# Patient Record
Sex: Female | Born: 1940 | Race: White | Hispanic: No | Marital: Married | Smoking: Never smoker
Health system: Southern US, Community
[De-identification: ages and names within clinical notes are randomized; demographics above are authoritative.]

## PROBLEM LIST (undated history)

## (undated) DIAGNOSIS — K219 Gastro-esophageal reflux disease without esophagitis: Secondary | ICD-10-CM

## (undated) DIAGNOSIS — M1990 Unspecified osteoarthritis, unspecified site: Secondary | ICD-10-CM

## (undated) DIAGNOSIS — E785 Hyperlipidemia, unspecified: Secondary | ICD-10-CM

## (undated) DIAGNOSIS — I10 Essential (primary) hypertension: Secondary | ICD-10-CM

## (undated) DIAGNOSIS — N6019 Diffuse cystic mastopathy of unspecified breast: Secondary | ICD-10-CM

## (undated) HISTORY — PX: BREAST EXCISIONAL BIOPSY: SUR124

## (undated) HISTORY — PX: BREAST BIOPSY: SHX20

## (undated) HISTORY — PX: EYE SURGERY: SHX253

## (undated) HISTORY — PX: APPENDECTOMY: SHX54

---

## 2004-10-16 ENCOUNTER — Ambulatory Visit: Payer: Self-pay | Admitting: Internal Medicine

## 2006-11-06 ENCOUNTER — Ambulatory Visit: Payer: Self-pay | Admitting: Internal Medicine

## 2006-11-28 ENCOUNTER — Ambulatory Visit: Payer: Self-pay | Admitting: Internal Medicine

## 2007-05-29 ENCOUNTER — Ambulatory Visit: Payer: Self-pay | Admitting: Internal Medicine

## 2008-05-30 ENCOUNTER — Ambulatory Visit: Payer: Self-pay | Admitting: Internal Medicine

## 2010-07-25 ENCOUNTER — Ambulatory Visit: Payer: Self-pay | Admitting: Internal Medicine

## 2011-08-16 ENCOUNTER — Ambulatory Visit: Payer: Self-pay | Admitting: Internal Medicine

## 2012-06-29 ENCOUNTER — Ambulatory Visit: Payer: Self-pay | Admitting: Internal Medicine

## 2012-07-09 ENCOUNTER — Ambulatory Visit: Payer: Self-pay | Admitting: Internal Medicine

## 2012-07-15 ENCOUNTER — Ambulatory Visit: Payer: Self-pay | Admitting: Internal Medicine

## 2012-08-20 ENCOUNTER — Ambulatory Visit: Payer: Self-pay | Admitting: Internal Medicine

## 2013-08-23 ENCOUNTER — Ambulatory Visit: Payer: Self-pay | Admitting: Family Medicine

## 2014-08-24 ENCOUNTER — Ambulatory Visit: Payer: Self-pay | Admitting: Family Medicine

## 2015-06-01 ENCOUNTER — Other Ambulatory Visit: Payer: Self-pay | Admitting: Unknown Physician Specialty

## 2015-06-01 DIAGNOSIS — M25362 Other instability, left knee: Secondary | ICD-10-CM

## 2015-06-01 DIAGNOSIS — M25361 Other instability, right knee: Secondary | ICD-10-CM

## 2015-06-01 DIAGNOSIS — M25561 Pain in right knee: Secondary | ICD-10-CM

## 2015-06-01 DIAGNOSIS — M25562 Pain in left knee: Principal | ICD-10-CM

## 2015-06-08 ENCOUNTER — Ambulatory Visit
Admission: RE | Admit: 2015-06-08 | Discharge: 2015-06-08 | Disposition: A | Discharge status: Home / Self Care | Payer: Medicare Other | Source: Ambulatory Visit | Attending: Unknown Physician Specialty | Admitting: Unknown Physician Specialty

## 2015-06-08 DIAGNOSIS — M1712 Unilateral primary osteoarthritis, left knee: Secondary | ICD-10-CM | POA: Diagnosis not present

## 2015-06-08 DIAGNOSIS — M23322 Other meniscus derangements, posterior horn of medial meniscus, left knee: Secondary | ICD-10-CM | POA: Insufficient documentation

## 2015-06-08 DIAGNOSIS — M1711 Unilateral primary osteoarthritis, right knee: Secondary | ICD-10-CM | POA: Diagnosis not present

## 2015-06-08 DIAGNOSIS — M7122 Synovial cyst of popliteal space [Baker], left knee: Secondary | ICD-10-CM | POA: Diagnosis not present

## 2015-06-08 DIAGNOSIS — M25562 Pain in left knee: Secondary | ICD-10-CM

## 2015-06-08 DIAGNOSIS — M25561 Pain in right knee: Secondary | ICD-10-CM | POA: Diagnosis present

## 2015-06-08 DIAGNOSIS — S83242A Other tear of medial meniscus, current injury, left knee, initial encounter: Secondary | ICD-10-CM | POA: Insufficient documentation

## 2015-06-08 DIAGNOSIS — M25361 Other instability, right knee: Secondary | ICD-10-CM

## 2015-06-08 DIAGNOSIS — M23300 Other meniscus derangements, unspecified lateral meniscus, right knee: Secondary | ICD-10-CM | POA: Diagnosis not present

## 2015-06-08 DIAGNOSIS — S83282A Other tear of lateral meniscus, current injury, left knee, initial encounter: Secondary | ICD-10-CM | POA: Diagnosis not present

## 2015-06-08 DIAGNOSIS — M25362 Other instability, left knee: Secondary | ICD-10-CM

## 2015-08-22 ENCOUNTER — Other Ambulatory Visit: Payer: Self-pay | Admitting: Family Medicine

## 2015-08-22 DIAGNOSIS — Z1231 Encounter for screening mammogram for malignant neoplasm of breast: Secondary | ICD-10-CM

## 2015-08-28 ENCOUNTER — Ambulatory Visit
Admission: RE | Admit: 2015-08-28 | Discharge: 2015-08-28 | Disposition: A | Discharge status: Home / Self Care | Payer: Medicare Other | Source: Ambulatory Visit | Attending: Family Medicine | Admitting: Family Medicine

## 2015-08-28 DIAGNOSIS — Z1231 Encounter for screening mammogram for malignant neoplasm of breast: Secondary | ICD-10-CM | POA: Diagnosis not present

## 2016-08-09 ENCOUNTER — Other Ambulatory Visit: Payer: Self-pay | Admitting: Family Medicine

## 2016-08-09 DIAGNOSIS — Z1231 Encounter for screening mammogram for malignant neoplasm of breast: Secondary | ICD-10-CM

## 2016-09-03 ENCOUNTER — Ambulatory Visit
Admission: RE | Admit: 2016-09-03 | Discharge: 2016-09-03 | Disposition: A | Discharge status: Home / Self Care | Payer: Medicare Other | Source: Ambulatory Visit | Attending: Family Medicine | Admitting: Family Medicine

## 2016-09-03 DIAGNOSIS — Z1231 Encounter for screening mammogram for malignant neoplasm of breast: Secondary | ICD-10-CM | POA: Diagnosis not present

## 2017-08-26 ENCOUNTER — Other Ambulatory Visit: Payer: Self-pay | Admitting: Family Medicine

## 2017-08-26 DIAGNOSIS — Z1231 Encounter for screening mammogram for malignant neoplasm of breast: Secondary | ICD-10-CM

## 2017-09-11 ENCOUNTER — Ambulatory Visit
Admission: RE | Admit: 2017-09-11 | Discharge: 2017-09-11 | Disposition: A | Discharge status: Home / Self Care | Payer: Medicare Other | Source: Ambulatory Visit | Attending: Family Medicine | Admitting: Family Medicine

## 2017-09-11 DIAGNOSIS — Z1231 Encounter for screening mammogram for malignant neoplasm of breast: Secondary | ICD-10-CM

## 2018-04-21 ENCOUNTER — Ambulatory Visit
Admission: RE | Admit: 2018-04-21 | Discharge: 2018-04-21 | Disposition: A | Discharge status: Home / Self Care | Payer: Medicare Other | Source: Ambulatory Visit | Attending: Internal Medicine | Admitting: Internal Medicine

## 2018-04-21 ENCOUNTER — Encounter
Admission: RE | Disposition: A | Discharge status: Home / Self Care | Payer: Self-pay | Source: Ambulatory Visit | Attending: Internal Medicine

## 2018-04-21 ENCOUNTER — Encounter: Payer: Self-pay | Admitting: *Deleted

## 2018-04-21 DIAGNOSIS — E785 Hyperlipidemia, unspecified: Secondary | ICD-10-CM | POA: Insufficient documentation

## 2018-04-21 DIAGNOSIS — Z882 Allergy status to sulfonamides status: Secondary | ICD-10-CM | POA: Diagnosis not present

## 2018-04-21 DIAGNOSIS — Z79899 Other long term (current) drug therapy: Secondary | ICD-10-CM | POA: Diagnosis not present

## 2018-04-21 DIAGNOSIS — I4891 Unspecified atrial fibrillation: Secondary | ICD-10-CM | POA: Diagnosis not present

## 2018-04-21 DIAGNOSIS — I10 Essential (primary) hypertension: Secondary | ICD-10-CM | POA: Diagnosis not present

## 2018-04-21 DIAGNOSIS — I639 Cerebral infarction, unspecified: Secondary | ICD-10-CM | POA: Insufficient documentation

## 2018-04-21 DIAGNOSIS — Z7982 Long term (current) use of aspirin: Secondary | ICD-10-CM | POA: Insufficient documentation

## 2018-04-21 DIAGNOSIS — K219 Gastro-esophageal reflux disease without esophagitis: Secondary | ICD-10-CM | POA: Diagnosis not present

## 2018-04-21 DIAGNOSIS — Z881 Allergy status to other antibiotic agents status: Secondary | ICD-10-CM | POA: Insufficient documentation

## 2018-04-21 HISTORY — DX: Essential (primary) hypertension: I10

## 2018-04-21 HISTORY — DX: Diffuse cystic mastopathy of unspecified breast: N60.19

## 2018-04-21 HISTORY — DX: Gastro-esophageal reflux disease without esophagitis: K21.9

## 2018-04-21 HISTORY — PX: TEE WITHOUT CARDIOVERSION: SHX5443

## 2018-04-21 HISTORY — DX: Hyperlipidemia, unspecified: E78.5

## 2018-04-21 HISTORY — DX: Unspecified osteoarthritis, unspecified site: M19.90

## 2018-04-21 SURGERY — ECHOCARDIOGRAM, TRANSESOPHAGEAL
Anesthesia: Moderate Sedation

## 2018-04-21 MED ORDER — LIDOCAINE VISCOUS HCL 2 % MT SOLN
OROMUCOSAL | Status: AC
  Filled 2018-04-21: qty 15

## 2018-04-21 MED ORDER — FENTANYL CITRATE (PF) 100 MCG/2ML IJ SOLN
INTRAMUSCULAR | Status: AC | PRN
  Administered 2018-04-21 (×2): 25 ug via INTRAVENOUS

## 2018-04-21 MED ORDER — MIDAZOLAM HCL 5 MG/5ML IJ SOLN
INTRAMUSCULAR | Status: AC | PRN
  Administered 2018-04-21: 2 mg via INTRAVENOUS

## 2018-04-21 MED ORDER — MIDAZOLAM HCL 5 MG/5ML IJ SOLN
INTRAMUSCULAR | Status: AC
  Filled 2018-04-21: qty 10

## 2018-04-21 MED ORDER — SODIUM CHLORIDE 0.9 % IV SOLN
INTRAVENOUS | Status: DC
  Administered 2018-04-21: 07:00:00 via INTRAVENOUS

## 2018-04-21 MED ORDER — FENTANYL CITRATE (PF) 100 MCG/2ML IJ SOLN
INTRAMUSCULAR | Status: AC
  Filled 2018-04-21: qty 4

## 2018-04-21 MED ORDER — SODIUM CHLORIDE FLUSH 0.9 % IV SOLN
INTRAVENOUS | Status: AC
  Filled 2018-04-21: qty 10

## 2018-04-21 MED ORDER — BUTAMBEN-TETRACAINE-BENZOCAINE 2-2-14 % EX AERO
INHALATION_SPRAY | CUTANEOUS | Status: AC
  Filled 2018-04-21: qty 5

## 2018-04-21 NOTE — Progress Notes (Signed)
*  PRELIMINARY RESULTS* Echocardiogram Echocardiogram Transesophageal has been performed.  Adriana Hughes, Adriana Hughes 04/21/2018, 8:18 AM

## 2018-04-21 NOTE — CV Procedure (Signed)
TEE Patient underwent transesophageal echocardiogram for reasons of cryptogenic stroke Patient had normal LV systolic function without evidence of significant valvular heart disease thrombus thickening calcification or other source of stroke.  The patient did not have any atrial septal defect or patent foramen ovale.  There was mild aortic atherosclerosis Patient tolerated medical treatment and procedure well without complication  Arnoldo HookerBruce Teddie Mehta

## 2018-06-11 ENCOUNTER — Ambulatory Visit: Payer: Medicare Other | Attending: Nurse Practitioner | Admitting: Speech Pathology

## 2018-06-11 ENCOUNTER — Encounter: Payer: Self-pay | Admitting: Speech Pathology

## 2018-06-11 ENCOUNTER — Encounter: Payer: Medicare Other | Admitting: Speech Pathology

## 2018-06-11 DIAGNOSIS — R490 Dysphonia: Secondary | ICD-10-CM

## 2018-06-11 DIAGNOSIS — R4701 Aphasia: Secondary | ICD-10-CM | POA: Diagnosis not present

## 2018-06-11 DIAGNOSIS — R262 Difficulty in walking, not elsewhere classified: Secondary | ICD-10-CM | POA: Insufficient documentation

## 2018-06-11 DIAGNOSIS — M6281 Muscle weakness (generalized): Secondary | ICD-10-CM | POA: Diagnosis present

## 2018-06-11 DIAGNOSIS — I63512 Cerebral infarction due to unspecified occlusion or stenosis of left middle cerebral artery: Secondary | ICD-10-CM | POA: Insufficient documentation

## 2018-06-13 NOTE — Therapy (Signed)
[facility] MAIN [facility] SERVICES [address] [place], [place], [address] Phone: [phone]   Fax:  [phone]  Speech Language Pathology Evaluation  Patient Details  Name: ALYZAE HAWKEY MRN: [id number] Date of Birth: 05/13/1941 Referring Provider: Clearence Cheek, NP   Encounter Date: 06/11/2018  End of Session - 06/13/18 0745    Visit Number  1    Number of Visits  17    Date for SLP Re-Evaluation  07/30/18    SLP Start Time  1600    SLP Stop Time   1655    SLP Time Calculation (min)  55 min    Activity Tolerance  Patient tolerated treatment well       Past Medical History:  Diagnosis Date  . Arthritis   . Fibrocystic breast disease   . GERD (gastroesophageal reflux disease)   . Hyperlipidemia   . Hypertension     Past Surgical History:  Procedure Laterality Date  . APPENDECTOMY    . BREAST BIOPSY    . BREAST EXCISIONAL BIOPSY Bilateral 1324,4010   benign  . EYE SURGERY    . TEE WITHOUT CARDIOVERSION N/A 04/21/2018   Procedure: TRANSESOPHAGEAL ECHOCARDIOGRAM (TEE);  Surgeon: Lamar Blinks, MD;  Location: [facility] ORS;  Service: Cardiovascular;  Laterality: N/A;    There were no vitals filed for this visit.  Subjective Assessment - 06/13/18 0725    Subjective  Patient reported she is doing well today, smiling often throughout evaluation    Patient is accompained by:  Family member    Currently in Pain?  No/denies         SLP Evaluation [facility] - 06/13/18 0725      SLP Visit Information   SLP Received On  06/13/18    Referring Provider  Clearence Cheek, NP    Onset Date  --   03/17/18   Medical Diagnosis  CVA      General Information   HPI  Per discharge summary from [facility]: Ischemic Stroke s/p alteplase and IA:  Presented initially with symptoms on arrival notable for left gaze deviation, right sided hemiparesis, hemianopia, and right facial droop. Primary stroke risk factors: hypertension. She was found to be a candidate  for tPa that she received on 5/15. She was also a candidate for IA and brought in for successful mechanical thrombectomy with good restoration of blood flow after one pass, TICI 3. Patient monitored in the ICU initially and required intubation due to frequent vomiting and concern for airway protection. She was successfully extubated on 5/16 and transferred to the floor without issues. Risk stratification obtained while inpatient as follows: LDL: 126, A1C: 5.8, TTE reveals EF >55% without evidence of intracardiac thrombus. 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 Post tPA [facility] 5/15 showed interval left basal ganglia hyperdensity, so repeat CTH ordered on 5/16 showing resolution of finding which likely represented contrast staining from IA procedure      Prior Functional Status   Cognitive/Linguistic Baseline  Within functional limits      Cognition   Overall Cognitive Status  Within Functional Limits for tasks assessed      Auditory Comprehension   Overall Auditory Comprehension  Appears within functional limits for tasks assessed    Yes/No Questions  Within Functional Limits    Commands  Within Functional Limits    Conversation  Moderately complex      Visual Recognition/Discrimination  Discrimination  Within Function Limits      Reading Comprehension   Reading Status  Within funtional limits   for tasks admininsterd     Expression   Primary Mode of Expression  Verbal      Verbal Expression   Overall Verbal Expression  Impaired    Initiation  No impairment    Automatic Speech  Name;Social Response;Counting    Level of Generative/Spontaneous Verbalization  Phrase;Sentence    Repetition  No impairment    Naming  Impairment    Responsive  76-100% accurate    Confrontation  75-100% accurate    Divergent  0-24% accurate    Other Naming Comments  Noted frequent semantic paraphasias in conversational speech and in  answers to wh questions    Verbal Errors  Semantic paraphasias;Inconsistent;Other (comment)   inconsistent self ID of errors   Pragmatics  No impairment    Effective Techniques  Semantic cues;Sentence completion      Written Expression   Written Expression  Exceptions to [employer]    Self Formulation Ability  Phrase    Interfering Components  Thought organization    Effective Techniques  Verbal/Language cues;Tactile/Visual cues    Overall Writen Expression  Moderate deficits with organization, missing articles misspelled words; word finding difficulties & semantic paraphasias evident in short phrases written to describe actions in "[employer]" picture      Oral Motor/Sensory Function   Overall Oral Motor/Sensory Function  Appears within functional limits for tasks assessed      Motor Speech   Overall Motor Speech  Impaired    Respiration  Impaired    Level of Impairment  Sentence    Phonation  Hoarse;Low vocal intensity    Resonance  Within functional limits    Articulation  Within functional limitis    Intelligibility  Intelligible    Motor Planning  Witnin functional limits      Standardized Assessments   Standardized Assessments   Western Aphasia Battery revised    Western Aphasia Battery revised   Aphasia Quotient 71.6       Western Aphasia Battery- Revised  Spontaneous Speech      Information content  8/10       Fluency   9/10     Comprehension     Yes/No questions  60/60        Auditory Word Recognition 60/60        Sequential Commands 80/80    Repetition   94/100     Naming    Object Naming  60/60        Word Fluency     4/20        Sentence Completion   10/10        Responsive Speech   10/10     Aphasia Quotient  71.6/100       SLP Education - 06/13/18 0743    Education Details  re: role of SLP in Aphasia treatment; word finding strategies, resonant voice exercises    Person(s) Educated  Patient;Child(ren)    Methods  Explanation;Verbal cues    Comprehension   Verbalized understanding;Need further instruction;Verbal cues required         SLP Long Term Goals - 06/13/18 0747      SLP LONG TERM GOAL #1   Title  Patient will describe pictured objects by naming 3 semantic features with 80% accuracy given min verbal cues.    Time  8    Period  Weeks  Status  New      SLP LONG TERM GOAL #2   Title  Patient will name 10 members in a given category with 80% accuracy given min verbal cues.    Time  8    Period  Weeks    Status  New      SLP LONG TERM GOAL #3   Title  Patient will compose a novel sentence when given 1-2 target words with 80% accuracy independently.    Time  8    Period  Weeks    Status  New      SLP LONG TERM GOAL #4   Title  Patient will give 3 semantic descriptors when having word finding difficulty in conversation with 100% accuracy given min verbal cues.    Time  8    Period  Weeks    Status  New      SLP LONG TERM GOAL #5   Title  Patient will be independent with resonant voice exercises.    Time  8    Period  Weeks    Status  New       Plan - 06/13/18 0746    Clinical Impression Statement  Patient presents with moderate expressive aphasia characterized by word finding and organization deficits at the phrase level. Spontaneous speech composed of largely phrases or short sentences with intermittent semantic paraphasias. Patient exhibits no difficulty with confrontation naming tasks presented on evaluation; however word finding deficits become evident in response to open ended questions, divergent naming tasks and in simple written expression tasks. Receptive language appears WFL. No evidence of dysarthria or verbal apraxia. 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 Patient and daughter report onset of dysphonia s/p CVA with no prior history of dysphonia.  Recommend ENT evaluation  to further investigate dysphonia if dysphonia does not resolve with treatment.   Speech Therapy Frequency  2x / week    Duration  Other (comment)   8 weeks   Treatment/Interventions  Compensatory strategies;SLP instruction and feedback;Patient/family education;Functional tasks    Potential to Achieve Goals  Good    Potential Considerations  Ability to learn/carryover information;Family/community support;Cooperation/participation level    SLP Home Exercise Plan  Word finding and resonant voice exercises    Consulted and Agree with Plan of Care  Patient;Family member/caregiver    Family Member Consulted  Daughter       Patient will benefit from skilled therapeutic intervention in order to improve the following deficits and impairments:   Expressive aphasia  Dysphonia    Problem List There are no active problems to display for this patient.   [place], MA, CCC-SLP 06/13/2018, 8:09 AM  [facility] MAIN [facility] SERVICES [address] [place], [place], [address] Phone: [phone]   Fax:  [phone]  Name: VERMELLE CAMMARATA MRN: [id number] Date of Birth: [birth year]

## 2018-06-15 ENCOUNTER — Ambulatory Visit: Payer: Medicare Other

## 2018-06-15 ENCOUNTER — Encounter: Payer: Self-pay | Admitting: Physical Therapy

## 2018-06-15 ENCOUNTER — Other Ambulatory Visit: Payer: Self-pay

## 2018-06-15 ENCOUNTER — Ambulatory Visit: Payer: Medicare Other | Admitting: Physical Therapy

## 2018-06-15 ENCOUNTER — Ambulatory Visit: Payer: Medicare Other | Admitting: Speech Pathology

## 2018-06-15 DIAGNOSIS — R4701 Aphasia: Secondary | ICD-10-CM

## 2018-06-15 DIAGNOSIS — M6281 Muscle weakness (generalized): Secondary | ICD-10-CM

## 2018-06-15 DIAGNOSIS — R262 Difficulty in walking, not elsewhere classified: Secondary | ICD-10-CM

## 2018-06-15 NOTE — Patient Instructions (Signed)
Chair Pose    Stand with legs hip-width apart. Inhaling, bend knees trying to keep knees over ankles, as if to sit on a chair and extend arms in front, wrists slightly lower than shoulders. Hold position for _3__ breaths. Exhaling, stand up. Repeat _15__ times. Do _2__ times per day.  Copyright  VHI. All rights reserved.  Heel Raises    Stand with support. Tighten pelvic floor and hold. With knees straight, raise heels off ground. Repeat _20__ times. Do _2__ times a day.  Copyright  VHI. All rights reserved.  Hip Extension: Standing (Single Leg)    In shoulder width stance, anchor tubing under one foot. Twist and put around other ankle. Pull same leg back, keeping knee nearly straight. Repeat 15__ times per set. Repeat with other leg. Do _2_ sets per session. Do 7__ sessions per week.  http://tub.exer.us/194   Copyright  VHI. All rights reserved.

## 2018-06-15 NOTE — Therapy (Signed)
[facility] [address] [place], [place], [address] Phone: [phone]   Fax:  [phone]  Physical Therapy Evaluation  Patient Details  Name: Adriana Hughes MRN: [id number] Date of Birth: 1941-02-08 Referring Provider: Clearence CheekWILSON, SUSAN    Encounter Date: 06/15/2018  PT End of Session - 06/15/18 1658    Visit Number  1    Number of Visits  25    Date for PT Re-Evaluation  09/07/18    PT Start Time  0400    PT Stop Time  0455    PT Time Calculation (min)  55 min    Equipment Utilized During Treatment  Gait belt    Activity Tolerance  Patient tolerated treatment well;Patient limited by fatigue    Behavior During Therapy  [facility]FL for tasks assessed/performed       Past Medical History:  Diagnosis Date  . Arthritis   . Fibrocystic breast disease   . GERD (gastroesophageal reflux disease)   . Hyperlipidemia   . Hypertension     Past Surgical History:  Procedure Laterality Date  . APPENDECTOMY    . BREAST BIOPSY    . BREAST EXCISIONAL BIOPSY Bilateral [id number]   benign  . EYE SURGERY    . TEE WITHOUT CARDIOVERSION N/A 04/21/2018   Procedure: TRANSESOPHAGEAL ECHOCARDIOGRAM (TEE);  Surgeon: Lamar BlinksKowalski, Bruce J, MD;  Location: [facility] ORS;  Service: Cardiovascular;  Laterality: N/A;    There were no vitals filed for this visit.   Subjective Assessment - 06/15/18 1605    Subjective  Patient reports that she has decreasaed endurance and walks towards the right side.     Patient is accompained by:  Family member    Pertinent History  [facility]schem[facility] Stroke s/p alteplase and IA:  Presented initially with symptoms on arrival notable for left gaze deviation, right sided hemiparesis, hemianopia, and right facial droop. Primary stroke risk factors: hypertension. She was found to be a candidate for tPa that she received on 5/15. She was also a candidate for IA and brought in for successful mechanical  thrombectomy with good restoration of blood flow after one pass, TICI 3. Patient monitored in the ICU initially and required intubation due to frequent vomiting and concern for airway protection. She was successfully extubated on 5/16 and transferred to the floor without issues. Risk stratification obtained while inpatient as follows: LDL: 126, A1C: 5.8, TTE reveals EF >55% without evidence of intracardiac thrombus. 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 [facility] showed interval left basal ganglia hyperdensity, so repeat CTH ordered on 5/16 showing resolution of finding which likely represented contrast staining from IA procedure     How long can you stand comfortably?  no limits    Patient Stated Goals  Patient wants to be able to walk longer distances and not walk towards the right side.     Currently in Pain?  No/denies    Multiple Pain Sites  No         [facility] PT Assessment - 06/15/18 1607      Assessment   Medical Diagnosis  cva    Referring Provider  Andrey CampanileWILSON, [place]USAN     Onset Date/Surgical Date  03/17/18    Hand Dominance  Right    Prior Therapy  HHPT      Precautions   Precautions  None      Restrictions  Weight Bearing Restrictions  No      Balance Screen   Has the patient fallen in the past 6 months  No    Has the patient had a decrease in activity level because of a fear of falling?   Yes    Is the patient reluctant to leave their home because of a fear of falling?   No      Home Environment   Living Environment  Private residence    Living Arrangements  Spouse/significant other    Type of Home  House    Home Access  Stairs to enter    Entrance Stairs-Number of Steps  3    Entrance Stairs-Rails  Left    Home Equipment  Hand held shower head      Prior Function   Level of Independence  Independent with basic ADLs    Vocation  Retired    Leisure  --   wat,ch grand children, movies         POSTURE: WNL   PROM/AROM: WNL  STRENGTH:  Graded on a 0-5 scale Muscle Group Left Right                          Hip Flex 4/5 3+/5  Hip Abd 4/5 3+/5  Hip Add 3/5 2+/5  Hip Ext -2/5 -2/5  Hip IR/ER 3/5 3/5  Knee Flex 4/5 4/5  Knee Ext 4/5 4/5  Ankle DF 5/5 4/5  Ankle PF 4/5 3/5   SENSATION: WNL   FUNCTIONAL MOBILITY:independent transfers sit to stand ; independent bed mobility    BALANCE: Static Standing Balance  Normal Able to maintain standing balance against maximal resistance   Good Able to maintain standing balance against moderate resistance   Good-/Fair+ Able to maintain standing balance against minimal resistance x  Fair Able to stand unsupported without UE support and without LOB for 1-2 min   Fair- Requires Min A and UE support to maintain standing without loss of balance   Poor+ Requires mod A and UE support to maintain standing without loss of balance   Poor Requires max A and UE support to maintain standing balance without loss    Standing Dynamic Balance  Normal Stand independently unsupported, able to weight shift and cross midline maximally   Good Stand independently unsupported, able to weight shift and cross midline moderately   Good-/Fair+ Stand independently unsupported, able to weight shift across midline minimally x  Fair Stand independently unsupported, weight shift, and reach ipsilaterally, loss of balance when crossing midline   Poor+ Able to stand with Min A and reach ipsilaterally, unable to weight shift   Poor Able to stand with Mod A and minimally reach ipsilaterally, unable to cross midline.       GAIT: Patient ambulates with right path deviation with slow gait speed and no assistive device with feet crossing x 2 in 6 minutes  OUTCOME MEASURES: TEST Outcome Interpretation  5 times sit<>stand 14.84sec >60 yo, >15 sec indicates increased risk for falls  10 meter walk test   .82              m/s <1.0 m/s indicates increased risk  for falls; limited community ambulator  Timed up and Go      9.88           sec <14 sec indicates increased risk for falls  6 minute walk test 885  Feet 1000 feet is community ambulator              Treatment: Heel raises bilaterally x 20 x 2 Single R heel raise x 10  Standing hip extension with OTB x 15 BLE Squats x 15 with 5 sec hold Patient given verbal instructions for HEP        Objective measurements completed on examination: See above findings.              PT Education - 06/15/18 1611    Education Details  plan of care    Person(s) Educated  Patient;Child(ren)    Methods  Explanation    Comprehension  Verbalized understanding       PT Short Term Goals - 06/16/18 1610      PT SHORT TERM GOAL #1   Title  Patient will be independent in home exercise program to improve strength/mobility for better functional independence with ADLs.    Time  6    Period  Weeks    Status  New    Target Date  07/28/18      PT SHORT TERM GOAL #2   Title  Patient will increase six minute walk test distance to >1000 for progression to community ambulator and improve gait ability    Baseline  585 ft    Time  6    Period  Weeks    Status  New    Target Date  07/28/18        PT Long Term Goals - 06/16/18 0810      PT LONG TERM GOAL #1   Title  Patient will increase 10 meter walk test to >1.5m/s as to improve gait speed for better community ambulation and to reduce fall risk.    Time  12    Period  Weeks    Status  New    Target Date  09/08/18      PT LONG TERM GOAL #2   Title  Patient will ascend/descend 4 stairs without rail assist independently without loss of balance to improve ability to get in/out of home.     Baseline  needs rail    Time  12    Period  Weeks    Status  New    Target Date  09/08/18      PT LONG TERM GOAL #3   Title   Patient will demonstrate an improved Berg Balance Score of > as to demonstrate improved balance with ADLs  such as sitting/standing and transfer balance and reduced fall risk    Time  12    Period  Weeks    Status  New    Target Date  09/08/18      PT LONG TERM GOAL #4   Title  Patient will increase BLE gross strength to 4+/5 as to improve functional strength for independent gait, increased standing tolerance and increased ADL ability.    Time  12    Period  Weeks    Status  New    Target Date  09/08/18             Plan - 06/15/18 1659    Clinical Impression Statement  Patient is 77 yr old female with recent CVA. She has weakness in R ankle DF, PF, right hip , decreased gait speed with deviation to right , decreased static and dynamic standing balnace , with outcome measures indicating a falls risk. She performs HEp with written instructions. She will benefit from  skilled PT to improve strength and balance and mobility.     Clinical Presentation  Stable    Clinical Decision Making  Low    Rehab Potential  Good    PT Frequency  2x / week    PT Duration  12 weeks    PT Treatment/Interventions  Manual techniques;Patient/family education;Balance training;Functional mobility training;Therapeutic activities;Therapeutic exercise;Gait training;Aquatic Therapy       Patient will benefit from skilled therapeutic intervention in order to improve the following deficits and impairments:  Abnormal gait, Decreased balance, Decreased endurance, Decreased mobility, Difficulty walking, Decreased activity tolerance, Decreased safety awareness, Decreased strength  Visit Diagnosis: Difficulty in walking, not elsewhere classified  Muscle weakness (generalized)     Problem List There are no active problems to display for this patient.   [address] , [place] DPT 06/16/2018, 8:20 AM  [place] [facility] MAIN [facility] SERVICES [address] [place], [place], [address] Phone: [phone]   Fax:  [phone]  Name: Adriana Hughes MRN: [id number] Date of Birth:  October 06, 1941

## 2018-06-16 ENCOUNTER — Encounter: Payer: Self-pay | Admitting: Speech Pathology

## 2018-06-16 NOTE — Therapy (Signed)
[place] [facility] [address] [place], [place], [address] Phone: [phone]   Fax:  [phone]  Speech Language Pathology Treatment  Patient Details  Name: Adriana GrapesSue R Hughes MRN: [id number] Date of Birth: 01-18-1941 Referring Provider: Clearence CheekSusan Wilson, NP   Encounter Date: 06/15/2018  End of Session - 06/16/18 1413    Visit Number  2    Number of Visits  17    Date for SLP Re-Evaluation  07/30/18    SLP Start Time  1515   Pt called to say she was running late   SLP Stop Time   1559    SLP Time Calculation (min)  44 min    Activity Tolerance  Patient tolerated treatment well;Patient limited by fatigue       Past Medical History:  Diagnosis Date  . Arthritis   . Fibrocystic breast disease   . GERD (gastroesophageal reflux disease)   . Hyperlipidemia   . Hypertension     Past Surgical History:  Procedure Laterality Date  . APPENDECTOMY    . BREAST BIOPSY    . BREAST EXCISIONAL BIOPSY Bilateral [id number]   benign  . EYE SURGERY    . TEE WITHOUT CARDIOVERSION N/A 04/21/2018   Procedure: TRANSESOPHAGEAL ECHOCARDIOGRAM (TEE);  Surgeon: Lamar BlinksKowalski, Bruce J, MD;  Location: [facility] ORS;  Service: Cardiovascular;  Laterality: N/A;    There were no vitals filed for this visit.  Subjective Assessment - 06/16/18 1410    Subjective  Patient reported she is doing well todayl            ADULT SLP TREATMENT - 06/16/18 0001      General Information   Behavior/Cognition  Alert;Cooperative;Pleasant mood    Patient Positioning  Upright in chair    HPI  [facility]schemic Stroke s/p alteplase and IA:  Presented initially with symptoms on arrival notable for left gaze deviation, right sided hemiparesis, hemianopia, and right facial droop. Primary stroke risk factors: hypertension. She was found to be a candidate for tPa that she received on 5/15. She was also a candidate for IA and brought in for successful  mechanical thrombectomy with good restoration of blood flow after one pass, TICI 3. Patient monitored in the ICU initially and required intubation due to frequent vomiting and concern for airway protection. She was successfully extubated on 5/16 and transferred to the floor without issues. Risk stratification obtained while inpatient as follows: LDL: 126, A1C: 5.8, TTE reveals EF >55% without evidence of intracardiac thrombus. 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 [facility] showed interval left basal ganglia hyperdensity, so repeat CTH ordered on 5/16 showing resolution of finding which likely represented contrast staining from IA procedure       Treatment Provided   Treatment provided  Cognitive-Linquistic      Pain Assessment   Pain Assessment  No/denies pain      Cognitive-Linquistic Treatment   Treatment focused on  Aphasia    Skilled Treatment Patient named 3 semantic features given pictured objects with 100% accuracy given moderate verbal cues.   Patient named 10 members in a given category with 100% accuracy given mod verbal cues.  Given a written description, patient named item described with 90% accuracy given min verbal cues. Noted written phonemic paraphasias when attempting to write names of items described. Patient required written cues to correct spelling.  Assessment / Recommendations / Plan   Plan  Continue with current plan of care      Progression Toward Goals   Progression toward goals  Progressing toward goals       SLP Education - 06/16/18 1412    Education Details  Word finding strategies    Person(s) Educated  Patient    Methods  Explanation;Demonstration;Verbal cues    Comprehension  Verbalized understanding;Returned demonstration;Verbal cues required;Need further instruction         SLP Long Term Goals - 06/13/18 0747      SLP LONG TERM GOAL #1   Title  Patient will  describe pictured objects by naming 3 semantic features with 80% accuracy given min verbal cues.    Time  8    Period  Weeks    Status  New      SLP LONG TERM GOAL #2   Title  Patient will name 10 members in a given category with 80% accuracy given min verbal cues.    Time  8    Period  Weeks    Status  New      SLP LONG TERM GOAL #3   Title  Patient will compose a novel sentence when given 1-2 target words with 80% accuracy independently.    Time  8    Period  Weeks    Status  New      SLP LONG TERM GOAL #4   Title  Patient will give 3 semantic descriptors when having word finding difficulty in conversation with 100% accuracy given min verbal cues.    Time  8    Period  Weeks    Status  New      SLP LONG TERM GOAL #5   Title  Patient will be independent with resonant voice exercises.    Time  8    Period  Weeks    Status  New       Plan - 06/16/18 1413    Clinical Impression Statement  Patient completed word finding tasks with accuracy given min to mod verbal cues. Noted written paraphasias that were not seen on evaluation. Patient corrected paraphasias accurately given written cues. Upon arriving to treatement today, patient presented with improved vocal intensity and decreased hoarseness.     Speech Therapy Frequency  2x / week    Duration  Other (comment)   8 weeks   Treatment/Interventions  Compensatory strategies;SLP instruction and feedback;Patient/family education;Functional tasks    Potential to Achieve Goals  Good    Potential Considerations  Ability to learn/carryover information;Family/community support;Cooperation/participation level    SLP Home Exercise Plan  Word finding and resonant voice exercises    Consulted and Agree with Plan of Care  Patient       Patient will benefit from skilled therapeutic intervention in order to improve the following deficits and impairments:   Aphasia    Problem List There are no active problems to display for this  patient.   [place]ak,Elizabth Palka, MA, CCC-SLP 06/16/2018, 2:24 PM  [facility] [address] [place], [place], [address] Phone: [phone]   Fax:  [phone]   Name: Adriana GrapesSue R Hughes MRN: [id number] Date of Birth: 25-Aug-1941

## 2018-06-16 NOTE — Addendum Note (Signed)
Addended by: Ezekiel InaMANSFIELD, KRISTINE S on: 06/16/2018 09:40 AM   Modules accepted: Orders

## 2018-06-18 ENCOUNTER — Ambulatory Visit: Payer: Medicare Other | Admitting: Physical Therapy

## 2018-06-18 ENCOUNTER — Encounter: Payer: Self-pay | Admitting: Physical Therapy

## 2018-06-18 DIAGNOSIS — R4701 Aphasia: Secondary | ICD-10-CM | POA: Diagnosis not present

## 2018-06-18 DIAGNOSIS — M6281 Muscle weakness (generalized): Secondary | ICD-10-CM

## 2018-06-18 DIAGNOSIS — R262 Difficulty in walking, not elsewhere classified: Secondary | ICD-10-CM

## 2018-06-18 NOTE — Therapy (Signed)
[place] [facility] [address] [place], [place], [address] Phone: [phone]   Fax:  [phone]  Physical Therapy Treatment  Patient Details  Name: Adriana GrapesSue R Hughes MRN: [id number] Date of Birth: [birth year] Referring Provider: Clearence CheekWILSON, SUSAN    Encounter Date: 06/18/2018  PT End of Session - 06/18/18 1444    Visit Number  2    Number of Visits  25    Date for PT Re-Evaluation  09/07/18    PT Start Time  0235    PT Stop Time  0315    PT Time Calculation (min)  40 min    Equipment Utilized During Treatment  Gait belt    Activity Tolerance  Patient tolerated treatment well;Patient limited by fatigue    Behavior During Therapy  [facility] for tasks assessed/performed       Past Medical History:  Diagnosis Date  . Arthritis   . Fibrocystic breast disease   . GERD (gastroesophageal reflux disease)   . Hyperlipidemia   . Hypertension     Past Surgical History:  Procedure Laterality Date  . APPENDECTOMY    . BREAST BIOPSY    . BREAST EXCISIONAL BIOPSY Bilateral [id number]   benign  . EYE SURGERY    . TEE WITHOUT CARDIOVERSION N/A 04/21/2018   Procedure: TRANSESOPHAGEAL ECHOCARDIOGRAM (TEE);  Surgeon: Lamar BlinksKowalski, Bruce J, MD;  Location: [facility] ORS;  Service: Cardiovascular;  Laterality: N/A;    There were no vitals filed for this visit.  Subjective Assessment - 06/18/18 1443    Subjective  Patient reports that she has decreasaed endurance and walks towards the right side. no pain reports.    Patient is accompained by:  Family member    Pertinent History  [facility] Ischemic Stroke s/p alteplase and IA:  Presented initially with symptoms on arrival notable for left gaze deviation, right sided hemiparesis, hemianopia, and right facial droop. Primary stroke risk factors: hypertension. She was found to be a candidate for tPa that she received on 5/15. She was also a candidate for IA and brought in for successful  mechanical thrombectomy with good restoration of blood flow after one pass, TICI 3. Patient monitored in the ICU initially and required intubation due to frequent vomiting and concern for airway protection. She was successfully extubated on 5/16 and transferred to the floor without issues. Risk stratification obtained while inpatient as follows: LDL: 126, A1C: 5.8, TTE reveals EF >55% without evidence of intracardiac thrombus. 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 [facility] showed interval left basal ganglia hyperdensity, so repeat CTH ordered on 5/16 showing resolution of finding which likely represented contrast staining from IA procedure     How long can you stand comfortably?  no limits    Patient Stated Goals  Patient wants to be able to walk longer distances and not walk towards the right side.     Currently in Pain?  No/denies    Multiple Pain Sites  No          Therapeutic exercise and neuromuscular training:  1/2 foam flat side up and balance with head turns left and right feet apart and feet together,; cues for keeping balance and using ankle and hips to maintain center of gravity   standing on 1/2 foam trunk rotation left and right   ; cues to maintain balance and posture correction  side stepping left and  right in parallel bars 10 feet x 3; rotation cues for upright posture and not rotating hips towards the direction of motion  step ups from floor to 6 inch stool x 20 bilateral; cues to try not to use UE and to keep correct head position  Standing on foam and tapping towards disc alternation LE's x 20   Berg balance test 49/56  Patient needs occasional verbal cueing to improve posture and cueing to correctly perform exercises slowly, holding at end of range to increase motor firing of desired muscle to encourage fatigue.                      PT Education - 06/18/18 1443     Education Details  HEP    Person(s) Educated  Patient    Methods  Explanation;Demonstration    Comprehension  Verbalized understanding;Returned demonstration;Verbal cues required       PT Short Term Goals - 06/16/18 0808      PT SHORT TERM GOAL #1   Title  Patient will be independent in home exercise program to improve strength/mobility for better functional independence with ADLs.    Time  6    Period  Weeks    Status  New    Target Date  07/28/18      PT SHORT TERM GOAL #2   Title  Patient will increase six minute walk test distance to >1000 for progression to community ambulator and improve gait ability    Baseline  585 ft    Time  6    Period  Weeks    Status  New    Target Date  07/28/18        PT Long Term Goals - 06/16/18 0810      PT LONG TERM GOAL #1   Title  Patient will increase 10 meter walk test to >1.35m/s as to improve gait speed for better community ambulation and to reduce fall risk.    Time  12    Period  Weeks    Status  New    Target Date  09/08/18      PT LONG TERM GOAL #2   Title  Patient will ascend/descend 4 stairs without rail assist independently without loss of balance to improve ability to get in/out of home.     Baseline  needs rail    Time  12    Period  Weeks    Status  New    Target Date  09/08/18      PT LONG TERM GOAL #3   Title   Patient will demonstrate an improved Berg Balance Score of > as to demonstrate improved balance with ADLs such as sitting/standing and transfer balance and reduced fall risk    Time  12    Period  Weeks    Status  New    Target Date  09/08/18      PT LONG TERM GOAL #4   Title  Patient will increase BLE gross strength to 4+/5 as to improve functional strength for independent gait, increased standing tolerance and increased ADL ability.    Time  12    Period  Weeks    Status  New    Target Date  09/08/18            Plan - 06/18/18 1444    Clinical Impression Statement  Patient instructed in  advanced dynamic and static balance exercise. Utilized stable and uneven surfaces to further challenge  dynamic balance. Patient required min Vcs for correct positioning and exercise technique! Patient had a difficult time during narrow base of support challenges.  He exhibits better SLS ability being able to progress to foam; Patient would benefit from additional skilled PT Intervention to improve strength, balance and gait safety    Rehab Potential  Good    PT Frequency  2x / week    PT Duration  12 weeks    PT Treatment/Interventions  Manual techniques;Patient/family education;Balance training;Functional mobility training;Therapeutic activities;Therapeutic exercise;Gait training;Aquatic Therapy       Patient will benefit from skilled therapeutic intervention in order to improve the following deficits and impairments:  Abnormal gait, Decreased balance, Decreased endurance, Decreased mobility, Difficulty walking, Decreased activity tolerance, Decreased safety awareness, Decreased strength  Visit Diagnosis: Difficulty in walking, not elsewhere classified  Muscle weakness (generalized)     Problem List There are no active problems to display for this patient.   [address], Haskell Rihn S, [place] DPT 06/18/2018, 2:46 PM  [place] [facility] [address] [place], [place], [address] Phone: [phone]   Fax:  [phone]  Name: Adriana GrapesSue R Calixte MRN: [id number] Date of Birth: [birth year]

## 2018-06-22 ENCOUNTER — Encounter: Payer: Self-pay | Admitting: Speech Pathology

## 2018-06-22 ENCOUNTER — Encounter: Payer: Self-pay | Admitting: Physical Therapy

## 2018-06-22 ENCOUNTER — Ambulatory Visit: Payer: Medicare Other | Admitting: Speech Pathology

## 2018-06-22 ENCOUNTER — Ambulatory Visit: Payer: Medicare Other | Admitting: Physical Therapy

## 2018-06-22 DIAGNOSIS — R4701 Aphasia: Secondary | ICD-10-CM | POA: Diagnosis not present

## 2018-06-22 DIAGNOSIS — R262 Difficulty in walking, not elsewhere classified: Secondary | ICD-10-CM

## 2018-06-22 DIAGNOSIS — M6281 Muscle weakness (generalized): Secondary | ICD-10-CM

## 2018-06-22 NOTE — Therapy (Signed)
[facility] [address] [place], [place], [address] Phone: [phone]   Fax:  [phone]  Physical Therapy Treatment  Patient Details  Name: Adriana Hughes MRN: [id number] Date of Birth: Aug 18, 1941 Referring Provider: Clearence CheekWILSON, SUSAN    Encounter Date: 06/22/2018  PT End of Session - 06/22/18 1536    Visit Number  3    Number of Visits  25    Date for PT Re-Evaluation  09/07/18    PT Start Time  0330    PT Stop Time  0400    PT Time Calculation (min)  30 min    Equipment Utilized During Treatment  Gait belt    Activity Tolerance  Patient tolerated treatment well;Patient limited by fatigue    Behavior During Therapy  [facility]WFL for tasks assessed/performed       Past Medical History:  Diagnosis Date  . Arthritis   . Fibrocystic breast disease   . GERD (gastroesophageal reflux disease)   . Hyperlipidemia   . Hypertension     Past Surgical History:  Procedure Laterality Date  . APPENDECTOMY    . BREAST BIOPSY    . BREAST EXCISIONAL BIOPSY Bilateral [id number]   benign  . EYE SURGERY    . TEE WITHOUT CARDIOVERSION N/A 04/21/2018   Procedure: TRANSESOPHAGEAL ECHOCARDIOGRAM (TEE);  Surgeon: Lamar BlinksKowalski, Bruce J, MD;  Location: [facility] ORS;  Service: Cardiovascular;  Laterality: N/A;    There were no vitals filed for this visit.  Subjective Assessment - 06/22/18 1536    Subjective  Patient reports that she has decreasaed endurance and walks towards the right side. no pain reports.    Patient is accompained by:  Family member    Pertinent History  [facility] Stroke s/p alteplase and IA:  Presented initially with symptoms on arrival notable for left gaze deviation, right sided hemiparesis, hemianopia, and right facial droop. Primary stroke risk factors: hypertension. She was found to be a candidate for tPa that she received on 5/15. She was also a candidate for IA and brought in for successful  mechanical thrombectomy with good restoration of blood flow after one pass, TICI 3. Patient monitored in the ICU initially and required intubation due to frequent vomiting and concern for airway protection. She was successfully extubated on 5/16 and transferred to the floor without issues. Risk stratification obtained while inpatient as follows: LDL: 126, A1C: 5.8, TTE reveals EF >55% without evidence of intracardiac thrombus. 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 [facility] showed interval left basal ganglia hyperdensity, so repeat CTH ordered on 5/16 showing resolution of finding which likely represented contrast staining from IA procedure     How long can you stand comfortably?  no limits    Patient Stated Goals  Patient wants to be able to walk longer distances and not walk towards the right side.     Currently in Pain?  No/denies    Multiple Pain Sites  No         NEUROMUSCULAR RE-EDUCATION 1/2 foam flat side up and balance with head turns left and right feet apart and feet together,   tandem standing on 1/2 foam  with 50% use of UE support, tandem standing on floor with 25% use of UE support  side stepping left and right in parallel bars 10 feet x 3  Stepping over hurdle fwd / bwd with  10 % UE support   Tilt board fwd/bwd, side to side left and right, 100 % UE support r Patient needs occasional verbal cueing to improve posture and cueing to correctly perform exercises slowly, holding at end of range to increase motor firing of desired muscle to encourage fatigue                    PT Education - 06/22/18 1536    Education Details  HEP    Person(s) Educated  Patient    Methods  Explanation;Demonstration    Comprehension  Verbalized understanding;Returned demonstration       PT Short Term Goals - 06/16/18 0808      PT SHORT TERM GOAL #1   Title  Patient will be independent in home  exercise program to improve strength/mobility for better functional independence with ADLs.    Time  6    Period  Weeks    Status  New    Target Date  07/28/18      PT SHORT TERM GOAL #2   Title  Patient will increase six minute walk test distance to >1000 for progression to community ambulator and improve gait ability    Baseline  585 ft    Time  6    Period  Weeks    Status  New    Target Date  07/28/18        PT Long Term Goals - 06/16/18 0810      PT LONG TERM GOAL #1   Title  Patient will increase 10 meter walk test to >1.4356m/s as to improve gait speed for better community ambulation and to reduce fall risk.    Time  12    Period  Weeks    Status  New    Target Date  09/08/18      PT LONG TERM GOAL #2   Title  Patient will ascend/descend 4 stairs without rail assist independently without loss of balance to improve ability to get in/out of home.     Baseline  needs rail    Time  12    Period  Weeks    Status  New    Target Date  09/08/18      PT LONG TERM GOAL #3   Title   Patient will demonstrate an improved Berg Balance Score of > as to demonstrate improved balance with ADLs such as sitting/standing and transfer balance and reduced fall risk    Time  12    Period  Weeks    Status  New    Target Date  09/08/18      PT LONG TERM GOAL #4   Title  Patient will increase BLE gross strength to 4+/5 as to improve functional strength for independent gait, increased standing tolerance and increased ADL ability.    Time  12    Period  Weeks    Status  New    Target Date  09/08/18            Plan - 06/22/18 1537    Clinical Impression Statement  Patient demonstrates deficits with postural control in tandem and narrow stance on purple foam. Patient demonstrated hesitation with full weight shifting and on uneven surfaces but minimal cueing resulted in good technique and no LOB.  Patient improved ability to challenge dynamic balance with supervision and with UE assist  today.   Patient will continue to benefit from skilled physical therapy to improve endurance and dynamic balance  to reduce fall risk    Rehab Potential  Good    PT Frequency  2x / week    PT Duration  12 weeks    PT Treatment/Interventions  Manual techniques;Patient/family education;Balance training;Functional mobility training;Therapeutic activities;Therapeutic exercise;Gait training;Aquatic Therapy       Patient will benefit from skilled therapeutic intervention in order to improve the following deficits and impairments:  Abnormal gait, Decreased balance, Decreased endurance, Decreased mobility, Difficulty walking, Decreased activity tolerance, Decreased safety awareness, Decreased strength  Visit Diagnosis: Difficulty in walking, not elsewhere classified  Muscle weakness (generalized)     Problem List There are no active problems to display for this patient.   [address], [place] DPT 06/22/2018, 4:01 PM  [place] [facility] MAIN [facility] SERVICES [address] [place], [place], [address] Phone: [phone]   Fax:  [phone]  Name: Adriana Hughes MRN: [id number] Date of Birth: March 01, 1941

## 2018-06-22 NOTE — Therapy (Signed)
[facility] MAIN [facility] [address] [place], [place], [address] Phone: [phone]   Fax:  [phone]  Speech Language Pathology Treatment  Patient Details  Name: Adriana Hughes MRN: [id number] Date of Birth: 28-Feb-1941 Referring Provider: Clearence CheekSusan Wilson, NP   Encounter Date: 06/22/2018  End of Session - 06/22/18 1658    Visit Number  3    Number of Visits  17    Date for SLP Re-Evaluation  07/30/18    SLP Start Time  1600    SLP Stop Time   1648    SLP Time Calculation (min)  48 min    Activity Tolerance  Patient tolerated treatment well       Past Medical History:  Diagnosis Date  . Arthritis   . Fibrocystic breast disease   . GERD (gastroesophageal reflux disease)   . Hyperlipidemia   . Hypertension     Past Surgical History:  Procedure Laterality Date  . APPENDECTOMY    . BREAST BIOPSY    . BREAST EXCISIONAL BIOPSY Bilateral [id number]   benign  . EYE SURGERY    . TEE WITHOUT CARDIOVERSION N/A 04/21/2018   Procedure: TRANSESOPHAGEAL ECHOCARDIOGRAM (TEE);  Surgeon: Lamar BlinksKowalski, Bruce J, MD;  Location: [facility] ORS;  Service: Cardiovascular;  Laterality: N/A;    There were no vitals filed for this visit.  Subjective Assessment - 06/22/18 1657    Subjective  Patient reported she is doing well today and smiled frequently throughout treatment.    Currently in Pain?  No/denies            ADULT SLP TREATMENT - 06/22/18 0001      General Information   Behavior/Cognition  Alert;Cooperative;Pleasant mood    Patient Positioning  Upright in chair    HPI  [facility] Stroke s/p alteplase and IA:  Presented initially with symptoms on arrival notable for left gaze deviation, right sided hemiparesis, hemianopia, and right facial droop. Primary stroke risk factors: hypertension. She was found to be a candidate for tPa that she received on 5/15. She was also a candidate for IA and brought in for  successful mechanical thrombectomy with good restoration of blood flow after one pass, TICI 3. Patient monitored in the ICU initially and required intubation due to frequent vomiting and concern for airway protection. She was successfully extubated on 5/16 and transferred to the floor without issues. Risk stratification obtained while inpatient as follows: LDL: 126, A1C: 5.8, TTE reveals EF >55% without evidence of intracardiac thrombus. 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 [facility] showed interval left basal ganglia hyperdensity, so repeat CTH ordered on 5/16 showing resolution of finding which likely represented contrast staining from IA procedure       Treatment Provided   Treatment provided  Cognitive-Linquistic      Pain Assessment   Pain Assessment  No/denies pain      Cognitive-Linquistic Treatment   Treatment focused on  Aphasia    Skilled Treatment  Given written descriptors, patient named target objects with 90% accuracy given min verbal cues.  Patient named synonyms with 100% accuracy given min verbal cues and 55% accuracy independently.  Patient wrote an appropriate grammatically correct sentence given a target word with 95% accuracy given min verbal cues.  Patient identified category given a list of 3 items and named 3 additional members with 100% accuracy given mod verbal  cues and with 50% accuracy independently.     Assessment / Recommendations / Plan   Plan  Continue with current plan of care      Progression Toward Goals   Progression toward goals  Progressing toward goals       SLP Education - 06/22/18 1657    Education Details  compensatory word finding strategies    Person(s) Educated  Patient    Methods  Explanation;Demonstration;Verbal cues    Comprehension  Verbalized understanding;Need further instruction;Returned demonstration;Verbal cues required         SLP Long Term  Goals - 06/13/18 0747      SLP LONG TERM GOAL #1   Title  Patient will describe pictured objects by naming 3 semantic features with 80% accuracy given min verbal cues.    Time  8    Period  Weeks    Status  New      SLP LONG TERM GOAL #2   Title  Patient will name 10 members in a given category with 80% accuracy given min verbal cues.    Time  8    Period  Weeks    Status  New      SLP LONG TERM GOAL #3   Title  Patient will compose a novel sentence when given 1-2 target words with 80% accuracy independently.    Time  8    Period  Weeks    Status  New      SLP LONG TERM GOAL #4   Title  Patient will give 3 semantic descriptors when having word finding difficulty in conversation with 100% accuracy given min verbal cues.    Time  8    Period  Weeks    Status  New      SLP LONG TERM GOAL #5   Title  Patient will be independent with resonant voice exercises.    Time  8    Period  Weeks    Status  New       Plan - 06/22/18 1658    Clinical Impression Statement  Patient demonstrated improved accuracy with category naming given mod verbal cues and naming given written description given min verbal cues. Noted mild improvements with independent accuracy and decreased written paraphasisas writing words.   Speech Therapy Frequency  2x / week    Duration  Other (comment)   8 weeks   Treatment/Interventions  Compensatory strategies;SLP instruction and feedback;Patient/family education;Functional tasks    Potential to Achieve Goals  Good    Potential Considerations  Ability to learn/carryover information;Family/community support;Cooperation/participation level    SLP Home Exercise Plan  Word finding and resonant voice exercises    Consulted and Agree with Plan of Care  Patient    Family Member Consulted  Daughter       Patient will benefit from skilled therapeutic intervention in order to improve the following deficits and impairments:   Aphasia    Problem List There are no  active problems to display for this patient.   HatfieldNovak,Meagen Limones, MA, CCC-SLP 06/22/2018, 4:59 PM  [place] [facility] [address] [place], [place], [address] Phone: [phone]   Fax:  [phone]   Name: Adriana Hughes MRN: [id number] Date of Birth: 1941-04-17

## 2018-06-24 ENCOUNTER — Ambulatory Visit: Payer: Medicare Other | Admitting: Physical Therapy

## 2018-06-25 ENCOUNTER — Ambulatory Visit: Payer: Medicare Other | Admitting: Speech Pathology

## 2018-06-25 ENCOUNTER — Ambulatory Visit: Payer: Medicare Other

## 2018-06-25 VITALS — BP 152/76 | HR 77

## 2018-06-25 DIAGNOSIS — R4701 Aphasia: Secondary | ICD-10-CM | POA: Diagnosis not present

## 2018-06-25 DIAGNOSIS — R262 Difficulty in walking, not elsewhere classified: Secondary | ICD-10-CM

## 2018-06-25 DIAGNOSIS — M6281 Muscle weakness (generalized): Secondary | ICD-10-CM

## 2018-06-25 NOTE — Therapy (Signed)
[facility] MAIN [facility] SERVICES [address] [place], [place], [address] Phone: [phone]   Fax:  [phone]  Physical Therapy Treatment  Patient Details  Name: Adriana Hughes MRN: [id number] Date of Birth: [birth year] Referring Provider: Clearence Cheek    Encounter Date: 06/25/2018  PT End of Session - 06/25/18 1657    Visit Number  4    Number of Visits  25    Date for PT Re-Evaluation  09/07/18    PT Start Time  1550    PT Stop Time  1632    PT Time Calculation (min)  42 min    Equipment Utilized During Treatment  Gait belt    Activity Tolerance  Patient tolerated treatment well;Patient limited by fatigue    Behavior During Therapy  [facility] for tasks assessed/performed       Past Medical History:  Diagnosis Date  . Arthritis   . Fibrocystic breast disease   . GERD (gastroesophageal reflux disease)   . Hyperlipidemia   . Hypertension     Past Surgical History:  Procedure Laterality Date  . APPENDECTOMY    . BREAST BIOPSY    . BREAST EXCISIONAL BIOPSY Bilateral [id number]   benign  . EYE SURGERY    . TEE WITHOUT CARDIOVERSION N/A 04/21/2018   Procedure: TRANSESOPHAGEAL ECHOCARDIOGRAM (TEE);  Surgeon: Lamar Blinks, MD;  Location: [facility] ORS;  Service: Cardiovascular;  Laterality: N/A;    Vitals:   06/25/18 1652  BP: (!) 152/76  Pulse: 77  SpO2: 99%    Subjective Assessment - 06/25/18 1652    Subjective  Pt reports that she is doing well today. She denies pain currently and has no specific questions or concerns. She denies recent falls or recent changes in her health.     Patient is accompained by:  Family member    Pertinent History  Per discharge summary from [facility]: Ischemic Stroke s/p alteplase and IA:  Presented initially with symptoms on arrival notable for left gaze deviation, right sided hemiparesis, hemianopia, and right facial droop. Primary stroke risk factors: hypertension. She was found to be a candidate for tPa  that she received on 5/15. She was also a candidate for IA and brought in for successful mechanical thrombectomy with good restoration of blood flow after one pass, TICI 3. Patient monitored in the ICU initially and required intubation due to frequent vomiting and concern for airway protection. She was successfully extubated on 5/16 and transferred to the floor without issues. Risk stratification obtained while inpatient as follows: LDL: 126, A1C: 5.8, TTE reveals EF >55% without evidence of intracardiac thrombus. 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 Post tPA [facility] 5/15 showed interval left basal ganglia hyperdensity, so repeat CTH ordered on 5/16 showing resolution of finding which likely represented contrast staining from IA procedure     How long can you stand comfortably?  no limits    Patient Stated Goals  Patient wants to be able to walk longer distances and not walk towards the right side.     Currently in Pain?  No/denies           TREATMENT   Neuromuscular Re-education 1/2 foam flat side up, A/P orientation, static balance 30s x 2; Tandem standing on 1/2 foam, flat side up, alternating forward LE 30s x 2 on each leg; 1/2 foam flat side up, heel/toe rocking 30s x 2; Stepping over hurdle fwd /  bwd with no UE support Airex balance beam tandem gait 6 lengths; Airex balance beam side stepping 6 lengths;  Ther-ex  NuStep L2 x 4 minutes for warm-up during history (2 minutes unbilled); Quantum leg press 90# x 20, 105# x 20, 110# x 12, attempted 120# but pt unable to perform, appropriate fatigue  Lunges to BOSU, round side up, no UE support alternating leading LE x 10 each; Step-ups to BOSU, round side up, no UE support alternating leading LE x 10 each; Sit to stand without UE support x 10;  Patient needs occasional verbal cueing to improve posture and cueing to correctly perform exercises slowly, holding at end of  range to increase motor firing of desired muscle to encourage fatigue                      PT Education - 06/25/18 1657    Education Details  exercise form/technique    Person(s) Educated  Patient    Methods  Explanation    Comprehension  Verbalized understanding       PT Short Term Goals - 06/16/18 0808      PT SHORT TERM GOAL #1   Title  Patient will be independent in home exercise program to improve strength/mobility for better functional independence with ADLs.    Time  6    Period  Weeks    Status  New    Target Date  07/28/18      PT SHORT TERM GOAL #2   Title  Patient will increase six minute walk test distance to >1000 for progression to community ambulator and improve gait ability    Baseline  585 ft    Time  6    Period  Weeks    Status  New    Target Date  07/28/18        PT Long Term Goals - 06/16/18 0810      PT LONG TERM GOAL #1   Title  Patient will increase 10 meter walk test to >1.12m/s as to improve gait speed for better community ambulation and to reduce fall risk.    Time  12    Period  Weeks    Status  New    Target Date  09/08/18      PT LONG TERM GOAL #2   Title  Patient will ascend/descend 4 stairs without rail assist independently without loss of balance to improve ability to get in/out of home.     Baseline  needs rail    Time  12    Period  Weeks    Status  New    Target Date  09/08/18      PT LONG TERM GOAL #3   Title   Patient will demonstrate an improved Berg Balance Score of > as to demonstrate improved balance with ADLs such as sitting/standing and transfer balance and reduced fall risk    Time  12    Period  Weeks    Status  New    Target Date  09/08/18      PT LONG TERM GOAL #4   Title  Patient will increase BLE gross strength to 4+/5 as to improve functional strength for independent gait, increased standing tolerance and increased ADL ability.    Time  12    Period  Weeks    Status  New    Target Date   09/08/18            Plan -  06/25/18 1657    Clinical Impression Statement  Patient demonstrates deficits in balance especially on uneven surfaces. LLE is more unsteady than RLE when stepping up to BOSU ball. She is able to demonstrate good technique with leg press and gradually progresses resistance with each set. Appropriate fatigue demonstrated. Excellent motivation throughout session. Patient will continue to benefit from skilled physical therapy to improve endurance and dynamic balance to reduce fall risk    Rehab Potential  Good    PT Frequency  2x / week    PT Duration  12 weeks    PT Treatment/Interventions  Manual techniques;Patient/family education;Balance training;Functional mobility training;Therapeutic activities;Therapeutic exercise;Gait training;Aquatic Therapy       Patient will benefit from skilled therapeutic intervention in order to improve the following deficits and impairments:  Abnormal gait, Decreased balance, Decreased endurance, Decreased mobility, Difficulty walking, Decreased activity tolerance, Decreased safety awareness, Decreased strength  Visit Diagnosis: Difficulty in walking, not elsewhere classified  Muscle weakness (generalized)     Problem List There are no active problems to display for this patient.  Lynnea MaizesJason D Makale Pindell PT, DPT, GCS  Weston Kallman 06/26/2018, 12:02 PM  [facility] [address] [place], [place], [address] Phone: [phone]   Fax:  [phone]  Name: Adriana Hughes MRN: [id number] Date of Birth: 28-Nov-1940

## 2018-06-26 ENCOUNTER — Encounter: Payer: Self-pay | Admitting: Speech Pathology

## 2018-06-26 NOTE — Therapy (Signed)
[place] [facility] [address] [place], [place], [address] Phone: [phone]   Fax:  [phone]  Speech Language Pathology Treatment  Patient Details  Name: Adriana GrapesSue R Mccole MRN: [id number] Date of Birth: 1941/07/14 Referring Provider: Clearence CheekSusan Wilson, NP   Encounter Date: 06/25/2018  End of Session - 06/26/18 1409    Visit Number  4    Number of Visits  17    Date for SLP Re-Evaluation  07/30/18    SLP Start Time  1400    SLP Stop Time   1445    SLP Time Calculation (min)  45 min    Activity Tolerance  Patient tolerated treatment well       Past Medical History:  Diagnosis Date  . Arthritis   . Fibrocystic breast disease   . GERD (gastroesophageal reflux disease)   . Hyperlipidemia   . Hypertension     Past Surgical History:  Procedure Laterality Date  . APPENDECTOMY    . BREAST BIOPSY    . BREAST EXCISIONAL BIOPSY Bilateral [id number]   benign  . EYE SURGERY    . TEE WITHOUT CARDIOVERSION N/A 04/21/2018   Procedure: TRANSESOPHAGEAL ECHOCARDIOGRAM (TEE);  Surgeon: Lamar BlinksKowalski, Bruce J, MD;  Location: [facility] ORS;  Service: Cardiovascular;  Laterality: N/A;    There were no vitals filed for this visit.  Subjective Assessment - 06/26/18 1403    Subjective  Patient reported she is doing well today.    Currently in Pain?  No/denies            ADULT SLP TREATMENT - 06/26/18 0001      General Information   Behavior/Cognition  Alert;Cooperative;Pleasant mood    Patient Positioning  Upright in chair    HPI  [facility] Stroke s/p alteplase and IA:  Presented initially with symptoms on arrival notable for left gaze deviation, right sided hemiparesis, hemianopia, and right facial droop. Primary stroke risk factors: hypertension. She was found to be a candidate for tPa that she received on 5/15. She was also a candidate for IA and brought in for successful mechanical thrombectomy with good  restoration of blood flow after one pass, TICI 3. Patient monitored in the ICU initially and required intubation due to frequent vomiting and concern for airway protection. She was successfully extubated on 5/16 and transferred to the floor without issues. Risk stratification obtained while inpatient as follows: LDL: 126, A1C: 5.8, TTE reveals EF >55% without evidence of intracardiac thrombus. 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 [facility] showed interval left basal ganglia hyperdensity, so repeat CTH ordered on 5/16 showing resolution of finding which likely represented contrast staining from IA procedure       Treatment Provided   Treatment provided  Cognitive-Linquistic      Pain Assessment   Pain Assessment  No/denies pain      Cognitive-Linquistic Treatment   Treatment focused on  Aphasia    Skilled Treatment  Patient named 5 semantic features given pictured objects with 100% accuracy given min to moderate verbal cues.  Given written descriptors, patient named target objects and actions with 100% accuracy independently.  Patient identified category given a target word and named 5 additional members with 100% accuracy given mod verbal cues.     Assessment / Recommendations / Plan   Plan  Continue with current plan of care  Progression Toward Goals   Progression toward goals  Progressing toward goals       SLP Education - 06/26/18 1409    Education Details  Compensatory strategies for word finding to aid expressive communication    Person(s) Educated  Patient    Methods  Explanation;Demonstration    Comprehension  Verbalized understanding;Need further instruction;Returned demonstration;Verbal cues required         SLP Long Term Goals - 06/13/18 0747      SLP LONG TERM GOAL #1   Title  Patient will describe pictured objects by naming 3 semantic features with 80% accuracy given min verbal  cues.    Time  8    Period  Weeks    Status  New      SLP LONG TERM GOAL #2   Title  Patient will name 10 members in a given category with 80% accuracy given min verbal cues.    Time  8    Period  Weeks    Status  New      SLP LONG TERM GOAL #3   Title  Patient will compose a novel sentence when given 1-2 target words with 80% accuracy independently.    Time  8    Period  Weeks    Status  New      SLP LONG TERM GOAL #4   Title  Patient will give 3 semantic descriptors when having word finding difficulty in conversation with 100% accuracy given min verbal cues.    Time  8    Period  Weeks    Status  New      SLP LONG TERM GOAL #5   Title  Patient will be independent with resonant voice exercises.    Time  8    Period  Weeks    Status  New       Plan - 06/26/18 1410    Clinical Impression Statement  Patient demonstrated improved independence with naming objects and actions given verbal descriptions and improved word retrieval providing an increased number of semantic features to describe pictured objects.   Speech Therapy Frequency  2x / week    Duration  Other (comment)   8 weeks   Treatment/Interventions  Compensatory strategies;SLP instruction and feedback;Patient/family education;Functional tasks    Potential to Achieve Goals  Good    Potential Considerations  Ability to learn/carryover information;Family/community support;Cooperation/participation level    SLP Home Exercise Plan  Word finding and resonant voice exercises    Consulted and Agree with Plan of Care  Patient       Patient will benefit from skilled therapeutic intervention in order to improve the following deficits and impairments:   Aphasia    Problem List There are no active problems to display for this patient.   [place], MA, CCC-SLP 06/26/2018, 2:14 PM  [place] [facility] MAIN [facility] SERVICES [address] [place], [place], [address] Phone: [phone]    Fax:  [phone]   Name: ZULEMA PULASKI MRN: [id number] Date of Birth: 1940-12-27

## 2018-06-29 ENCOUNTER — Encounter: Payer: Self-pay | Admitting: Speech Pathology

## 2018-06-29 ENCOUNTER — Ambulatory Visit: Payer: Medicare Other | Admitting: Speech Pathology

## 2018-06-29 ENCOUNTER — Ambulatory Visit: Payer: Medicare Other

## 2018-06-29 VITALS — BP 142/84 | HR 72

## 2018-06-29 DIAGNOSIS — R262 Difficulty in walking, not elsewhere classified: Secondary | ICD-10-CM

## 2018-06-29 DIAGNOSIS — M6281 Muscle weakness (generalized): Secondary | ICD-10-CM

## 2018-06-29 DIAGNOSIS — R4701 Aphasia: Secondary | ICD-10-CM

## 2018-06-29 NOTE — Therapy (Signed)
[facility] [address] [place], [place], [address] Phone: [phone]   Fax:  [phone]  Speech Language Pathology Treatment  Patient Details  Name: Adriana Hughes MRN: [id number] Date of Birth: [birth year] Referring Provider: Clearence CheekSusan Wilson, NP   Encounter Date: 06/29/2018  End of Session - 06/29/18 1702    Visit Number  5    Number of Visits  17    Date for SLP Re-Evaluation  07/30/18    SLP Start Time  1402    SLP Stop Time   1450    SLP Time Calculation (min)  48 min    Activity Tolerance  Patient tolerated treatment well       Past Medical History:  Diagnosis Date  . Arthritis   . Fibrocystic breast disease   . GERD (gastroesophageal reflux disease)   . Hyperlipidemia   . Hypertension     Past Surgical History:  Procedure Laterality Date  . APPENDECTOMY    . BREAST BIOPSY    . BREAST EXCISIONAL BIOPSY Bilateral [id number]   benign  . EYE SURGERY    . TEE WITHOUT CARDIOVERSION N/A 04/21/2018   Procedure: TRANSESOPHAGEAL ECHOCARDIOGRAM (TEE);  Surgeon: Lamar BlinksKowalski, Bruce J, MD;  Location: [facility] ORS;  Service: Cardiovascular;  Laterality: N/A;    There were no vitals filed for this visit.  Subjective Assessment - 06/29/18 1700    Subjective  Patient reported she did all of her homework.    Currently in Pain?  No/denies            ADULT SLP TREATMENT - 06/29/18 0001      General Information   Behavior/Cognition  Alert;Cooperative;Pleasant mood    Patient Positioning  Upright in chair    HPI  [facility]schemic Stroke s/p alteplase and IA:  Presented initially with symptoms on arrival notable for left gaze deviation, right sided hemiparesis, hemianopia, and right facial droop. Primary stroke risk factors: hypertension. She was found to be a candidate for tPa that she received on 5/15. She was also a candidate for IA and brought in for successful mechanical thrombectomy with good  restoration of blood flow after one pass, TICI 3. Patient monitored in the ICU initially and required intubation due to frequent vomiting and concern for airway protection. She was successfully extubated on 5/16 and transferred to the floor without issues.. 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  Given written descriptors, patient named target objects and actions with 100% accuracy given min to moderate verbal cues.  Patient wrote target words given partial spelling and description of target with 90% accuracy given min verbal cues for correction of written phonemic paraphasias.     Assessment / Recommendations / Plan   Plan  Continue with current plan of care      Progression Toward Goals   Progression toward goals  Progressing toward goals       SLP Education - 06/29/18 1701    Education Details  Compensatory strategies for word finding to aid verbal and written communication    Person(s) Educated  Patient  Methods  Explanation;Demonstration;Handout    Comprehension  Verbalized understanding;Verbal cues required;Returned demonstration;Need further instruction         SLP Long Term Goals - 06/13/18 0747      SLP LONG TERM GOAL #1   Title  Patient will describe pictured objects by naming 3 semantic features with 80% accuracy given min verbal cues.    Time  8    Period  Weeks    Status  New      SLP LONG TERM GOAL #2   Title  Patient will name 10 members in a given category with 80% accuracy given min verbal cues.    Time  8    Period  Weeks    Status  New      SLP LONG TERM GOAL #3   Title  Patient will compose a novel sentence when given  1-2 target words with 80% accuracy independently.    Time  8    Period  Weeks    Status  New      SLP LONG TERM GOAL #4   Title  Patient will give 3 semantic descriptors when having word finding difficulty in conversation with 100% accuracy given min verbal cues.    Time  8    Period  Weeks    Status  New      SLP LONG TERM GOAL #5   Title  Patient will be independent with resonant voice exercises.    Time  8    Period  Weeks    Status  New       Plan - 06/29/18 1702    Clinical Impression Statement  Patient requires min to moderate verbal cues for word finding to complete treatment tasks with accuracy. Noted emerging independence using semantic feature analysis to aid word finding throughout treatment.   Speech Therapy Frequency  2x / week    Duration  Other (comment)   8 weeks   Treatment/Interventions  Compensatory strategies;SLP instruction and feedback;Patient/family education;Functional tasks    Potential to Achieve Goals  Good    Potential Considerations  Ability to learn/carryover information;Family/community support;Cooperation/participation level    SLP Home Exercise Plan  Word finding exercises    Consulted and Agree with Plan of Care  Patient       Patient will benefit from skilled therapeutic intervention in order to improve the following deficits and impairments:   Aphasia    Problem List There are no active problems to display for this patient.   [place], MA, CCC-SLP 06/29/2018, 5:03 PM  [place] [facility] MAIN [facility] SERVICES [address] [place], [place], [address] Phone: [phone]   Fax:  [phone]   Name: Adriana Hughes MRN: [id number] Date of Birth: [birth year]

## 2018-06-29 NOTE — Therapy (Signed)
[place] [facility] MAIN [facility] SERVICES [address] [place], [place], [address] Phone: [phone]   Fax:  [phone]  Physical Therapy Treatment  Patient Details  Name: Adriana Hughes MRN: [id number] Date of Birth: 05-30-1941 Referring Provider: Clearence Cheek    Encounter Date: 06/29/2018  PT End of Session - 06/29/18 1516    Visit Number  5    Number of Visits  25    Date for PT Re-Evaluation  09/07/18    PT Start Time  1515    PT Stop Time  1600    PT Time Calculation (min)  45 min    Equipment Utilized During Treatment  Gait belt    Activity Tolerance  Patient tolerated treatment well;Patient limited by fatigue    Behavior During Therapy  [facility] for tasks assessed/performed       Past Medical History:  Diagnosis Date  . Arthritis   . Fibrocystic breast disease   . GERD (gastroesophageal reflux disease)   . Hyperlipidemia   . Hypertension     Past Surgical History:  Procedure Laterality Date  . APPENDECTOMY    . BREAST BIOPSY    . BREAST EXCISIONAL BIOPSY Bilateral 1324,4010   benign  . EYE SURGERY    . TEE WITHOUT CARDIOVERSION N/A 04/21/2018   Procedure: TRANSESOPHAGEAL ECHOCARDIOGRAM (TEE);  Surgeon: Lamar Blinks, MD;  Location: [facility] ORS;  Service: Cardiovascular;  Laterality: N/A;    Vitals:   06/29/18 1518  BP: (!) 142/84  Pulse: 72  SpO2: 99%    Subjective Assessment - 06/29/18 1520    Subjective  Pt reports that she is doing well today. She denies pain currently and has no specific questions or concerns. She denies recent falls or recent changes in her health. HEP is going well.     Patient is accompained by:  Family member    Pertinent History  Per discharge summary from [facility]: Ischemic Stroke s/p alteplase and IA:  Presented initially with symptoms on arrival notable for left gaze deviation, right sided hemiparesis, hemianopia, and right facial droop. Primary stroke risk factors: hypertension. She was found to be a  candidate for tPa that she received on 5/15. She was also a candidate for IA and brought in for successful mechanical thrombectomy with good restoration of blood flow after one pass, TICI 3. Patient monitored in the ICU initially and required intubation due to frequent vomiting and concern for airway protection. She was successfully extubated on 5/16 and transferred to the floor without issues. Risk stratification obtained while inpatient as follows: LDL: 126, A1C: 5.8, TTE reveals EF >55% without evidence of intracardiac thrombus. 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 Post tPA [facility] 5/15 showed interval left basal ganglia hyperdensity, so repeat CTH ordered on 5/16 showing resolution of finding which likely represented contrast staining from IA procedure     How long can you stand comfortably?  no limits    Patient Stated Goals  Patient wants to be able to walk longer distances and not walk towards the right side.     Currently in Pain?  No/denies          TREATMENT   Neuromuscular Re-education Airex balance with stepping stone taps in 1, 2, and 3 stone sequencing as called out by therapist, alternating LE; Airex balance with feet together with SAEBO dynamic reaching alternating UE and varying forward reach distance; 1/2 foam flat side  up, A/P orientation, static balance 30s x 2; Tandem standing on 1/2 foam, flat side up, alternating forward LE 30s x 2 on each leg; 1/2 foam flat side up, heel/toe rocking 30s x 2;  Ther-ex  NuStep L2 increasing gradually to level 4 x 4 minutes for warm-up during history (2 minutes unbilled); Quantum leg press 120# x 20, 125# x 10, pt taken to failure at 10 repetitions during second set;  Stair ascend/descend with step-over-step pattern and no UE support x 4 each direction, intermittent minA+1 from therapist to stabilize; Lateral step-ups to 6" step x 10 each direction; Sit to stand  without UE support with Airex under feet x 10;  Patient needs occasional verbal cueing to improve posture and cueing to correctly perform exercises slowly, holding at end of range to increase motor firing of desired muscle to encourage fatigue. Intermittent seated rest breaks required secondary to fatigue between exercises;                 PT Education - 06/29/18 1516    Education Details  exercise form/technique    Person(s) Educated  Patient    Methods  Explanation    Comprehension  Verbalized understanding       PT Short Term Goals - 06/16/18 0808      PT SHORT TERM GOAL #1   Title  Patient will be independent in home exercise program to improve strength/mobility for better functional independence with ADLs.    Time  6    Period  Weeks    Status  New    Target Date  07/28/18      PT SHORT TERM GOAL #2   Title  Patient will increase six minute walk test distance to >1000 for progression to community ambulator and improve gait ability    Baseline  585 ft    Time  6    Period  Weeks    Status  New    Target Date  07/28/18        PT Long Term Goals - 06/16/18 0810      PT LONG TERM GOAL #1   Title  Patient will increase 10 meter walk test to >1.6980m/s as to improve gait speed for better community ambulation and to reduce fall risk.    Time  12    Period  Weeks    Status  New    Target Date  09/08/18      PT LONG TERM GOAL #2   Title  Patient will ascend/descend 4 stairs without rail assist independently without loss of balance to improve ability to get in/out of home.     Baseline  needs rail    Time  12    Period  Weeks    Status  New    Target Date  09/08/18      PT LONG TERM GOAL #3   Title   Patient will demonstrate an improved Berg Balance Score of > as to demonstrate improved balance with ADLs such as sitting/standing and transfer balance and reduced fall risk    Time  12    Period  Weeks    Status  New    Target Date  09/08/18      PT LONG  TERM GOAL #4   Title  Patient will increase BLE gross strength to 4+/5 as to improve functional strength for independent gait, increased standing tolerance and increased ADL ability.    Time  12    Period  Weeks    Status  New    Target Date  09/08/18            Plan - 06/29/18 1516    Clinical Impression Statement  Patient demonstrates improving balance on Airex and half foam roller. She does notably worse with L foot in the rear when standing in tandem. She is able to progress her leg press resistance today. In the future consider single leg press to isolate each LE for strengthening. Appropriate fatigue demonstrated today during exercise. Excellent motivation throughout session but rest breaks required due to fatigue. Patient will continue to benefit from skilled physical therapy to improve endurance and dynamic balance to reduce fall risk.    Rehab Potential  Good    PT Frequency  2x / week    PT Duration  12 weeks    PT Treatment/Interventions  Manual techniques;Patient/family education;Balance training;Functional mobility training;Therapeutic activities;Therapeutic exercise;Gait training;Aquatic Therapy       Patient will benefit from skilled therapeutic intervention in order to improve the following deficits and impairments:  Abnormal gait, Decreased balance, Decreased endurance, Decreased mobility, Difficulty walking, Decreased activity tolerance, Decreased safety awareness, Decreased strength  Visit Diagnosis: Muscle weakness (generalized)  Difficulty in walking, not elsewhere classified     Problem List There are no active problems to display for this patient.  Lynnea Maizes PT, DPT, GCS  [place],Jason 06/29/2018, 4:41 PM  [place] [facility] MAIN [facility] SERVICES [address] [place], [place], [address] Phone: [phone]   Fax:  [phone]  Name: Adriana Hughes MRN: [id number] Date of Birth: 1941/03/23

## 2018-07-02 ENCOUNTER — Ambulatory Visit: Payer: Medicare Other | Admitting: Speech Pathology

## 2018-07-02 ENCOUNTER — Encounter: Payer: Self-pay | Admitting: Physical Therapy

## 2018-07-02 ENCOUNTER — Encounter: Payer: Self-pay | Admitting: Speech Pathology

## 2018-07-02 ENCOUNTER — Ambulatory Visit: Payer: Medicare Other | Admitting: Physical Therapy

## 2018-07-02 DIAGNOSIS — R4701 Aphasia: Secondary | ICD-10-CM

## 2018-07-02 DIAGNOSIS — M6281 Muscle weakness (generalized): Secondary | ICD-10-CM

## 2018-07-02 DIAGNOSIS — R262 Difficulty in walking, not elsewhere classified: Secondary | ICD-10-CM

## 2018-07-02 NOTE — Therapy (Addendum)
[facility] [address] [place], [place], [address] Phone: [phone]   Fax:  [phone]  Speech Language Pathology Treatment  Patient Details  Name: Adriana GrapesSue R Soh MRN: [id number] Date of Birth: 07-12-1941 Referring Provider: Clearence CheekSusan Wilson, NP   Encounter Date: 07/02/2018  End of Session - 07/02/18 1749    Visit Number  6    Number of Visits  17    Date for SLP Re-Evaluation  07/30/18    SLP Start Time  1500    SLP Stop Time   1546    SLP Time Calculation (min)  46 min    Activity Tolerance  Patient tolerated treatment well       Past Medical History:  Diagnosis Date  . Arthritis   . Fibrocystic breast disease   . GERD (gastroesophageal reflux disease)   . Hyperlipidemia   . Hypertension     Past Surgical History:  Procedure Laterality Date  . APPENDECTOMY    . BREAST BIOPSY    . BREAST EXCISIONAL BIOPSY Bilateral [id number]   benign  . EYE SURGERY    . TEE WITHOUT CARDIOVERSION N/A 04/21/2018   Procedure: TRANSESOPHAGEAL ECHOCARDIOGRAM (TEE);  Surgeon: Lamar BlinksKowalski, Bruce J, MD;  Location: [facility] ORS;  Service: Cardiovascular;  Laterality: N/A;    There were no vitals filed for this visit.  Subjective Assessment - 07/02/18 1748    Subjective  Patient reported she is doing well today, smiling often.    Currently in Pain?  No/denies            ADULT SLP TREATMENT - 07/02/18 0001      General Information   Behavior/Cognition  Alert;Cooperative;Pleasant mood    Patient Positioning  Upright in chair    HPI  [facility]schemic Stroke s/p alteplase and IA:  Presented initially with symptoms on arrival notable for left gaze deviation, right sided hemiparesis, hemianopia, and right facial droop. Primary stroke risk factors: hypertension. She was found to be a candidate for tPa that she received on 5/15. She was also a candidate for IA and brought in for successful mechanical thrombectomy  with good restoration of blood flow after one pass, TICI 3. Patient monitored in the ICU initially and required intubation due to frequent vomiting and concern for airway protection. She was successfully extubated on 5/16 and transferred to the floor without issues. Risk stratification obtained while inpatient as follows: LDL: 126, A1C: 5.8, TTE reveals EF >55% without evidence of intracardiac thrombus. 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       Treatment Provided   Treatment provided  Cognitive-Linquistic      Pain Assessment   Pain Assessment  No/denies pain      Cognitive-Linquistic Treatment   Treatment focused on  Aphasia    Skilled Treatment Patient named 4-5 semantic features given pictured objects with 100% accuracy given intermittent min verbal cues.  Patient named 12 members in a given category with 100% accuracy given min verbal cues.  Given written descriptors, patient wrote names of target objects and actions with 85% accuracy given min to moderate verbal cues (self correct noted x3).  Patient wrote answers to complete analogies with an appropriate word given min verbal cues with 100% accuracy and with 78% accuracy independently.     Assessment / Recommendations / Plan   Plan  Continue with current plan of care  Progression Toward Goals   Progression toward goals  Progressing toward goals       SLP Education - 07/02/18 1748    Education Details  Compensatory strategies for word finding to aid verbal and written communication    Person(s) Educated  Patient    Methods  Explanation;Demonstration;Handout    Comprehension  Verbalized understanding;Need further instruction;Returned demonstration;Verbal cues required         SLP Long Term Goals - 06/13/18 0747      SLP LONG TERM GOAL #1   Title  Patient will describe pictured objects by naming 3 semantic features with 80% accuracy given min  verbal cues.    Time  8    Period  Weeks    Status  New      SLP LONG TERM GOAL #2   Title  Patient will name 10 members in a given category with 80% accuracy given min verbal cues.    Time  8    Period  Weeks    Status  New      SLP LONG TERM GOAL #3   Title  Patient will compose a novel sentence when given 1-2 target words with 80% accuracy independently.    Time  8    Period  Weeks    Status  New      SLP LONG TERM GOAL #4   Title  Patient will give 3 semantic descriptors when having word finding difficulty in conversation with 100% accuracy given min verbal cues.    Time  8    Period  Weeks    Status  New      SLP LONG TERM GOAL #5   Title  Patient will be independent with resonant voice exercises.    Time  8    Period  Weeks    Status  New       Plan - 07/02/18 1749    Clinical Impression Statement  Patient demonstrated improved independence generating semantic features to describe pictures to aid word finding strategies as well as improved category naming given min verbal cues, naming 12 items in a given category. Deferred LTG for dysphonia. Vocal quality markedly improved from initial evaluation. Mild gravely vocal quality remains. Patient reports her voice is near baseline and does not voice any concerns. Treatment to focus on improvement of verbal and written expressive communication.    Speech Therapy Frequency  2x / week    Duration  Other (comment)   8 weeks   Treatment/Interventions  Compensatory strategies;SLP instruction and feedback;Patient/family education;Functional tasks    Potential to Achieve Goals  Good    Potential Considerations  Ability to learn/carryover information;Family/community support;Cooperation/participation level    SLP Home Exercise Plan  Word finding exercises    Consulted and Agree with Plan of Care  Patient       Patient will benefit from skilled therapeutic intervention in order to improve the following deficits and impairments:    Aphasia    Problem List There are no active problems to display for this patient.   [place], MA, CCC-SLP 07/02/2018, 5:58 PM  [facility] MAIN [facility] SERVICES [address] [place], [place], [address] Phone: [phone]   Fax:  [phone]   Name: MORIYA MITCHELL MRN: [id number] Date of Birth: 06-05-1941

## 2018-07-02 NOTE — Therapy (Signed)
[facility] MAIN [facility] SERVICES [address] [place], [place], [address] Phone: [phone]   Fax:  [phone]  Physical Therapy Treatment  Patient Details  Name: Adriana Hughes MRN: [id number] Date of Birth: February 03, 1941 Referring Provider: Clearence Cheek    Encounter Date: 07/02/2018  PT End of Session - 07/02/18 1147    Visit Number  6    Number of Visits  25    Date for PT Re-Evaluation  09/07/18    PT Start Time  1145    PT Stop Time  1230    PT Time Calculation (min)  45 min    Equipment Utilized During Treatment  Gait belt    Activity Tolerance  Patient tolerated treatment well;Patient limited by fatigue    Behavior During Therapy  [facility] for tasks assessed/performed       Past Medical History:  Diagnosis Date  . Arthritis   . Fibrocystic breast disease   . GERD (gastroesophageal reflux disease)   . Hyperlipidemia   . Hypertension     Past Surgical History:  Procedure Laterality Date  . APPENDECTOMY    . BREAST BIOPSY    . BREAST EXCISIONAL BIOPSY Bilateral [id number]   benign  . EYE SURGERY    . TEE WITHOUT CARDIOVERSION N/A 04/21/2018   Procedure: TRANSESOPHAGEAL ECHOCARDIOGRAM (TEE);  Surgeon: Lamar Blinks, MD;  Location: [facility] ORS;  Service: Cardiovascular;  Laterality: N/A;    There were no vitals filed for this visit.  Subjective Assessment - 07/02/18 1146    Subjective  Pt reports that she is doing well today. She denies pain currently and has no specific questions or concerns. She denies recent falls or recent changes in her health. HEP is going well.     Patient is accompained by:  Family member    Pertinent History  Per discharge summary from [facility]: Ischemic Stroke s/p alteplase and IA:  Presented initially with symptoms on arrival notable for left gaze deviation, right sided hemiparesis, hemianopia, and right facial droop. Primary stroke risk factors: hypertension. She was found to be a candidate for tPa that she  received on 5/15. She was also a candidate for IA and brought in for successful mechanical thrombectomy with good restoration of blood flow after one pass, TICI 3. Patient monitored in the ICU initially and required intubation due to frequent vomiting and concern for airway protection. She was successfully extubated on 5/16 and transferred to the floor without issues. Risk stratification obtained while inpatient as follows: LDL: 126, A1C: 5.8, TTE reveals EF >55% without evidence of intracardiac thrombus. 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 Post tPA [facility] 5/15 showed interval left basal ganglia hyperdensity, so repeat CTH ordered on 5/16 showing resolution of finding which likely represented contrast staining from IA procedure     How long can you stand comfortably?  no limits    Patient Stated Goals  Patient wants to be able to walk longer distances and not walk towards the right side.     Currently in Pain?  No/denies    Multiple Pain Sites  No        TREATMENT: Warm up on octane fitness level 4 x5  min (Unbilled):  Instructed patient in advanced balance exercise: Standing modified tandem on airex:Trunk rotation side to side with ball BUE hold and head turning with trunk x 10  1/2 foam flat side down, Tapping balloon to  mirror  x 2 mins  feet apart  Alternate toe taps on 4 inch step without rail assist x15 with cues to step backwards for better foot placement and avoid toe catching; Modified tandem stance with BUE ball up/down x10 each foot in front with CGA for safety Standing on 1/2 bolster (flat side up):Feet flat, apart, BUE wand flexion x10 reps with min A For safety; Patient able to keep balance well with minimal posterior loss of balance; Agility ladder fwd stepping, side stepping x 15 feet  Star stepping single leg with single leg stand x 5 reps  stepping over hurdle x 10 fwd / bwd, 10 side to side with UE  support Leg press 100 lbs x 20 x 2    Standing on airex beam:  Tandem stance on airex beam without rail assist 10 sec hold x4 each foot in front with CGA for safety and cues to improve upper trunk control for better balance control; Side stepping down airex beam without rail assist x3 laps each direction with cues to keep feet on beam and avoid stepping off; Standing with feet apart, BUE ball toss x10 unsupported with close supervision; Patient exhibits posterior loss of balance requiring cues for forward weight shift;    CGA and Min to mod verbal cues used throughout with increased in postural sway and LOB most seen with narrow base of support and while on uneven surfaces. Continues to have balance deficits typical with diagnosis. Patient performs intermediate level exercises without pain behaviors and needs verbal cuing for postural alignment and head positioning                       PT Education - 07/02/18 1146    Education Details  saftey, HEP    Person(s) Educated  Patient    Methods  Explanation;Demonstration;Tactile cues    Comprehension  Verbalized understanding;Returned demonstration;Verbal cues required       PT Short Term Goals - 06/16/18 0808      PT SHORT TERM GOAL #1   Title  Patient will be independent in home exercise program to improve strength/mobility for better functional independence with ADLs.    Time  6    Period  Weeks    Status  New    Target Date  07/28/18      PT SHORT TERM GOAL #2   Title  Patient will increase six minute walk test distance to >1000 for progression to community ambulator and improve gait ability    Baseline  585 ft    Time  6    Period  Weeks    Status  New    Target Date  07/28/18        PT Long Term Goals - 06/16/18 0810      PT LONG TERM GOAL #1   Title  Patient will increase 10 meter walk test to >1.23m/s as to improve gait speed for better community ambulation and to reduce fall risk.    Time  12     Period  Weeks    Status  New    Target Date  09/08/18      PT LONG TERM GOAL #2   Title  Patient will ascend/descend 4 stairs without rail assist independently without loss of balance to improve ability to get in/out of home.     Baseline  needs rail    Time  12    Period  Weeks    Status  New  Target Date  09/08/18      PT LONG TERM GOAL #3   Title   Patient will demonstrate an improved Berg Balance Score of > as to demonstrate improved balance with ADLs such as sitting/standing and transfer balance and reduced fall risk    Time  12    Period  Weeks    Status  New    Target Date  09/08/18      PT LONG TERM GOAL #4   Title  Patient will increase BLE gross strength to 4+/5 as to improve functional strength for independent gait, increased standing tolerance and increased ADL ability.    Time  12    Period  Weeks    Status  New    Target Date  09/08/18            Plan - 07/02/18 1147    Clinical Impression Statement  Patient instructed in advanced dynamic and static balance exercise. Utilized stable and uneven surfaces to further challenge dynamic balance. Patient required min Vcs for correct positioning and exercise technique! Patient had a difficult time during narrow base of support challenges. He exhibits better SLS ability being able to progress to foam; Patient would benefit from additional skilled PT Intervention to improve strength, balance and gait safety    Rehab Potential  Good    PT Frequency  2x / week    PT Duration  12 weeks    PT Treatment/Interventions  Manual techniques;Patient/family education;Balance training;Functional mobility training;Therapeutic activities;Therapeutic exercise;Gait training;Aquatic Therapy       Patient will benefit from skilled therapeutic intervention in order to improve the following deficits and impairments:  Abnormal gait, Decreased balance, Decreased endurance, Decreased mobility, Difficulty walking, Decreased activity tolerance,  Decreased safety awareness, Decreased strength  Visit Diagnosis: Muscle weakness (generalized)  Difficulty in walking, not elsewhere classified  Aphasia  Expressive aphasia     Problem List There are no active problems to display for this patient.   [address], Kristine S, [place] DPT 07/02/2018, 11:49 AM  [facility] [address] [place], [place], [address] Phone: [phone]   Fax:  [phone]  Name: Joylene GrapesSue R Crable MRN: [id number] Date of Birth: Mar 18, 1941

## 2018-07-09 ENCOUNTER — Ambulatory Visit: Payer: Medicare Other | Attending: Nurse Practitioner | Admitting: Speech Pathology

## 2018-07-09 ENCOUNTER — Ambulatory Visit: Payer: Medicare Other | Admitting: Physical Therapy

## 2018-07-09 ENCOUNTER — Encounter: Payer: Self-pay | Admitting: Speech Pathology

## 2018-07-09 ENCOUNTER — Encounter: Payer: Self-pay | Admitting: Physical Therapy

## 2018-07-09 DIAGNOSIS — R262 Difficulty in walking, not elsewhere classified: Secondary | ICD-10-CM | POA: Diagnosis present

## 2018-07-09 DIAGNOSIS — R4701 Aphasia: Secondary | ICD-10-CM | POA: Diagnosis present

## 2018-07-09 DIAGNOSIS — M6281 Muscle weakness (generalized): Secondary | ICD-10-CM | POA: Diagnosis present

## 2018-07-09 NOTE — Therapy (Signed)
[place] MAIN [facility] SERVICES [address] [place], [place], [address] Phone: [phone]   Fax:  [phone]  Speech Language Pathology Treatment  Patient Details  Name: Adriana Hughes MRN: [id number] Date of Birth: [birth year] Referring Provider: [name], NP   Encounter Date: 07/09/2018  End of Session - 07/09/18 1459    Visit Number  7    Number of Visits  17    Date for SLP Re-Evaluation  07/30/18    SLP Start Time  1400    SLP Stop Time   1458    SLP Time Calculation (min)  58 min    Activity Tolerance  Patient tolerated treatment well       Past Medical History:  Diagnosis Date  . Arthritis   . Fibrocystic breast disease   . GERD (gastroesophageal reflux disease)   . Hyperlipidemia   . Hypertension     Past Surgical History:  Procedure Laterality Date  . APPENDECTOMY    . BREAST BIOPSY    . BREAST EXCISIONAL BIOPSY Bilateral [id number]   benign  . EYE SURGERY    . TEE WITHOUT CARDIOVERSION N/A 04/21/2018   Procedure: TRANSESOPHAGEAL ECHOCARDIOGRAM (TEE);  Surgeon: Corey Skains, MD;  Location: [facility] ORS;  Service: Cardiovascular;  Laterality: N/A;    There were no vitals filed for this visit.  Subjective Assessment - 07/09/18 1457    Subjective  Patient reported she is doing well and reported she did all of her homework.    Currently in Pain?  No/denies            ADULT SLP TREATMENT - 07/09/18 0001      General Information   Behavior/Cognition  Alert;Cooperative;Pleasant mood    Patient Positioning  Upright in chair    HPI  Per discharge summary from [facility]: Ischemic Stroke s/p alteplase and IA:  Presented initially with symptoms on arrival notable for left gaze deviation, right sided hemiparesis, hemianopia, and right facial droop. Primary stroke risk factors: hypertension. She was found to be a candidate for tPa that she received on 5/15. She was also a candidate for IA and brought in for successful  mechanical thrombectomy with good restoration of blood flow after one pass, TICI 3. Patient monitored in the ICU initially and required intubation due to frequent vomiting and concern for airway protection. She was successfully extubated on 5/16 and transferred to the floor without issues.CT Head revealed left MCA infarct without hemorrhage. 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       Treatment Provided   Treatment provided  Cognitive-Linquistic      Pain Assessment   Pain Assessment  No/denies pain      Cognitive-Linquistic Treatment   Treatment focused on  Aphasia    Skilled Treatment Patient named 4-5 semantic features given pictured objects with 100% accuracy given min to mod verbal cues.  Given written descriptors, patient wrote names of target objects and actions with 95% accuracy given min verbal cues (self correct noted x4).  Patient wrote answers to complete analogies with an appropriate word given min verbal cues with 100% accuracy and with 83% accuracy independently.  Patient wrote target words given description of target with 90% accuracy given min verbal cues for assistance with correction of written phonemic paraphasias when unable to self correct.  Patient manipulated single letter tiles to construct 3-5 letter words given min to mod verbal cues with 90% accuracy and with 80% accuracy independently.  Greatest independent accuracy creating 3 letter words.     Assessment / Recommendations / Plan   Plan  Continue with current plan of care      Progression Toward Goals   Progression toward goals  Progressing toward goals       SLP Education - 07/09/18 1458    Education Details  Compensatory strategies for word finding to aid verbal and written communication    Person(s) Educated  Patient    Methods  Explanation;Demonstration;Handout    Comprehension  Verbalized understanding;Returned demonstration;Verbal cues required         SLP Long Term Goals -  07/02/18 1802      SLP LONG TERM GOAL #1   Title  Patient will describe pictured objects by naming 3 semantic features with 80% accuracy given min verbal cues.    Time  8    Period  Weeks    Status  Partially Met      SLP LONG TERM GOAL #2   Title  Patient will name 10 members in a given category with 80% accuracy given min verbal cues.    Time  8    Period  Weeks    Status  On-going      SLP LONG TERM GOAL #3   Title  Patient will compose a novel sentence when given 1-2 target words with 80% accuracy independently.    Time  8    Period  Weeks    Status  On-going      SLP LONG TERM GOAL #4   Title  Patient will give 3 semantic descriptors when having word finding difficulty in conversation with 100% accuracy given min verbal cues.    Time  8    Period  Weeks    Status  On-going      SLP LONG TERM GOAL #5   Title  Patient will be independent with resonant voice exercises.    Time  8    Period  Weeks    Status  Deferred       Plan - 07/09/18 1459    Clinical Impression Statement  Patient demonstrated decreased number of written phonemic paraphasias throughout treatment, however noted increased number of semantic paraphasias in speech when responding to expressive language tasks. Patient responded well with increased accuracy when SLP identified paraphasias and provided min verbal cues to facilitate self correction. Patient largely able to correct written paraphasias given min verbal cues.   Speech Therapy Frequency  2x / week    Duration  Other (comment)   8 weeks   Treatment/Interventions  Compensatory strategies;SLP instruction and feedback;Patient/family education;Functional tasks    Potential to Achieve Goals  Good    Potential Considerations  Ability to learn/carryover information;Family/community support;Cooperation/participation level    SLP Home Exercise Plan  Word finding exercises, home practice of compensatory strategies for word finding    Consulted and Agree with  Plan of Care  Patient       Patient will benefit from skilled therapeutic intervention in order to improve the following deficits and impairments:   Aphasia    Problem List There are no active problems to display for this patient.   [place], MA, CCC-SLP 07/09/2018, 3:13 PM  [place] MAIN [facility] SERVICES [address] [place], [place], [address] Phone: [phone]   Fax:  [phone]   Name: NORENE OLIVERI MRN: [id number] Date of Birth: [birth year]

## 2018-07-09 NOTE — Therapy (Signed)
[place] [facility] MAIN [facility] SERVICES [address] [place], [place], [address] Phone: [phone]   Fax:  [phone]  Physical Therapy Treatment  Patient Details  Name: Adriana Hughes MRN: [id number] Date of Birth: [birth year] Referring Provider: Clearence Cheek    Encounter Date: 07/09/2018  PT End of Session - 07/09/18 1629    Visit Number  7    Number of Visits  25    Date for PT Re-Evaluation  09/07/18    PT Start Time  0315    PT Stop Time  0400    PT Time Calculation (min)  45 min    Equipment Utilized During Treatment  Gait belt    Activity Tolerance  Patient tolerated treatment well;Patient limited by fatigue    Behavior During Therapy  [facility] for tasks assessed/performed       Past Medical History:  Diagnosis Date  . Arthritis   . Fibrocystic breast disease   . GERD (gastroesophageal reflux disease)   . Hyperlipidemia   . Hypertension     Past Surgical History:  Procedure Laterality Date  . APPENDECTOMY    . BREAST BIOPSY    . BREAST EXCISIONAL BIOPSY Bilateral [id number]   benign  . EYE SURGERY    . TEE WITHOUT CARDIOVERSION N/A 04/21/2018   Procedure: TRANSESOPHAGEAL ECHOCARDIOGRAM (TEE);  Surgeon: Lamar Blinks, MD;  Location: [facility] ORS;  Service: Cardiovascular;  Laterality: N/A;    There were no vitals filed for this visit.  Subjective Assessment - 07/09/18 1502    Subjective  Pt reports that she is doing well today. She denies pain currently and has no specific questions or concerns. She denies recent falls or recent changes in her health. HEP is going well.     Patient is accompained by:  Family member    Pertinent History  Per discharge summary from [facility]: Ischemic Stroke s/p alteplase and IA:  Presented initially with symptoms on arrival notable for left gaze deviation, right sided hemiparesis, hemianopia, and right facial droop. Primary stroke risk factors: hypertension. She was found to be a candidate for tPa that she  received on 5/15. She was also a candidate for IA and brought in for successful mechanical thrombectomy with good restoration of blood flow after one pass, TICI 3. Patient monitored in the ICU initially and required intubation due to frequent vomiting and concern for airway protection. She was successfully extubated on 5/16 and transferred to the floor without issues. Risk stratification obtained while inpatient as follows: LDL: 126, A1C: 5.8, TTE reveals EF >55% without evidence of intracardiac thrombus. 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 Post tPA [facility] 5/15 showed interval left basal ganglia hyperdensity, so repeat CTH ordered on 5/16 showing resolution of finding which likely represented contrast staining from IA procedure     How long can you stand comfortably?  no limits    Patient Stated Goals  Patient wants to be able to walk longer distances and not walk towards the right side.     Currently in Pain?  No/denies       TREATMENT  Neuromuscular Re-education 1/2 foam balance with balloon tapping on mirror alternating LE; Airex balance with feet together with  dynamic reaching alternating UE with 3 lb rod 1/2 foam flat side up static balance  x 2; Tandem standing on floor, and reaching down x 2 on each leg; Matrix with 22.5 lbs x 5  reps  Ther-ex NuStep L4 x 5  minutes Quantum leg press  75 # x 20,   Agility Ladder  UE support x 4 each direction, intermittent minA+1 from therapist to stabilize; Lateral step-ups to 6" step x 10 each direction; Sit to stand without UE support with Airex under feet x 10;  Patient needs occasional verbal cueing to improve posture and cueing to correctly perform exercises slowly, holding at end of range to increase motor firing of desired muscle to encourage fatigue. Intermittent seated rest breaks required secondary to fatigue between  exercises;                          PT Short Term Goals - 06/16/18 1324      PT SHORT TERM GOAL #1   Title  Patient will be independent in home exercise program to improve strength/mobility for better functional independence with ADLs.    Time  6    Period  Weeks    Status  New    Target Date  07/28/18      PT SHORT TERM GOAL #2   Title  Patient will increase six minute walk test distance to >1000 for progression to community ambulator and improve gait ability    Baseline  585 ft    Time  6    Period  Weeks    Status  New    Target Date  07/28/18        PT Long Term Goals - 06/16/18 0810      PT LONG TERM GOAL #1   Title  Patient will increase 10 meter walk test to >1.87m/s as to improve gait speed for better community ambulation and to reduce fall risk.    Time  12    Period  Weeks    Status  New    Target Date  09/08/18      PT LONG TERM GOAL #2   Title  Patient will ascend/descend 4 stairs without rail assist independently without loss of balance to improve ability to get in/out of home.     Baseline  needs rail    Time  12    Period  Weeks    Status  New    Target Date  09/08/18      PT LONG TERM GOAL #3   Title   Patient will demonstrate an improved Berg Balance Score of > as to demonstrate improved balance with ADLs such as sitting/standing and transfer balance and reduced fall risk    Time  12    Period  Weeks    Status  New    Target Date  09/08/18      PT LONG TERM GOAL #4   Title  Patient will increase BLE gross strength to 4+/5 as to improve functional strength for independent gait, increased standing tolerance and increased ADL ability.    Time  12    Period  Weeks    Status  New    Target Date  09/08/18            Plan - 07/09/18 1632    Clinical Impression Statement  Dynamic and static balance interventions continued today, with a focus on unilateral LE stability.  Pt tolerated all exercises well.  Intermediate dynamic  standing balance tasks were progressed with cga needed for reaching outside of her base of support.  Pt would continue to benefit from skilled therapy services in order to address balance  deficits in order to decrease fall risk and improve mobility.    Rehab Potential  Good    PT Frequency  2x / week    PT Duration  12 weeks    PT Treatment/Interventions  Manual techniques;Patient/family education;Balance training;Functional mobility training;Therapeutic activities;Therapeutic exercise;Gait training;Aquatic Therapy       Patient will benefit from skilled therapeutic intervention in order to improve the following deficits and impairments:  Abnormal gait, Decreased balance, Decreased endurance, Decreased mobility, Difficulty walking, Decreased activity tolerance, Decreased safety awareness, Decreased strength  Visit Diagnosis: Muscle weakness (generalized)  Difficulty in walking, not elsewhere classified     Problem List There are no active problems to display for this patient.   [address] , [place] DPT 07/09/2018, 4:34 PM  [place] [facility] MAIN [facility] SERVICES [address] [place], [place], [address] Phone: [phone]   Fax:  [phone]  Name: BETSI CRESPI MRN: [id number] Date of Birth: 1941-01-24

## 2018-07-13 ENCOUNTER — Encounter: Payer: Self-pay | Admitting: Physical Therapy

## 2018-07-13 ENCOUNTER — Ambulatory Visit: Payer: Medicare Other | Admitting: Speech Pathology

## 2018-07-13 ENCOUNTER — Ambulatory Visit: Payer: Medicare Other | Admitting: Physical Therapy

## 2018-07-13 ENCOUNTER — Encounter: Payer: Self-pay | Admitting: Speech Pathology

## 2018-07-13 DIAGNOSIS — R262 Difficulty in walking, not elsewhere classified: Secondary | ICD-10-CM

## 2018-07-13 DIAGNOSIS — R4701 Aphasia: Secondary | ICD-10-CM

## 2018-07-13 DIAGNOSIS — M6281 Muscle weakness (generalized): Secondary | ICD-10-CM

## 2018-07-13 NOTE — Therapy (Signed)
[place] MAIN [facility] SERVICES [address] [place], [place], [address] Phone: [phone]   Fax:  [phone]  Speech Language Pathology Treatment  Patient Details  Name: Adriana Hughes MRN: [id number] Date of Birth: 1941/10/15 Referring Provider: [name], NP   Encounter Date: 07/13/2018  End of Session - 07/13/18 1510    Visit Number  8    Number of Visits  17    Date for SLP Re-Evaluation  07/30/18    SLP Start Time  1400    SLP Stop Time   1455    SLP Time Calculation (min)  55 min    Activity Tolerance  Patient tolerated treatment well       Past Medical History:  Diagnosis Date  . Arthritis   . Fibrocystic breast disease   . GERD (gastroesophageal reflux disease)   . Hyperlipidemia   . Hypertension     Past Surgical History:  Procedure Laterality Date  . APPENDECTOMY    . BREAST BIOPSY    . BREAST EXCISIONAL BIOPSY Bilateral 0175,1025   benign  . EYE SURGERY    . TEE WITHOUT CARDIOVERSION N/A 04/21/2018   Procedure: TRANSESOPHAGEAL ECHOCARDIOGRAM (TEE);  Surgeon: Corey Skains, MD;  Location: [facility] ORS;  Service: Cardiovascular;  Laterality: N/A;    There were no vitals filed for this visit.  Subjective Assessment - 07/13/18 1508    Subjective  Patient reported she had a good weekend and is doing well.  She reported she is a bit tired from just finishing her physical therapy session.    Currently in Pain?  No/denies            ADULT SLP TREATMENT - 07/13/18 0001      General Information   Behavior/Cognition  Alert;Cooperative;Pleasant mood    Patient Positioning  Upright in chair    HPI  Per discharge summary from [facility]: Ischemic Stroke s/p alteplase and IA:  Presented initially with symptoms on arrival notable for left gaze deviation, right sided hemiparesis, hemianopia, and right facial droop. Primary stroke risk factors: hypertension. She was found to be a candidate for tPa that she received on 5/15.  She was also a candidate for IA and brought in for successful mechanical thrombectomy with good restoration of blood flow after one pass, TICI 3. Patient monitored in the ICU initially and required intubation due to frequent vomiting and concern for airway protection. She was successfully extubated on 5/16 and transferred to the floor without issues. . 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       Treatment Provided   Treatment provided  Cognitive-Linquistic      Pain Assessment   Pain Assessment  No/denies pain      Cognitive-Linquistic Treatment   Treatment focused on  Aphasia    Skilled Treatment  Patient named 4-5 semantic features given pictured objects with 100% accuracy given min verbal cues.  Patient named 3-4 semantic features to describe a target word to enable SLP to name target word given semantic features alone with 100% accuracy given min to mod verbal cues. Patient named 1-2 semantic features to describe a target word to SLP with 100% accuracy given visual cues from SFA (semantic feature analysis) chart.     Assessment / Recommendations / Plan   Plan  Continue with current plan of care      Progression Toward Goals   Progression toward goals  Progressing toward goals       SLP Education - 07/13/18 1510    Education Details  Compensatory strategeis for word finding to aid verbal and written communication    Person(s) Educated  Patient    Methods  Explanation;Demonstration;Handout;Verbal cues    Comprehension  Verbalized understanding;Returned demonstration;Need further instruction;Verbal cues required         SLP Long Term Goals - 07/02/18 1802      SLP LONG TERM GOAL #1   Title  Patient will describe pictured objects by naming 3 semantic features with 80% accuracy given min verbal cues.    Time  8    Period  Weeks    Status  Partially Met      SLP LONG TERM GOAL #2   Title  Patient will  name 10 members in a given category with 80% accuracy given min verbal cues.    Time  8    Period  Weeks    Status  On-going      SLP LONG TERM GOAL #3   Title  Patient will compose a novel sentence when given 1-2 target words with 80% accuracy independently.    Time  8    Period  Weeks    Status  On-going      SLP LONG TERM GOAL #4   Title  Patient will give 3 semantic descriptors when having word finding difficulty in conversation with 100% accuracy given min verbal cues.    Time  8    Period  Weeks    Status  On-going      SLP LONG TERM GOAL #5   Title  Patient will be independent with resonant voice exercises.    Time  8    Period  Weeks    Status  Deferred       Plan - 07/13/18 1511    Clinical Impression Statement  Noted emerging independence using semantic feature analysis to aid word finding throughout treatment today.     Speech Therapy Frequency  2x / week    Duration  Other (comment)   8 weeks   Treatment/Interventions  Compensatory strategies;SLP instruction and feedback;Patient/family education;Functional tasks    Potential to Achieve Goals  Good    Potential Considerations  Ability to learn/carryover information;Family/community support;Cooperation/participation level    SLP Home Exercise Plan  Word finding exercises, home practice of compensatory strategies for word finding    Consulted and Agree with Plan of Care  Patient       Patient will benefit from skilled therapeutic intervention in order to improve the following deficits and impairments:   Aphasia    Problem List There are no active problems to display for this patient.   [place], MA, CCC-SLP 07/13/2018, 3:13 PM  [place] MAIN [facility] SERVICES [address] [place], [place], [address] Phone: [phone]   Fax:  [phone]   Name: CHANCE KARAM MRN: [id number] Date of Birth: [birth year]

## 2018-07-13 NOTE — Therapy (Signed)
[place] [facility] MAIN [facility] SERVICES [address] [place], [place], [address] Phone: [phone]   Fax:  [phone]  Physical Therapy Treatment  Patient Details  Name: Adriana Hughes MRN: [id number] Date of Birth: Apr 04, 1941 Referring Provider: Clearence Cheek    Encounter Date: 07/13/2018  PT End of Session - 07/13/18 1316    Visit Number  8    Number of Visits  25    Date for PT Re-Evaluation  09/07/18    PT Start Time  0105    PT Stop Time  0145    PT Time Calculation (min)  40 min    Equipment Utilized During Treatment  Gait belt    Activity Tolerance  Patient tolerated treatment well;Patient limited by fatigue    Behavior During Therapy  [facility] for tasks assessed/performed       Past Medical History:  Diagnosis Date  . Arthritis   . Fibrocystic breast disease   . GERD (gastroesophageal reflux disease)   . Hyperlipidemia   . Hypertension     Past Surgical History:  Procedure Laterality Date  . APPENDECTOMY    . BREAST BIOPSY    . BREAST EXCISIONAL BIOPSY Bilateral 8916,9450   benign  . EYE SURGERY    . TEE WITHOUT CARDIOVERSION N/A 04/21/2018   Procedure: TRANSESOPHAGEAL ECHOCARDIOGRAM (TEE);  Surgeon: Lamar Blinks, MD;  Location: [facility] ORS;  Service: Cardiovascular;  Laterality: N/A;    There were no vitals filed for this visit.  Subjective Assessment - 07/13/18 1315    Subjective  Pt reports that she is doing well today. She denies pain currently and has no specific questions or concerns. She denies recent falls or recent changes in her health. HEP is going well.     Patient is accompained by:  Family member    Pertinent History  Per discharge summary from [facility]: Ischemic Stroke s/p alteplase and IA:  Presented initially with symptoms on arrival notable for left gaze deviation, right sided hemiparesis, hemianopia, and right facial droop. Primary stroke risk factors: hypertension. She was found to be a candidate for tPa that she  received on 5/15. She was also a candidate for IA and brought in for successful mechanical thrombectomy with good restoration of blood flow after one pass, TICI 3. Patient monitored in the ICU initially and required intubation due to frequent vomiting and concern for airway protection. She was successfully extubated on 5/16 and transferred to the floor without issues. Risk stratification obtained while inpatient as follows: LDL: 126, A1C: 5.8, TTE reveals EF >55% without evidence of intracardiac thrombus. 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 Post tPA [facility] 5/15 showed interval left basal ganglia hyperdensity, so repeat CTH ordered on 5/16 showing resolution of finding which likely represented contrast staining from IA procedure     How long can you stand comfortably?  no limits    Patient Stated Goals  Patient wants to be able to walk longer distances and not walk towards the right side.     Currently in Pain?  No/denies    Multiple Pain Sites  No       Rockerboard Lateral weight shift x10 reps each direction, no UE support, CGA for safety with VCs to control the board tap in each direction and not letting it hit too hard  AP weight shift x10 reps, no UE support, CGA for safety with VCs to utilize ankles to  transfer weight over toes and back over heels without just leaning the trunk Airex pad ball tosses to self x2 min, CGA for safety, demonstrated difficulty with catching the ball on its return and required VCs to control the speed of toss to control the return speed Airex pad, balloon tapping  x2 min, supervision for safety with varying directions and speed of balloon, VCs for utilizing both hands and minimizing UE support Agility ladder Step out, out, in, in forwards x2 laps, backwards x2 laps, CGA for safety, VCs to take big enough steps and to try to increase speed to work on coordination  Side stepping x1 lap each  direction, CGA for safety, VCs for taking a big enough step to get both feet into the square  Gait out in hallway, CGA for all activities with VCs for maintaining gait speed and step length with activities: -Horizontal head turns x160 ft, calling out cards as walking to give point of focus -Vertical head turns x160 ft -Direction changes x80 ft, some difficulty with changing directions quickly and beginning to walk backwards -Speed changes x160 ft, some difficulty with slower gait speed and maintaining balance but good increase in gait speed   TM walking elevation 1 at . 3 m/sec x 2 mins left and 2 mins right , cues for correct foot position                       PT Education - 07/13/18 1316    Education Details  saftey, balance, HEP    Person(s) Educated  Patient    Methods  Explanation;Tactile cues;Verbal cues    Comprehension  Verbalized understanding;Need further instruction       PT Short Term Goals - 06/16/18 1610      PT SHORT TERM GOAL #1   Title  Patient will be independent in home exercise program to improve strength/mobility for better functional independence with ADLs.    Time  6    Period  Weeks    Status  New    Target Date  07/28/18      PT SHORT TERM GOAL #2   Title  Patient will increase six minute walk test distance to >1000 for progression to community ambulator and improve gait ability    Baseline  585 ft    Time  6    Period  Weeks    Status  New    Target Date  07/28/18        PT Long Term Goals - 06/16/18 0810      PT LONG TERM GOAL #1   Title  Patient will increase 10 meter walk test to >1.68m/s as to improve gait speed for better community ambulation and to reduce fall risk.    Time  12    Period  Weeks    Status  New    Target Date  09/08/18      PT LONG TERM GOAL #2   Title  Patient will ascend/descend 4 stairs without rail assist independently without loss of balance to improve ability to get in/out of home.     Baseline   needs rail    Time  12    Period  Weeks    Status  New    Target Date  09/08/18      PT LONG TERM GOAL #3   Title   Patient will demonstrate an improved Berg Balance Score of > as to demonstrate improved balance with ADLs such  as sitting/standing and transfer balance and reduced fall risk    Time  12    Period  Weeks    Status  New    Target Date  09/08/18      PT LONG TERM GOAL #4   Title  Patient will increase BLE gross strength to 4+/5 as to improve functional strength for independent gait, increased standing tolerance and increased ADL ability.    Time  12    Period  Weeks    Status  New    Target Date  09/08/18            Plan - 07/13/18 1316    Clinical Impression Statement  Dynamic and static balance interventions continued today, with a focus on unilateral LE stability.  Pt tolerated all exercises well.  Intermediate dynamic standing balance tasks were progressed with cga needed for reaching outside of her base of support.  Pt would continue to benefit from skilled therapy services in order to address balance deficits in order to decrease fall risk and improve mobility    Rehab Potential  Good    PT Frequency  2x / week    PT Duration  12 weeks    PT Treatment/Interventions  Manual techniques;Patient/family education;Balance training;Functional mobility training;Therapeutic activities;Therapeutic exercise;Gait training;Aquatic Therapy       Patient will benefit from skilled therapeutic intervention in order to improve the following deficits and impairments:  Abnormal gait, Decreased balance, Decreased endurance, Decreased mobility, Difficulty walking, Decreased activity tolerance, Decreased safety awareness, Decreased strength  Visit Diagnosis: Muscle weakness (generalized)  Difficulty in walking, not elsewhere classified  Aphasia     Problem List There are no active problems to display for this patient.   [address] [place] DPT 07/13/2018, 1:21  PM  [place] [facility] MAIN [facility] SERVICES [address] [place], [place], [address] Phone: [phone]   Fax:  [phone]  Name: LIDDY DEAM MRN: [id number] Date of Birth: 12-Feb-1941

## 2018-07-14 ENCOUNTER — Ambulatory Visit: Payer: Medicare Other | Admitting: Physical Therapy

## 2018-07-16 ENCOUNTER — Ambulatory Visit: Payer: Medicare Other | Admitting: Physical Therapy

## 2018-07-16 ENCOUNTER — Ambulatory Visit: Payer: Medicare Other | Admitting: Speech Pathology

## 2018-07-16 ENCOUNTER — Encounter: Payer: Self-pay | Admitting: Physical Therapy

## 2018-07-16 ENCOUNTER — Encounter: Payer: Self-pay | Admitting: Speech Pathology

## 2018-07-16 DIAGNOSIS — M6281 Muscle weakness (generalized): Secondary | ICD-10-CM

## 2018-07-16 DIAGNOSIS — R4701 Aphasia: Secondary | ICD-10-CM

## 2018-07-16 DIAGNOSIS — R262 Difficulty in walking, not elsewhere classified: Secondary | ICD-10-CM

## 2018-07-16 NOTE — Therapy (Signed)
[place] [facility] MAIN [facility] SERVICES [address] [place], [place], [address] Phone: [phone]   Fax:  [phone]  Physical Therapy Treatment  Patient Details  Name: Adriana Hughes MRN: [id number] Date of Birth: Jan 19, 1941 Referring Provider: Clearence Cheek    Encounter Date: 07/16/2018  PT End of Session - 07/16/18 1530    Visit Number  9    Number of Visits  25    Date for PT Re-Evaluation  09/07/18    Authorization Type  9/10    PT Start Time  0200    PT Stop Time  0230    PT Time Calculation (min)  30 min    Equipment Utilized During Treatment  Gait belt    Activity Tolerance  Patient tolerated treatment well;Patient limited by fatigue    Behavior During Therapy  [facility] for tasks assessed/performed       Past Medical History:  Diagnosis Date  . Arthritis   . Fibrocystic breast disease   . GERD (gastroesophageal reflux disease)   . Hyperlipidemia   . Hypertension     Past Surgical History:  Procedure Laterality Date  . APPENDECTOMY    . BREAST BIOPSY    . BREAST EXCISIONAL BIOPSY Bilateral 1324,4010   benign  . EYE SURGERY    . TEE WITHOUT CARDIOVERSION N/A 04/21/2018   Procedure: TRANSESOPHAGEAL ECHOCARDIOGRAM (TEE);  Surgeon: Lamar Blinks, MD;  Location: [facility] ORS;  Service: Cardiovascular;  Laterality: N/A;    There were no vitals filed for this visit.  Subjective Assessment - 07/16/18 1530    Subjective  Pt reports that she is doing well today. She denies pain currently and has no specific questions or concerns. She denies recent falls or recent changes in her health. HEP is going well.     Patient is accompained by:  Family member    Pertinent History  Per discharge summary from [facility]: Ischemic Stroke s/p alteplase and IA:  Presented initially with symptoms on arrival notable for left gaze deviation, right sided hemiparesis, hemianopia, and right facial droop. Primary stroke risk factors: hypertension. She was found to be a  candidate for tPa that she received on 5/15. She was also a candidate for IA and brought in for successful mechanical thrombectomy with good restoration of blood flow after one pass, TICI 3. Patient monitored in the ICU initially and required intubation due to frequent vomiting and concern for airway protection. She was successfully extubated on 5/16 and transferred to the floor without issues. Risk stratification obtained while inpatient as follows: LDL: 126, A1C: 5.8, TTE reveals EF >55% without evidence of intracardiac thrombus. 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 Post tPA [facility] 5/15 showed interval left basal ganglia hyperdensity, so repeat CTH ordered on 5/16 showing resolution of finding which likely represented contrast staining from IA procedure     How long can you stand comfortably?  no limits    Patient Stated Goals  Patient wants to be able to walk longer distances and not walk towards the right side.     Currently in Pain?  No/denies         Rockerboard Lateral weight shift x10 reps each direction, no UE support, CGA for safety with VCs to control the board tap in each direction and not letting it hit too hard  AP weight shift x10 reps, no UE support, CGA for safety with VCs to utilize ankles  to transfer weight over toes and back over heels without just leaning the trunk Airex pad ball tosses to self x2 min, CGA for safety, demonstrated difficulty with catching the ball on its return and required VCs to control the speed of toss to control the return speed Airex pad, balloon passes x2 min, supervision for safety with varying directions and speed of balloon, VCs for utilizing both hands and minimizing UE support Agility ladder with min assist x 3 laps  Step out, out, in, in forwards x2 laps, backwards x2 laps, CGA for safety, VCs to take big enough steps and to try to increase speed to work on coordination  Side  stepping x1 lap each direction, CGA for safety, VCs for taking a big enough step to get both feet into the square                        PT Education - 07/16/18 1530    Education Details  Saftey, balance    Person(s) Educated  Patient    Methods  Explanation;Demonstration    Comprehension  Verbalized understanding;Returned demonstration;Need further instruction       PT Short Term Goals - 06/16/18 4098      PT SHORT TERM GOAL #1   Title  Patient will be independent in home exercise program to improve strength/mobility for better functional independence with ADLs.    Time  6    Period  Weeks    Status  New    Target Date  07/28/18      PT SHORT TERM GOAL #2   Title  Patient will increase six minute walk test distance to >1000 for progression to community ambulator and improve gait ability    Baseline  585 ft    Time  6    Period  Weeks    Status  New    Target Date  07/28/18        PT Long Term Goals - 06/16/18 0810      PT LONG TERM GOAL #1   Title  Patient will increase 10 meter walk test to >1.54m/s as to improve gait speed for better community ambulation and to reduce fall risk.    Time  12    Period  Weeks    Status  New    Target Date  09/08/18      PT LONG TERM GOAL #2   Title  Patient will ascend/descend 4 stairs without rail assist independently without loss of balance to improve ability to get in/out of home.     Baseline  needs rail    Time  12    Period  Weeks    Status  New    Target Date  09/08/18      PT LONG TERM GOAL #3   Title   Patient will demonstrate an improved Berg Balance Score of > as to demonstrate improved balance with ADLs such as sitting/standing and transfer balance and reduced fall risk    Time  12    Period  Weeks    Status  New    Target Date  09/08/18      PT LONG TERM GOAL #4   Title  Patient will increase BLE gross strength to 4+/5 as to improve functional strength for independent gait, increased standing  tolerance and increased ADL ability.    Time  12    Period  Weeks    Status  New  Target Date  09/08/18            Plan - 07/16/18 1531    Clinical Impression Statement  Patient demonstrated improved coordination with increased repetition and practice. Initiation of new task requires verbal, visual, and demonstrative cueing to be performed with tactile correction. Decreased correction and cueing was needed as task was repeated. Patient able to perform open and closed chain exercises.  Patient would benefit from additional skilled PT intervention to improve coordination and balance.    Rehab Potential  Good    PT Frequency  2x / week    PT Duration  12 weeks    PT Treatment/Interventions  Manual techniques;Patient/family education;Balance training;Functional mobility training;Therapeutic activities;Therapeutic exercise;Gait training;Aquatic Therapy       Patient will benefit from skilled therapeutic intervention in order to improve the following deficits and impairments:  Abnormal gait, Decreased balance, Decreased endurance, Decreased mobility, Difficulty walking, Decreased activity tolerance, Decreased safety awareness, Decreased strength  Visit Diagnosis: Aphasia  Muscle weakness (generalized)  Difficulty in walking, not elsewhere classified     Problem List There are no active problems to display for this patient.   [address], Kristine S, [place] DPT 07/16/2018, 3:32 PM  [place] [facility] [address] [place], [place], [address] Phone: [phone]   Fax:  [phone]  Name: Joylene GrapesSue R Verhagen MRN: [id number] Date of Birth: 1941/05/28

## 2018-07-16 NOTE — Therapy (Signed)
[place] MAIN [facility] SERVICES [address] [place], [place], [address] Phone: [phone]   Fax:  [phone]  Speech Language Pathology Treatment  Patient Details  Name: Adriana Hughes MRN: [id number] Date of Birth: Feb 04, 1941 Referring Provider: [name], NP   Encounter Date: 07/16/2018  End of Session - 07/16/18 1515    Visit Number  9    Number of Visits  17    Date for SLP Re-Evaluation  07/30/18    SLP Start Time  7939    SLP Stop Time   1355    SLP Time Calculation (min)  53 min    Activity Tolerance  Patient tolerated treatment well       Past Medical History:  Diagnosis Date  . Arthritis   . Fibrocystic breast disease   . GERD (gastroesophageal reflux disease)   . Hyperlipidemia   . Hypertension     Past Surgical History:  Procedure Laterality Date  . APPENDECTOMY    . BREAST BIOPSY    . BREAST EXCISIONAL BIOPSY Bilateral 0300,9233   benign  . EYE SURGERY    . TEE WITHOUT CARDIOVERSION N/A 04/21/2018   Procedure: TRANSESOPHAGEAL ECHOCARDIOGRAM (TEE);  Surgeon: Corey Skains, MD;  Location: [facility] ORS;  Service: Cardiovascular;  Laterality: N/A;    There were no vitals filed for this visit.  Subjective Assessment - 07/16/18 1514    Subjective  Patient reported she is doing well today and reported she did all of her homework.    Currently in Pain?  No/denies            ADULT SLP TREATMENT - 07/16/18 0001      General Information   Behavior/Cognition  Alert;Cooperative;Pleasant mood    Patient Positioning  Upright in chair    HPI  Per discharge summary from [facility]: Ischemic Stroke s/p alteplase and IA:  Presented initially with symptoms on arrival notable for left gaze deviation, right sided hemiparesis, hemianopia, and right facial droop. Primary stroke risk factors: hypertension. She was found to be a candidate for tPa that she received on 5/15. She was also a candidate for IA and brought in for  successful mechanical thrombectomy with good restoration of blood flow after one pass, TICI 3. Patient monitored in the ICU initially and required intubation due to frequent vomiting and concern for airway protection. She was successfully extubated on 5/16 and transferred to the floor without issues.  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       Treatment Provided   Treatment provided  Cognitive-Linquistic      Pain Assessment   Pain Assessment  No/denies pain      Cognitive-Linquistic Treatment   Treatment focused on  Aphasia    Skilled Treatment Patient wrote answers to complete analogies with an appropriate word given min verbal cues with 100% accuracy and with 70% accuracy independently.  Patient named synonyms with 100% accuracy given min to mod verbal cues and 60% accuracy independently.  Patient rewrote sentences to paraphrase information given with 100% accuracy given min to mod verbal cues.  Patient named 3-4 semantic features to describe a target word to enable SLP to name target word given semantic features alone with 100% accuracy given min to mod verbal cues and visual cues from SFA (semantic feature analysis) chart.     Assessment / Recommendations / Plan   Plan  Continue with current plan of  care      Progression Toward Goals   Progression toward goals  Progressing toward goals       SLP Education - 07/16/18 1514    Education Details  Compensatory strategies for word finding to aid verbal and written communication    Person(s) Educated  Patient    Methods  Explanation;Demonstration;Verbal cues;Handout    Comprehension  Verbalized understanding;Returned demonstration;Verbal cues required;Need further instruction         SLP Long Term Goals - 07/02/18 1802      SLP LONG TERM GOAL #1   Title  Patient will describe pictured objects by naming 3 semantic features with 80% accuracy given min  verbal cues.    Time  8    Period  Weeks    Status  Partially Met      SLP LONG TERM GOAL #2   Title  Patient will name 10 members in a given category with 80% accuracy given min verbal cues.    Time  8    Period  Weeks    Status  On-going      SLP LONG TERM GOAL #3   Title  Patient will compose a novel sentence when given 1-2 target words with 80% accuracy independently.    Time  8    Period  Weeks    Status  On-going      SLP LONG TERM GOAL #4   Title  Patient will give 3 semantic descriptors when having word finding difficulty in conversation with 100% accuracy given min verbal cues.    Time  8    Period  Weeks    Status  On-going      SLP LONG TERM GOAL #5   Title  Patient will be independent with resonant voice exercises.    Time  8    Period  Weeks    Status  Deferred       Plan - 07/16/18 1515    Clinical Impression Statement Patient demonstrated consistent accuracy on word finding tasks using semantic feature analysis (SFA). Patient requires semi regular cues to use SFA chart to aid word finding throughout treatment tasks.    Speech Therapy Frequency  2x / week    Duration  Other (comment)   8 weeks   Treatment/Interventions  Compensatory strategies;SLP instruction and feedback;Patient/family education;Functional tasks    Potential to Achieve Goals  Good    Potential Considerations  Ability to learn/carryover information;Family/community support;Cooperation/participation level    SLP Home Exercise Plan  Word finding exercises, home practice of compensatory strategies for word finding       Patient will benefit from skilled therapeutic intervention in order to improve the following deficits and impairments:   Aphasia    Problem List There are no active problems to display for this patient.   [place], MA, CCC-SLP 07/16/2018, 3:18 PM  [place] MAIN [facility] SERVICES [address] [place], [place], [address] Phone:  [phone]   Fax:  [phone]   Name: Adriana Hughes MRN: [id number] Date of Birth: [birth year]

## 2018-07-20 ENCOUNTER — Ambulatory Visit: Payer: Medicare Other | Admitting: Speech Pathology

## 2018-07-20 ENCOUNTER — Ambulatory Visit: Payer: Medicare Other | Admitting: Physical Therapy

## 2018-07-20 ENCOUNTER — Encounter: Payer: Self-pay | Admitting: Speech Pathology

## 2018-07-20 ENCOUNTER — Encounter: Payer: Self-pay | Admitting: Physical Therapy

## 2018-07-20 DIAGNOSIS — R262 Difficulty in walking, not elsewhere classified: Secondary | ICD-10-CM

## 2018-07-20 DIAGNOSIS — R4701 Aphasia: Secondary | ICD-10-CM

## 2018-07-20 DIAGNOSIS — M6281 Muscle weakness (generalized): Secondary | ICD-10-CM

## 2018-07-20 NOTE — Therapy (Addendum)
[place] [address] MAIN [facility] [address] [place], [place], [address] Phone: [phone]   Fax:  [phone]  Physical Therapy Treatment/ Physical Therapy Progress Note   Dates of reporting period  06/15/18   to  07/22/18  Patient Details  Name: Adriana Hughes MRN: [id number] Date of Birth: December 28, 1940 Referring Provider: Clearence CheekWILSON, SUSAN    Encounter Date: 07/20/2018  PT End of Session - 07/20/18 1528    Visit Number  10    Number of Visits  25    Date for PT Re-Evaluation  09/07/18    Authorization Type  10/10    PT Start Time  0315    PT Stop Time  0400    PT Time Calculation (min)  45 min    Equipment Utilized During Treatment  Gait belt    Activity Tolerance  Patient tolerated treatment well;Patient limited by fatigue    Behavior During Therapy  WFL for tasks assessed/performed       Past Medical History:  Diagnosis Date  . Arthritis   . Fibrocystic breast disease   . GERD (gastroesophageal reflux disease)   . Hyperlipidemia   . Hypertension     Past Surgical History:  Procedure Laterality Date  . APPENDECTOMY    . BREAST BIOPSY    . BREAST EXCISIONAL BIOPSY Bilateral [id number]   benign  . EYE SURGERY    . TEE WITHOUT CARDIOVERSION N/A 04/21/2018   Procedure: TRANSESOPHAGEAL ECHOCARDIOGRAM (TEE);  Surgeon: Lamar BlinksKowalski, Bruce J, MD;  Location: [facility] ORS;  Service: Cardiovascular;  Laterality: N/A;    There were no vitals filed for this visit.  Subjective Assessment - 07/20/18 1528    Subjective  Pt reports that she is doing well today. She denies pain currently and has no specific questions or concerns. She denies recent falls or recent changes in her health. HEP is going well.     Patient is accompained by:  Family member    Pertinent History  [facility] Ischemic Stroke s/p alteplase and IA:  Presented initially with symptoms on arrival notable for left gaze deviation, right sided hemiparesis, hemianopia,  and right facial droop. Primary stroke risk factors: hypertension. She was found to be a candidate for tPa that she received on 5/15. She was also a candidate for IA and brought in for successful mechanical thrombectomy with good restoration of blood flow after one pass, TICI 3. Patient monitored in the ICU initially and required intubation due to frequent vomiting and concern for airway protection. She was successfully extubated on 5/16 and transferred to the floor without issues. Risk stratification obtained while inpatient as follows: LDL: 126, A1C: 5.8, TTE reveals EF >55% without evidence of intracardiac thrombus. 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 [facility] showed interval left basal ganglia hyperdensity, so repeat CTH ordered on 5/16 showing resolution of finding which likely represented contrast staining from IA procedure     How long can you stand comfortably?  no limits    Patient Stated Goals  Patient wants to be able to walk longer distances and not walk towards the right side.     Currently in Pain?  No/denies    Multiple Pain Sites  No        Neuromuscular Re-education   Airex balance with toe taps to 6" step alternating LE x 10 each, faded UE support , continues to need  UE support 75%  Static balance on 1/2 bolster (flat side up) with no UE support 30 s x 2   Rockerboard: Lateral weight shift x10 reps each direction, no UE support, CGA for safety with VCs to control the board tap in each direction and not letting it hit too hard   Feet apart on BOSU ball and head turns  x10 reps,  UE support, CGA for safety with VCs to utilize core  Airex pad  Head turns  x2 min, CGA for safety, demonstrated difficulty with catching the ball on its return and required VCs to control the speed of toss to control the return speed  Airex pad, balloon passes x2 min, supervision for safety with varying directions and  speed of balloon, VCs for utilizing both hands and minimizing UE support Agility ladder with Min assist  BOSU ball lunges with single UE support x 15 BLE , cues and modeling / demonstration for instructions    TM walking bwd with min assist x 5 mins . 4 miles / hour  Matrix side stepping and bwd walking x 5 mins   CGA and Min to mod verbal cues used throughout with increased in postural sway and LOB most seen with narrow base of support and while on uneven surfaces. Continues to have balance deficits typical with diagnosis. Patient performs intermediate level exercises without pain behaviors and needs verbal cuing for postural alignment and head positioning                           PT Short Term Goals - 06/16/18 0808      PT SHORT TERM GOAL #1   Title  Patient will be independent in home exercise program to improve strength/mobility for better functional independence with ADLs.    Time  6    Period  Weeks    Status  New    Target Date  07/28/18      PT SHORT TERM GOAL #2   Title  Patient will increase six minute walk test distance to >1000 for progression to community ambulator and improve gait ability    Baseline  585 ft    Time  6    Period  Weeks    Status  New    Target Date  07/28/18        PT Long Term Goals - 07/20/18 1528      PT LONG TERM GOAL #1   Title  Patient will increase 10 meter walk test to >1.29m/s as to improve gait speed for better community ambulation and to reduce fall risk.    Baseline  07/20/18= .95 m/sec    Time  12    Period  Weeks    Status  On-going    Target Date  09/07/18      PT LONG TERM GOAL #2   Title  Patient will ascend/descend 4 stairs without rail assist independently without loss of balance to improve ability to get in/out of home.     Baseline  , needs rail 07/20/18    Time  12    Period  Weeks    Status  On-going    Target Date  09/07/18      PT LONG TERM GOAL #3   Title   Patient will demonstrate an  improved Berg Balance Score of > as to demonstrate improved balance with ADLs such as sitting/standing and transfer balance and reduced fall risk  Baseline  49/56 eval, 52/56 07/20/18    Time  12    Period  Weeks    Status  On-going    Target Date  09/07/18      PT LONG TERM GOAL #4   Title  Patient will increase BLE gross strength to 4+/5 as to improve functional strength for independent gait, increased standing tolerance and increased ADL ability.    Baseline  4/5 BLE 07/20/18    Time  12    Period  Weeks    Status  On-going    Target Date  09/07/18            Plan - 07/20/18 1554    Clinical Impression Statement Patient's condition has the potential to improve in response to therapy. Maximum improvement is yet to be obtained. The anticipated improvement is attainable and reasonable in a generally predictable time.  Patient reports being able to be more steady on her feet.  Patient demonstrates LOB with standing balance exercises indicating decreased balancing strategies. Patient did require UE support to perform sidestepping up and over exercise. Patient will benefit from further skilled therapy to return to prior level of function. .  Pt was encouraged to perform HEP during the week in order to continue progressing balance and strength interventions.  Pt would continue to benefit from skilled therapy services in order to further address LE strength deficits and balance deficits in order to decrease fall risk and improve mobility.    Rehab Potential  Good    PT Frequency  2x / week    PT Duration  12 weeks    PT Treatment/Interventions  Manual techniques;Patient/family education;Balance training;Functional mobility training;Therapeutic activities;Therapeutic exercise;Gait training;Aquatic Therapy       Patient will benefit from skilled therapeutic intervention in order to improve the following deficits and impairments:  Abnormal gait, Decreased balance, Decreased endurance,  Decreased mobility, Difficulty walking, Decreased activity tolerance, Decreased safety awareness, Decreased strength  Visit Diagnosis: Aphasia  Muscle weakness (generalized)  Difficulty in walking, not elsewhere classified     Problem List There are no active problems to display for this patient.   [address], [place] DPT 07/20/2018, 3:55 PM  [place] [facility] MAIN [facility] SERVICES [address] [place], [place], [address] Phone: [phone]   Fax:  [phone]  Name: Adriana Hughes MRN: [id number] Date of Birth: 10/19/1941

## 2018-07-20 NOTE — Therapy (Signed)
[place] MAIN [facility] SERVICES [address] [place], [place], [address] Phone: [phone]   Fax:  [phone]  Speech Language Pathology Treatment  Patient Details  Name: Adriana Hughes MRN: [id number] Date of Birth: [birth year] Referring Provider: [name], NP   Encounter Date: 07/20/2018  End of Session - 07/20/18 1528    Visit Number  10    Number of Visits  17    Date for SLP Re-Evaluation  07/30/18    SLP Start Time  8413    SLP Stop Time   1453    SLP Time Calculation (min)  50 min    Activity Tolerance  Patient tolerated treatment well       Past Medical History:  Diagnosis Date  . Arthritis   . Fibrocystic breast disease   . GERD (gastroesophageal reflux disease)   . Hyperlipidemia   . Hypertension     Past Surgical History:  Procedure Laterality Date  . APPENDECTOMY    . BREAST BIOPSY    . BREAST EXCISIONAL BIOPSY Bilateral 2440,1027   benign  . EYE SURGERY    . TEE WITHOUT CARDIOVERSION N/A 04/21/2018   Procedure: TRANSESOPHAGEAL ECHOCARDIOGRAM (TEE);  Surgeon: Corey Skains, MD;  Location: [facility] ORS;  Service: Cardiovascular;  Laterality: N/A;    There were no vitals filed for this visit.  Subjective Assessment - 07/20/18 1527    Subjective  Patient reported she did not do much this weekend but that she completed all of her homework.    Currently in Pain?  No/denies            ADULT SLP TREATMENT - 07/20/18 0001      General Information   Behavior/Cognition  Alert;Cooperative;Pleasant mood    Patient Positioning  Upright in chair    HPI  Per discharge summary from [facility]: Ischemic Stroke s/p alteplase and IA:  Presented initially with symptoms on arrival notable for left gaze deviation, right sided hemiparesis, hemianopia, and right facial droop. Primary stroke risk factors: hypertension. She was found to be a candidate for tPa that she received on 5/15. She was also a candidate for IA and brought in  for successful mechanical thrombectomy with good restoration of blood flow after one pass, TICI 3. Patient monitored in the ICU initially and required intubation due to frequent vomiting and concern for airway protection. She was successfully extubated on 5/16 and transferred to the floor without issues.  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       Treatment Provided   Treatment provided  Cognitive-Linquistic      Pain Assessment   Pain Assessment  No/denies pain      Cognitive-Linquistic Treatment   Treatment focused on  Aphasia    Skilled Treatment Patient named and wrote synonyms with 100% accuracy given min to mod verbal cues and 41% accuracy independently. Patient named and wrote antonyms with 90% accuracy independently.  Patient rewrote given sentences to paraphrase information given with 100% accuracy given mod verbal cues.  Patient named 5 semantic features given pictured objects with 100% accuracy given min to moderate verbal cues and visual cues via semantic feature analysis chart.     Assessment / Recommendations / Plan   Plan  Continue with current plan of care      Progression Toward Goals   Progression toward goals  Progressing toward goals       SLP  Education - 07/20/18 1528    Education Details  compesatory strategies for word finding to aid verbal and written communication    Person(s) Educated  Patient    Methods  Explanation;Demonstration;Verbal cues    Comprehension  Verbalized understanding;Need further instruction;Returned demonstration;Verbal cues required         SLP Long Term Goals - 07/02/18 1802      SLP LONG TERM GOAL #1   Title  Patient will describe pictured objects by naming 3 semantic features with 80% accuracy given min verbal cues.    Time  8    Period  Weeks    Status  Partially Met      SLP LONG TERM GOAL #2   Title  Patient will name 10 members in a given category  with 80% accuracy given min verbal cues.    Time  8    Period  Weeks    Status  On-going      SLP LONG TERM GOAL #3   Title  Patient will compose a novel sentence when given 1-2 target words with 80% accuracy independently.    Time  8    Period  Weeks    Status  On-going      SLP LONG TERM GOAL #4   Title  Patient will give 3 semantic descriptors when having word finding difficulty in conversation with 100% accuracy given min verbal cues.    Time  8    Period  Weeks    Status  On-going      SLP LONG TERM GOAL #5   Title  Patient will be independent with resonant voice exercises.    Time  8    Period  Weeks    Status  Deferred       Plan - 07/20/18 1529    Clinical Impression Statement  Patient required moderately more verbal cues to complete word finding tasks with accuracy today; however noted emerging independence naming semantic features to describe target items. Min to mod cues required to prompt patient to use compensatory strategies to aid word finding.    Speech Therapy Frequency  2x / week    Duration  Other (comment)   8 weeks   Treatment/Interventions  Compensatory strategies;SLP instruction and feedback;Patient/family education;Functional tasks    Potential to Achieve Goals  Good    Potential Considerations  Ability to learn/carryover information;Family/community support;Cooperation/participation level    SLP Home Exercise Plan  Word finding exercises, home practice of compensatory strategies for word finding    Consulted and Agree with Plan of Care  Patient       Patient will benefit from skilled therapeutic intervention in order to improve the following deficits and impairments:   Aphasia    Problem List There are no active problems to display for this patient.   [place], MA, CCC-SLP 07/20/2018, 3:36 PM  [place] MAIN [facility] SERVICES [address] [place], [place], [address] Phone: [phone]   Fax:   [phone]   Name: Adriana Hughes MRN: [id number] Date of Birth: Mar 12, 1941

## 2018-07-23 ENCOUNTER — Ambulatory Visit: Payer: Medicare Other | Admitting: Physical Therapy

## 2018-07-23 ENCOUNTER — Ambulatory Visit: Payer: Medicare Other | Admitting: Speech Pathology

## 2018-07-23 ENCOUNTER — Encounter: Payer: Self-pay | Admitting: Physical Therapy

## 2018-07-23 ENCOUNTER — Encounter: Payer: Self-pay | Admitting: Speech Pathology

## 2018-07-23 DIAGNOSIS — R262 Difficulty in walking, not elsewhere classified: Secondary | ICD-10-CM

## 2018-07-23 DIAGNOSIS — M6281 Muscle weakness (generalized): Secondary | ICD-10-CM

## 2018-07-23 DIAGNOSIS — R4701 Aphasia: Secondary | ICD-10-CM | POA: Diagnosis not present

## 2018-07-23 NOTE — Therapy (Signed)
[place] MAIN [facility] SERVICES [address] [place], [place], [address] Phone: [phone]   Fax:  [phone]  Speech Language Pathology Treatment  Patient Details  Name: Adriana Hughes MRN: [id number] Date of Birth: 1941-04-20 Referring Provider: [name], NP   Encounter Date: 07/23/2018  End of Session - 07/23/18 1551    Visit Number  11    Number of Visits  17    Date for SLP Re-Evaluation  07/30/18    SLP Start Time  38    SLP Stop Time   1356    SLP Time Calculation (min)  50 min    Activity Tolerance  Patient tolerated treatment well       Past Medical History:  Diagnosis Date  . Arthritis   . Fibrocystic breast disease   . GERD (gastroesophageal reflux disease)   . Hyperlipidemia   . Hypertension     Past Surgical History:  Procedure Laterality Date  . APPENDECTOMY    . BREAST BIOPSY    . BREAST EXCISIONAL BIOPSY Bilateral [id number]   benign  . EYE SURGERY    . TEE WITHOUT CARDIOVERSION N/A 04/21/2018   Procedure: TRANSESOPHAGEAL ECHOCARDIOGRAM (TEE);  Surgeon: Corey Skains, MD;  Location: [facility] ORS;  Service: Cardiovascular;  Laterality: N/A;    There were no vitals filed for this visit.  Subjective Assessment - 07/23/18 1549    Subjective  Patient reported her homework was hard!    Currently in Pain?  No/denies            ADULT SLP TREATMENT - 07/23/18 0001      General Information   Behavior/Cognition  Alert;Cooperative;Pleasant mood    Patient Positioning  Upright in chair    HPI  Per discharge summary from [facility]: Ischemic Stroke s/p alteplase and IA:  Presented initially with symptoms on arrival notable for left gaze deviation, right sided hemiparesis, hemianopia, and right facial droop. Primary stroke risk factors: hypertension. She was found to be a candidate for tPa that she received on 5/15. She was also a candidate for IA and brought in for successful mechanical thrombectomy with good  restoration of blood flow after one pass, TICI 3. Patient monitored in the ICU initially and required intubation due to frequent vomiting and concern for airway protection. She was successfully extubated on 5/16 and transferred to the floor without issues.  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       Treatment Provided   Treatment provided  Cognitive-Linquistic      Pain Assessment   Pain Assessment  No/denies pain      Cognitive-Linquistic Treatment   Treatment focused on  Aphasia    Skilled Treatment  Patient named 4-5 semantic features given pictured objects with 100% accuracy given occasional min verbal cues. Patient named 3-4 semantic features to describe a target word to enable SLP to name target word given semantic features alone with 100% accuracy given visual cues from SFA (semantic feature analysis) chart and occasional min verbal cues. Patient named 13 members in a given category with 100% accuracy given min verbal cues (named 8-10 independently depending on the category named).     Assessment / Recommendations / Plan   Plan  Continue with current plan of care      Progression Toward Goals   Progression toward goals  Progressing toward goals       SLP Education -  07/23/18 1550    Education Details  compensatory strategies for word finding to aid verbal and written communication    Person(s) Educated  Patient    Methods  Explanation;Demonstration;Verbal cues    Comprehension  Verbalized understanding;Need further instruction;Returned demonstration;Verbal cues required         SLP Long Term Goals - 07/02/18 1802      SLP LONG TERM GOAL #1   Title  Patient will describe pictured objects by naming 3 semantic features with 80% accuracy given min verbal cues.    Time  8    Period  Weeks    Status  Partially Met      SLP LONG TERM GOAL #2   Title  Patient will name 10 members in a given category  with 80% accuracy given min verbal cues.    Time  8    Period  Weeks    Status  On-going      SLP LONG TERM GOAL #3   Title  Patient will compose a novel sentence when given 1-2 target words with 80% accuracy independently.    Time  8    Period  Weeks    Status  On-going      SLP LONG TERM GOAL #4   Title  Patient will give 3 semantic descriptors when having word finding difficulty in conversation with 100% accuracy given min verbal cues.    Time  8    Period  Weeks    Status  On-going      SLP LONG TERM GOAL #5   Title  Patient will be independent with resonant voice exercises.    Time  8    Period  Weeks    Status  Deferred       Plan - 07/23/18 1551    Clinical Impression Statement  Patient demonstrated consistent emerging independence using compensatory strategies to aid word finding. Noted improved independence with category/divergent naming tasks, naming a minimum of 8 items in a named category.   Speech Therapy Frequency  2x / week    Duration  Other (comment)   8 weeks   Treatment/Interventions  Compensatory strategies;SLP instruction and feedback;Patient/family education;Functional tasks    Potential to Achieve Goals  Good    Potential Considerations  Ability to learn/carryover information;Family/community support;Cooperation/participation level    SLP Home Exercise Plan  Word finding exercises, home practice of compensatory strategies for word finding    Consulted and Agree with Plan of Care  Patient       Patient will benefit from skilled therapeutic intervention in order to improve the following deficits and impairments:   Aphasia    Problem List There are no active problems to display for this patient.   [place], MA, CCC-SLP 07/23/2018, 3:52 PM  [place] MAIN [facility] SERVICES [address] [place], [place], [address] Phone: [phone]   Fax:  [phone]   Name: Adriana Hughes MRN: [id number] Date of  Birth: 1941-02-28

## 2018-07-23 NOTE — Therapy (Signed)
[place] [facility] MAIN [facility] SERVICES [address] [place], [place], [address] Phone: [phone]   Fax:  [phone]  Physical Therapy Treatment  Patient Details  Name: Adriana Hughes MRN: [id number] Date of Birth: 1941/05/14 Referring Provider: Clearence Cheek    Encounter Date: 07/23/2018  PT End of Session - 07/23/18 1412    Visit Number  11    Number of Visits  25    Date for PT Re-Evaluation  09/07/18    Authorization Type  11/10    PT Start Time  0205    PT Stop Time  0230    PT Time Calculation (min)  25 min    Equipment Utilized During Treatment  Gait belt    Activity Tolerance  Patient tolerated treatment well;Patient limited by fatigue    Behavior During Therapy  [facility] for tasks assessed/performed       Past Medical History:  Diagnosis Date  . Arthritis   . Fibrocystic breast disease   . GERD (gastroesophageal reflux disease)   . Hyperlipidemia   . Hypertension     Past Surgical History:  Procedure Laterality Date  . APPENDECTOMY    . BREAST BIOPSY    . BREAST EXCISIONAL BIOPSY Bilateral [id number]   benign  . EYE SURGERY    . TEE WITHOUT CARDIOVERSION N/A 04/21/2018   Procedure: TRANSESOPHAGEAL ECHOCARDIOGRAM (TEE);  Surgeon: Lamar Blinks, MD;  Location: [facility] ORS;  Service: Cardiovascular;  Laterality: N/A;    There were no vitals filed for this visit.  Subjective Assessment - 07/23/18 1411    Subjective  Pt reports that she is doing well today. She denies pain currently and has no specific questions or concerns. She denies recent falls or recent changes in her health. HEP is going well.     Patient is accompained by:  Family member    Pertinent History  Per discharge summary from [facility]: Ischemic Stroke s/p alteplase and IA:  Presented initially with symptoms on arrival notable for left gaze deviation, right sided hemiparesis, hemianopia, and right facial droop. Primary stroke risk factors: hypertension. She was found to be  a candidate for tPa that she received on 5/15. She was also a candidate for IA and brought in for successful mechanical thrombectomy with good restoration of blood flow after one pass, TICI 3. Patient monitored in the ICU initially and required intubation due to frequent vomiting and concern for airway protection. She was successfully extubated on 5/16 and transferred to the floor without issues. Risk stratification obtained while inpatient as follows: LDL: 126, A1C: 5.8, TTE reveals EF >55% without evidence of intracardiac thrombus. 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 Post tPA [facility] 5/15 showed interval left basal ganglia hyperdensity, so repeat CTH ordered on 5/16 showing resolution of finding which likely represented contrast staining from IA procedure     How long can you stand comfortably?  no limits    Patient Stated Goals  Patient wants to be able to walk longer distances and not walk towards the right side.     Currently in Pain?  No/denies    Multiple Pain Sites  No      Treatment: Fwd/ bwd walking fast and stopping, turning with fast walking  x5 laps each with mod VCS for sequencing.. Patient had difficulty with turning left quickly and  changing from forward to backward walking;    Forward walking level surface eyes  closed x20 feet x3 laps Backward walking eyes closed  level surface x20 feet x3 laps; path deviation with bwd walking   Step up from foam  for SLS 3 sec hold x5 reps each LE, unsupported; CGA for safety with cues to improve trunk control for better static balance;  Side step up and over 4 inch step with airex pad,  X 10  25 % need of UE for support  Advanced HEP with backward walking;                         PT Education - 07/23/18 1411    Education Details  Saftey, balance    Person(s) Educated  Patient    Methods  Explanation;Demonstration;Verbal cues    Comprehension   Verbalized understanding;Returned demonstration;Need further instruction       PT Short Term Goals - 06/16/18 [id number]      PT SHORT TERM GOAL #1   Title  Patient will be independent in home exercise program to improve strength/mobility for better functional independence with ADLs.    Time  6    Period  Weeks    Status  New    Target Date  07/28/18      PT SHORT TERM GOAL #2   Title  Patient will increase six minute walk test distance to >1000 for progression to community ambulator and improve gait ability    Baseline  585 ft    Time  6    Period  Weeks    Status  New    Target Date  07/28/18        PT Long Term Goals - 07/20/18 1528      PT LONG TERM GOAL #1   Title  Patient will increase 10 meter walk test to >1.1783m/s as to improve gait speed for better community ambulation and to reduce fall risk.    Baseline  07/20/18= .95 m/sec    Time  12    Period  Weeks    Status  On-going    Target Date  09/07/18      PT LONG TERM GOAL #2   Title  Patient will ascend/descend 4 stairs without rail assist independently without loss of balance to improve ability to get in/out of home.     Baseline  , needs rail 07/20/18    Time  12    Period  Weeks    Status  On-going    Target Date  09/07/18      PT LONG TERM GOAL #3   Title   Patient will demonstrate an improved Berg Balance Score of > as to demonstrate improved balance with ADLs such as sitting/standing and transfer balance and reduced fall risk    Baseline  49/56 eval, 52/56 07/20/18    Time  12    Period  Weeks    Status  On-going    Target Date  09/07/18      PT LONG TERM GOAL #4   Title  Patient will increase BLE gross strength to 4+/5 as to improve functional strength for independent gait, increased standing tolerance and increased ADL ability.    Baseline  4/5 BLE 07/20/18    Time  12    Period  Weeks    Status  On-going    Target Date  09/07/18            Plan - 07/23/18 1413    Clinical Impression Statement   Patient  instructed in advanced dynamic and static balance exercise. Utilized stable and uneven surfaces to further challenge dynamic balance. Patient required min Vcs for correct positioning and exercise technique! Patient had a difficult time during narrow base of support challenges. He exhibits better SLS ability being able to progress to foam; Patient would benefit from additional skilled PT Intervention to improve strength, balance and gait safety    Rehab Potential  Good    PT Frequency  2x / week    PT Duration  12 weeks    PT Treatment/Interventions  Manual techniques;Patient/family education;Balance training;Functional mobility training;Therapeutic activities;Therapeutic exercise;Gait training;Aquatic Therapy       Patient will benefit from skilled therapeutic intervention in order to improve the following deficits and impairments:  Abnormal gait, Decreased balance, Decreased endurance, Decreased mobility, Difficulty walking, Decreased activity tolerance, Decreased safety awareness, Decreased strength  Visit Diagnosis: Muscle weakness (generalized)  Difficulty in walking, not elsewhere classified     Problem List There are no active problems to display for this patient.   [address], [place] DPT 07/23/2018, 2:15 PM  [facility] MAIN [facility] SERVICES [address] [place], [place], [address] Phone: [phone]   Fax:  [phone]  Name: DEJANAY WAMBOLDT MRN: [id number] Date of Birth: [birth year]

## 2018-07-27 ENCOUNTER — Encounter: Payer: Self-pay | Admitting: Speech Pathology

## 2018-07-27 ENCOUNTER — Ambulatory Visit: Payer: Medicare Other | Admitting: Physical Therapy

## 2018-07-27 ENCOUNTER — Ambulatory Visit: Payer: Medicare Other | Admitting: Speech Pathology

## 2018-07-27 ENCOUNTER — Encounter: Payer: Self-pay | Admitting: Physical Therapy

## 2018-07-27 DIAGNOSIS — M6281 Muscle weakness (generalized): Secondary | ICD-10-CM

## 2018-07-27 DIAGNOSIS — R262 Difficulty in walking, not elsewhere classified: Secondary | ICD-10-CM

## 2018-07-27 DIAGNOSIS — R4701 Aphasia: Secondary | ICD-10-CM | POA: Diagnosis not present

## 2018-07-27 NOTE — Therapy (Signed)
[facility] [facility] [address] [place], [place], [address] Phone: [phone]   Fax:  [phone]  Physical Therapy Treatment  Patient Details  Name: Adriana Hughes MRN: [id number] Date of Birth: [birth year] Referring Provider: Clearence CheekWILSON, SUSAN    Encounter Date: 07/27/2018  PT End of Session - 07/27/18 1530    Visit Number  22    Number of Visits  25    Date for PT Re-Evaluation  09/07/18    Authorization Type  2/10    PT Start Time  0315    PT Stop Time  0355    PT Time Calculation (min)  40 min    Equipment Utilized During Treatment  Gait belt    Activity Tolerance  Patient tolerated treatment well;Patient limited by fatigue    Behavior During Therapy  [facility] for tasks assessed/performed       Past Medical History:  Diagnosis Date  . Arthritis   . Fibrocystic breast disease   . GERD (gastroesophageal reflux disease)   . Hyperlipidemia   . Hypertension     Past Surgical History:  Procedure Laterality Date  . APPENDECTOMY    . BREAST BIOPSY    . BREAST EXCISIONAL BIOPSY Bilateral [id number]   benign  . EYE SURGERY    . TEE WITHOUT CARDIOVERSION N/A 04/21/2018   Procedure: TRANSESOPHAGEAL ECHOCARDIOGRAM (TEE);  Surgeon: Lamar BlinksKowalski, Bruce J, MD;  Location: [facility] ORS;  Service: Cardiovascular;  Laterality: N/A;    There were no vitals filed for this visit.  Subjective Assessment - 07/27/18 1529    Subjective  Pt reports that she is doing well today. She denies pain currently and has no specific questions or concerns. She denies recent falls or recent changes in her health. HEP is going well.     Patient is accompained by:  Family member    Pertinent History  [facility]: Ischemic Stroke s/p alteplase and IA:  Presented initially with symptoms on arrival notable for left gaze deviation, right sided hemiparesis, hemianopia, and right facial droop. Primary stroke risk factors: hypertension. She was found to be  a candidate for tPa that she received on 5/15. She was also a candidate for IA and brought in for successful mechanical thrombectomy with good restoration of blood flow after one pass, TICI 3. Patient monitored in the ICU initially and required intubation due to frequent vomiting and concern for airway protection. She was successfully extubated on 5/16 and transferred to the floor without issues. Risk stratification obtained while inpatient as follows: LDL: 126, A1C: 5.8, TTE reveals EF >55% without evidence of intracardiac thrombus. 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 [facility] showed interval left basal ganglia hyperdensity, so repeat CTH ordered on 5/16 showing resolution of finding which likely represented contrast staining from IA procedure     How long can you stand comfortably?  no limits    Patient Stated Goals  Patient wants to be able to walk longer distances and not walk towards the right side.     Currently in Pain?  No/denies    Multiple Pain Sites  No       TREATMENT: Warm up onoctane fitnesslevel 4 x5  min (Unbilled):  Instructed patient in advanced balance exercise: Standing modified tandem on airex:Trunk rotation side to side with ball BUE hold and head turning with trunk x 10  1/2 foam flat side  down, Tapping balloon to mirror  x 2 mins  feet apart Alternate toe taps on 4 inch step without rail assist x15 with cues to step backwards for better foot placement and avoid toe catching; Modified tandem stance with BUE ball up/down x10 each foot in front with CGA for safety Standing on 1/2 bolster (flat side up):Feet flat, apart, BUE wand flexion x10 reps with min A For safety; Patient able to keep balance well with minimal posterior loss of balance; Agility ladder fwd stepping, side stepping x 15 feet  Star stepping single leg with single leg stand x 5 reps  stepping over hurdle x 10 fwd / bwd, 10  side to side with UE support Leg press 100 lbs x 20 x 2   Standing on airex beam:  Tandem stance on airex beam without rail assist 10 sec hold x4 each foot in front with CGA for safety and cues to improve upper trunk control for better balance control; Side stepping down airex beam without rail assist x3 laps each direction with cues to keep feet on beam and avoid stepping off; Standing with feet apart, BUE ball toss x10 unsupportedwith close supervision; Patient exhibits posterior loss of balance requiring cues for forward weight shift;   CGA and Min to mod verbal cues used throughout with increased in postural sway and LOB most seen with narrow base of support and while on uneven surfaces. Continues to have balance deficits typical with diagnosis.                       PT Education - 07/27/18 1530    Education Details  Saftey, balance    Person(s) Educated  Patient;Child(ren)    Methods  Explanation;Demonstration;Verbal cues    Comprehension  Verbalized understanding;Returned demonstration       PT Short Term Goals - 06/16/18 1610      PT SHORT TERM GOAL #1   Title  Patient will be independent in home exercise program to improve strength/mobility for better functional independence with ADLs.    Time  6    Period  Weeks    Status  New    Target Date  07/28/18      PT SHORT TERM GOAL #2   Title  Patient will increase six minute walk test distance to >1000 for progression to community ambulator and improve gait ability    Baseline  585 ft    Time  6    Period  Weeks    Status  New    Target Date  07/28/18        PT Long Term Goals - 07/20/18 1528      PT LONG TERM GOAL #1   Title  Patient will increase 10 meter walk test to >1.2m/s as to improve gait speed for better community ambulation and to reduce fall risk.    Baseline  07/20/18= .95 m/sec    Time  12    Period  Weeks    Status  On-going    Target Date  09/07/18      PT LONG TERM GOAL #2    Title  Patient will ascend/descend 4 stairs without rail assist independently without loss of balance to improve ability to get in/out of home.     Baseline  , needs rail 07/20/18    Time  12    Period  Weeks    Status  On-going    Target Date  09/07/18  PT LONG TERM GOAL #3   Title   Patient will demonstrate an improved Berg Balance Score of > as to demonstrate improved balance with ADLs such as sitting/standing and transfer balance and reduced fall risk    Baseline  49/56 eval, 52/56 07/20/18    Time  12    Period  Weeks    Status  On-going    Target Date  09/07/18      PT LONG TERM GOAL #4   Title  Patient will increase BLE gross strength to 4+/5 as to improve functional strength for independent gait, increased standing tolerance and increased ADL ability.    Baseline  4/5 BLE 07/20/18    Time  12    Period  Weeks    Status  On-going    Target Date  09/07/18            Plan - 07/27/18 1531    Clinical Impression Statement  Patient demonstrated improved coordination with increased repetition and practice. Initiation of new task requires verbal, visual, and demonstrative cueing to be performed with tactile correction. Decreased correction and cueing was needed as task was repeated. Patient able to perform open and closed chain exercises. Patient would benefit from additional skilled PT intervention to improve coordination and balance.    Rehab Potential  Good    PT Frequency  2x / week    PT Duration  12 weeks    PT Treatment/Interventions  Manual techniques;Patient/family education;Balance training;Functional mobility training;Therapeutic activities;Therapeutic exercise;Gait training;Aquatic Therapy       Patient will benefit from skilled therapeutic intervention in order to improve the following deficits and impairments:  Abnormal gait, Decreased balance, Decreased endurance, Decreased mobility, Difficulty walking, Decreased activity tolerance, Decreased safety awareness,  Decreased strength  Visit Diagnosis: Muscle weakness (generalized)  Difficulty in walking, not elsewhere classified  Aphasia     Problem List There are no active problems to display for this patient.   [address], [place] DPT 07/27/2018, 4:09 PM  [facility] MAIN [facility] SERVICES [address] [place], [place], [address] Phone: [phone]   Fax:  [phone]  Name: Adriana Hughes MRN: [id number] Date of Birth: 1941/06/24

## 2018-07-27 NOTE — Therapy (Signed)
[place] MAIN [facility] SERVICES [address] [place], [place], [address] Phone: [phone]   Fax:  [phone]  Speech Language Pathology Treatment  Patient Details  Name: Adriana Hughes MRN: [id number] Date of Birth: 21-May-1941 Referring Provider: [name], NP   Encounter Date: 07/27/2018  End of Session - 07/27/18 1707    Visit Number  12    Number of Visits  17    Date for SLP Re-Evaluation  07/30/18    SLP Start Time  1400    SLP Stop Time   1454    SLP Time Calculation (min)  54 min    Activity Tolerance  Patient tolerated treatment well       Past Medical History:  Diagnosis Date  . Arthritis   . Fibrocystic breast disease   . GERD (gastroesophageal reflux disease)   . Hyperlipidemia   . Hypertension     Past Surgical History:  Procedure Laterality Date  . APPENDECTOMY    . BREAST BIOPSY    . BREAST EXCISIONAL BIOPSY Bilateral [id number]   benign  . EYE SURGERY    . TEE WITHOUT CARDIOVERSION N/A 04/21/2018   Procedure: TRANSESOPHAGEAL ECHOCARDIOGRAM (TEE);  Surgeon: Corey Skains, MD;  Location: [facility] ORS;  Service: Cardiovascular;  Laterality: N/A;    There were no vitals filed for this visit.  Subjective Assessment - 07/27/18 1704    Subjective  Patient reported she wants help fixing her writing, both improving the size of her lettering and composing sentences.    Currently in Pain?  No/denies            ADULT SLP TREATMENT - 07/27/18 0001      General Information   Behavior/Cognition  Alert;Cooperative;Pleasant mood    Patient Positioning  Upright in chair    HPI  Per discharge summary from [facility]: Ischemic Stroke s/p alteplase and IA:  Presented initially with symptoms on arrival notable for left gaze deviation, right sided hemiparesis, hemianopia, and right facial droop. Primary stroke risk factors: hypertension. She was found to be a candidate for tPa that she received on 5/15. She was also a candidate  for IA and brought in for successful mechanical thrombectomy with good restoration of blood flow after one pass, TICI 3. Patient monitored in the ICU initially and required intubation due to frequent vomiting and concern for airway protection. She was successfully extubated on 5/16 and transferred to the floor without issues.CT Head revealed left MCA infarct without hemorrhage. 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       Treatment Provided   Treatment provided  Cognitive-Linquistic      Pain Assessment   Pain Assessment  No/denies pain      Cognitive-Linquistic Treatment   Treatment focused on  Aphasia    Skilled Treatment Patient named 4-5 semantic features given pictured objects with 100% accuracy given min verbal cues.  Patient composed a novel sentence given a target word with 70% accuracy given min to mod verbal cues.   Patient copied short phrase on lined paper to increase size and legibility of writing given cues to connect letters to bottom line.  Patient wrote answers to complete analogies with an appropriate word given min verbal cues with 100% accuracy and with 80% accuracy independently.  Patient named 15 members in a given category with 100% accuracy given min verbal cues (named 4-8 independently depending on the category named).     Assessment / Recommendations /  Plan   Plan  Continue with current plan of care      Progression Toward Goals   Progression toward goals  Progressing toward goals       SLP Education - 07/27/18 1705    Education Details  compensatory strategies for word finding to aid verbal and written communication and to increase the size and accuracy of her writing    Person(s) Educated  Patient    Methods  Explanation;Demonstration;Verbal cues    Comprehension  Verbalized understanding;Need further instruction;Returned demonstration;Verbal cues required         SLP Long Term Goals - 07/02/18 1802      SLP LONG TERM GOAL #1    Title  Patient will describe pictured objects by naming 3 semantic features with 80% accuracy given min verbal cues.    Time  8    Period  Weeks    Status  Partially Met      SLP LONG TERM GOAL #2   Title  Patient will name 10 members in a given category with 80% accuracy given min verbal cues.    Time  8    Period  Weeks    Status  On-going      SLP LONG TERM GOAL #3   Title  Patient will compose a novel sentence when given 1-2 target words with 80% accuracy independently.    Time  8    Period  Weeks    Status  On-going      SLP LONG TERM GOAL #4   Title  Patient will give 3 semantic descriptors when having word finding difficulty in conversation with 100% accuracy given min verbal cues.    Time  8    Period  Weeks    Status  On-going      SLP LONG TERM GOAL #5   Title  Patient will be independent with resonant voice exercises.    Time  8    Period  Weeks    Status  Deferred       Plan - 07/27/18 1707    Clinical Impression Statement  Patient demonstrated continued improved divergent naming given min cues; however required mildly increased cueing to use semantic feature analysis to aid word finding in comparison to last session.    Speech Therapy Frequency  2x / week    Duration  Other (comment)   8 weeks   Treatment/Interventions  Compensatory strategies;SLP instruction and feedback;Patient/family education;Functional tasks    Potential to Achieve Goals  Good    Potential Considerations  Ability to learn/carryover information;Family/community support;Cooperation/participation level    SLP Home Exercise Plan  Word finding exercises, home practice of compensatory strategies for word finding, copying a list of words on lined paper to guide size of lettering    Consulted and Agree with Plan of Care  Patient       Patient will benefit from skilled therapeutic intervention in order to improve the following deficits and impairments:   Aphasia    Problem List There are no  active problems to display for this patient.   [place], MA, CCC-SLP 07/27/2018, 5:09 PM  [place] MAIN [facility] SERVICES [address] [place], [place], [address] Phone: [phone]   Fax:  [phone]   Name: KIKI BIVENS MRN: [id number] Date of Birth: 06/27/1941

## 2018-07-30 ENCOUNTER — Encounter: Payer: Self-pay | Admitting: Physical Therapy

## 2018-07-30 ENCOUNTER — Encounter: Payer: Self-pay | Admitting: Speech Pathology

## 2018-07-30 ENCOUNTER — Ambulatory Visit: Payer: Medicare Other | Admitting: Physical Therapy

## 2018-07-30 ENCOUNTER — Ambulatory Visit: Payer: Medicare Other | Admitting: Speech Pathology

## 2018-07-30 DIAGNOSIS — R4701 Aphasia: Secondary | ICD-10-CM | POA: Diagnosis not present

## 2018-07-30 DIAGNOSIS — R262 Difficulty in walking, not elsewhere classified: Secondary | ICD-10-CM

## 2018-07-30 DIAGNOSIS — M6281 Muscle weakness (generalized): Secondary | ICD-10-CM

## 2018-07-30 NOTE — Therapy (Signed)
[place] MAIN [facility] SERVICES [address] [place], [place], [address] Phone: [phone]   Fax:  [phone]  Speech Language Pathology Treatment  Patient Details  Name: Adriana Hughes MRN: [id number] Date of Birth: 1941/10/17 Referring Provider (SLP): [name], NP   Encounter Date: 07/30/2018  End of Session - 07/30/18 1404    Visit Number  13    Number of Visits  17    Date for SLP Re-Evaluation  07/30/18    Authorization Type  2/10 medicare progress note    SLP Start Time  1300    SLP Stop Time   1353    SLP Time Calculation (min)  53 min    Activity Tolerance  Patient tolerated treatment well       Past Medical History:  Diagnosis Date  . Arthritis   . Fibrocystic breast disease   . GERD (gastroesophageal reflux disease)   . Hyperlipidemia   . Hypertension     Past Surgical History:  Procedure Laterality Date  . APPENDECTOMY    . BREAST BIOPSY    . BREAST EXCISIONAL BIOPSY Bilateral [id number]   benign  . EYE SURGERY    . TEE WITHOUT CARDIOVERSION N/A 04/21/2018   Procedure: TRANSESOPHAGEAL ECHOCARDIOGRAM (TEE);  Surgeon: Corey Skains, MD;  Location: [facility] ORS;  Service: Cardiovascular;  Laterality: N/A;    There were no vitals filed for this visit.  Subjective Assessment - 07/30/18 1402    Subjective  Patient reported she is doing well, but forgot to do one of her homework pages.    Currently in Pain?  No/denies            ADULT SLP TREATMENT - 07/30/18 0001      General Information   Behavior/Cognition  Alert;Cooperative;Pleasant mood    Patient Positioning  Upright in chair    HPI  Per discharge summary from [facility]: Ischemic Stroke s/p alteplase and IA:  Presented initially with symptoms on arrival notable for left gaze deviation, right sided hemiparesis, hemianopia, and right facial droop. Primary stroke risk factors: hypertension. She was found to be a candidate for tPa that she received on 5/15. She  was also a candidate for IA and brought in for successful mechanical thrombectomy with good restoration of blood flow after one pass, TICI 3. Patient monitored in the ICU initially and required intubation due to frequent vomiting and concern for airway protection. She was successfully extubated on 5/16 and transferred to the floor without issues.  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       Treatment Provided   Treatment provided  Cognitive-Linquistic      Pain Assessment   Pain Assessment  No/denies pain      Cognitive-Linquistic Treatment   Treatment focused on  Aphasia    Skilled Treatment Patient named 4-5 semantic features given pictured objects with 100% accuracy given occasional min verbal cues and named 4-5 semantic features given a target word with 100% accuracy given visual cues via semantic feature analysis chart and min to mod verbal cues (60% independently.)  Patient composed a novel sentence given a picture describing semantic features with 80% accuracy given min to mod verbal cues.  Patient wrote answers to complete analogies with an appropriate word given min verbal cues with 100% accuracy and with 70% accuracy independently.     Assessment / Recommendations / Plan   Plan  Continue with  current plan of care      Progression Toward Goals   Progression toward goals  Progressing toward goals       SLP Education - 07/30/18 1403    Education Details  Compensatory strategies for word finding to aid verbal and written communication and visual aids to increase the size and accuracy of her writing    Person(s) Educated  Patient    Methods  Explanation;Demonstration;Handout;Verbal cues    Comprehension  Verbalized understanding;Need further instruction;Returned demonstration;Verbal cues required         SLP Long Term Goals - 07/30/18 1408      SLP LONG TERM GOAL #1   Title  Patient will describe  pictured objects by naming 3-5 semantic features independently.    Baseline  Previous goal met: Patient will describe pictured objects by naming 3 semantic features with 80% accuracy given min verbal cues. (met 07/30/18)    Time  8    Period  Weeks    Status  Revised      SLP LONG TERM GOAL #2   Title  Patient will name 10 members in a given category with 80% accuracy independently.    Baseline  Previous goal met: Patient will name 10 members in a given category with 80% accuracy given min verbal cues. ( met 07/30/18)    Time  16    Period  Weeks    Status  Revised      SLP LONG TERM GOAL #3   Title  Patient will compose a novel sentence when given 1-2 target words or when given a picture with 80% accuracy independently.    Baseline  Previous goal partially met: Patient will compose a novel sentence when given 1-2 target words or when given a picture with 80% accuracy independently. (revised 07/30/18)    Time  16    Period  Weeks    Status  Revised      SLP LONG TERM GOAL #4   Title  Patient will name semantic features/descriptors when having word finding difficulty in conversation with 80% accuracy independently.    Baseline  Previous goal: Patient will name semantic features/descriptors when having word finding difficulty in conversation with 100% accuracy given min verbal cues., revised 07/18/77 for recert    Time  16    Period  Weeks    Status  Revised      SLP LONG TERM GOAL #5   Title  Patient will be independent with resonant voice exercises.    Time  8    Period  Weeks    Status  Deferred       Plan - 07/30/18 1406    Clinical Impression Statement  Patient continues to make steady progress on treatment goals, meeting 2/4 goals today. Revised goals to focus on developing independence and accuracy using compensatory strategies to aid verbal and written communication. Will submit recertification to continue ST treatment for 8 weeks to continue to provide training and support to  return to prior level of verbal and written expressive language function.    Speech Therapy Frequency  2x / week    Duration  --   16 weeks   Treatment/Interventions  Compensatory strategies;SLP instruction and feedback;Patient/family education;Functional tasks    Potential to Achieve Goals  Good    Potential Considerations  Ability to learn/carryover information;Family/community support;Cooperation/participation level    SLP Home Exercise Plan  Word finding exercises, home practice of compensatory strategies for word finding, copying a list of words  on lined paper to guide size of lettering    Consulted and Agree with Plan of Care  Patient       Patient will benefit from skilled therapeutic intervention in order to improve the following deficits and impairments:   Aphasia    Problem List There are no active problems to display for this patient.   [place], MA, CCC-SLP 07/30/2018, 2:28 PM  [place] MAIN [facility] SERVICES [address] [place], [place], [address] Phone: [phone]   Fax:  [phone]   Name: AMERIE BEAUMONT MRN: [id number] Date of Birth: 1941-08-25

## 2018-07-30 NOTE — Therapy (Signed)
[facility] MAIN [facility] SERVICES [address] [place], [place], [address] Phone: [phone]   Fax:  [phone]  Physical Therapy Treatment  Patient Details  Name: Adriana Hughes MRN: [id number] Date of Birth: 1941-02-04 Referring Provider (PT): Clearence Cheek    Encounter Date: 07/30/2018  PT End of Session - 07/30/18 1404    Visit Number  23    Number of Visits  25    Date for PT Re-Evaluation  09/07/18    Authorization Type  3/10    PT Start Time  0200    PT Stop Time  0240    PT Time Calculation (min)  40 min    Equipment Utilized During Treatment  Gait belt    Activity Tolerance  Patient tolerated treatment well;Patient limited by fatigue    Behavior During Therapy  [facility] for tasks assessed/performed       Past Medical History:  Diagnosis Date  . Arthritis   . Fibrocystic breast disease   . GERD (gastroesophageal reflux disease)   . Hyperlipidemia   . Hypertension     Past Surgical History:  Procedure Laterality Date  . APPENDECTOMY    . BREAST BIOPSY    . BREAST EXCISIONAL BIOPSY Bilateral [id number]   benign  . EYE SURGERY    . TEE WITHOUT CARDIOVERSION N/A 04/21/2018   Procedure: TRANSESOPHAGEAL ECHOCARDIOGRAM (TEE);  Surgeon: Lamar Blinks, MD;  Location: [facility] ORS;  Service: Cardiovascular;  Laterality: N/A;    There were no vitals filed for this visit.  Subjective Assessment - 07/30/18 1402    Subjective  Pt reports that she is doing well today. She denies pain currently and has no specific questions or concerns. She denies recent falls or recent changes in her health. HEP is going well.     Patient is accompained by:  Family member    Pertinent History  Per discharge summary from [facility]: Ischemic Stroke s/p alteplase and IA:  Presented initially with symptoms on arrival notable for left gaze deviation, right sided hemiparesis, hemianopia, and right facial droop. Primary stroke risk factors: hypertension. She was found  to be a candidate for tPa that she received on 5/15. She was also a candidate for IA and brought in for successful mechanical thrombectomy with good restoration of blood flow after one pass, TICI 3. Patient monitored in the ICU initially and required intubation due to frequent vomiting and concern for airway protection. She was successfully extubated on 5/16 and transferred to the floor without issues. Risk stratification obtained while inpatient as follows: LDL: 126, A1C: 5.8, TTE reveals EF >55% without evidence of intracardiac thrombus. 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 Post tPA [facility] 5/15 showed interval left basal ganglia hyperdensity, so repeat CTH ordered on 5/16 showing resolution of finding which likely represented contrast staining from IA procedure     How long can you stand comfortably?  no limits    Patient Stated Goals  Patient wants to be able to walk longer distances and not walk towards the right side.     Currently in Pain?  No/denies    Multiple Pain Sites  No       Treatment:  Fwd/ bwd walking fast and stopping, turning with fast walking  x5 laps each with mod VCS for sequencing.. Patient had difficulty with turning left quickly and  changing from forward to backward walking;  Forward walking level surface  eyes closed x20 feet x3laps  Backward walking eyes closed  level surface x20 feet x3 laps;path deviation with bwd walking  Step up from foam  for SLS 3 sec hold x5 reps each LE, unsupported; CGA for safety with cues to improve trunk control for better static balance;  Side step up and over 4 inch step with airex pad,  X 10  25 % need of UE for support  Bosu ball lunges x 20 x 2   Advanced HEP with backward walking;   Leg press 90 lbs x 20 x 2; heel raises 60 lbs x 20 x 2    CGA and Min  verbal cues used throughout with increased in postural sway and LOB most seen with narrow base of  support and while on uneven surfaces. Continues to have balance deficits typical with diagnosis. Patient performs intermediate level exercises without pain behaviors and needs verbal cuing for postural alignment and head positioning                      PT Education - 07/30/18 1403    Education Details  HEP    Person(s) Educated  Patient;Spouse    Methods  Explanation;Demonstration    Comprehension  Verbalized understanding;Returned demonstration       PT Short Term Goals - 06/16/18 0808      PT SHORT TERM GOAL #1   Title  Patient will be independent in home exercise program to improve strength/mobility for better functional independence with ADLs.    Time  6    Period  Weeks    Status  New    Target Date  07/28/18      PT SHORT TERM GOAL #2   Title  Patient will increase six minute walk test distance to >1000 for progression to community ambulator and improve gait ability    Baseline  585 ft    Time  6    Period  Weeks    Status  New    Target Date  07/28/18        PT Long Term Goals - 07/20/18 1528      PT LONG TERM GOAL #1   Title  Patient will increase 10 meter walk test to >1.36m/s as to improve gait speed for better community ambulation and to reduce fall risk.    Baseline  07/20/18= .95 m/sec    Time  12    Period  Weeks    Status  On-going    Target Date  09/07/18      PT LONG TERM GOAL #2   Title  Patient will ascend/descend 4 stairs without rail assist independently without loss of balance to improve ability to get in/out of home.     Baseline  , needs rail 07/20/18    Time  12    Period  Weeks    Status  On-going    Target Date  09/07/18      PT LONG TERM GOAL #3   Title   Patient will demonstrate an improved Berg Balance Score of > as to demonstrate improved balance with ADLs such as sitting/standing and transfer balance and reduced fall risk    Baseline  49/56 eval, 52/56 07/20/18    Time  12    Period  Weeks    Status  On-going     Target Date  09/07/18      PT LONG TERM GOAL #4   Title  Patient will increase BLE  gross strength to 4+/5 as to improve functional strength for independent gait, increased standing tolerance and increased ADL ability.    Baseline  4/5 BLE 07/20/18    Time  12    Period  Weeks    Status  On-going    Target Date  09/07/18            Plan - 07/30/18 1406    Clinical Impression Statement   Pt was able to perform all exercises today with CGA.Marland Kitchen Pt was able to perform all balance and strength exercises, demonstrating improvements in LE strength and stability.  Pt was able to complete dynamic balance exercises, showing ability to stand on even surfaces with min assist and improve postural reactions to correct self during activities.  Pt requires verbal, visual and tactile cues during exercise in order to complete tasks with proper form and technique, as well as to stay on task.  Pt would continue to benefit from skilled PT services in order to further strengthen LE's, improve static and dynamic balance, and improve coordination in order to increase functional mobility and decrease risk of falls    Rehab Potential  Good    PT Frequency  2x / week    PT Duration  12 weeks    PT Treatment/Interventions  Manual techniques;Patient/family education;Balance training;Functional mobility training;Therapeutic activities;Therapeutic exercise;Gait training;Aquatic Therapy       Patient will benefit from skilled therapeutic intervention in order to improve the following deficits and impairments:  Abnormal gait, Decreased balance, Decreased endurance, Decreased mobility, Difficulty walking, Decreased activity tolerance, Decreased safety awareness, Decreased strength  Visit Diagnosis: Muscle weakness (generalized)  Difficulty in walking, not elsewhere classified     Problem List There are no active problems to display for this patient.   [address] [place] DPT 07/30/2018, 2:48 PM  [facility] MAIN [facility] SERVICES [address] [place], [place], [address] Phone: [phone]   Fax:  [phone]  Name: Adriana Hughes MRN: [id number] Date of Birth: 1941-03-22

## 2018-08-03 ENCOUNTER — Ambulatory Visit: Payer: Medicare Other | Admitting: Physical Therapy

## 2018-08-03 ENCOUNTER — Encounter: Payer: Self-pay | Admitting: Speech Pathology

## 2018-08-03 ENCOUNTER — Ambulatory Visit: Payer: Medicare Other | Admitting: Speech Pathology

## 2018-08-03 ENCOUNTER — Encounter: Payer: Self-pay | Admitting: Physical Therapy

## 2018-08-03 DIAGNOSIS — R4701 Aphasia: Secondary | ICD-10-CM | POA: Diagnosis not present

## 2018-08-03 DIAGNOSIS — M6281 Muscle weakness (generalized): Secondary | ICD-10-CM

## 2018-08-03 DIAGNOSIS — R262 Difficulty in walking, not elsewhere classified: Secondary | ICD-10-CM

## 2018-08-03 NOTE — Therapy (Signed)
[place] MAIN [facility] SERVICES [address] [place], [place], [address] Phone: [phone]   Fax:  [phone]  Speech Language Pathology Treatment  Patient Details  Name: Adriana Hughes MRN: [id number] Date of Birth: Oct 16, 1941 Referring Provider (SLP): [name], NP   Encounter Date: 08/03/2018  End of Session - 08/03/18 1455    Visit Number  14    Number of Visits  33    Date for SLP Re-Evaluation  09/25/18    Authorization Type  3/10 medicare progress note    SLP Start Time  1300    SLP Stop Time   1350    SLP Time Calculation (min)  50 min    Activity Tolerance  Patient tolerated treatment well       Past Medical History:  Diagnosis Date  . Arthritis   . Fibrocystic breast disease   . GERD (gastroesophageal reflux disease)   . Hyperlipidemia   . Hypertension     Past Surgical History:  Procedure Laterality Date  . APPENDECTOMY    . BREAST BIOPSY    . BREAST EXCISIONAL BIOPSY Bilateral 6962,9528   benign  . EYE SURGERY    . TEE WITHOUT CARDIOVERSION N/A 04/21/2018   Procedure: TRANSESOPHAGEAL ECHOCARDIOGRAM (TEE);  Surgeon: Corey Skains, MD;  Location: [facility] ORS;  Service: Cardiovascular;  Laterality: N/A;    There were no vitals filed for this visit.  Subjective Assessment - 08/03/18 1454    Subjective  Patient reported she is doing well and said her homework wasn't too hard this time.    Currently in Pain?  No/denies            ADULT SLP TREATMENT - 08/03/18 0001      General Information   Behavior/Cognition  Alert;Cooperative;Pleasant mood    Patient Positioning  Upright in chair    HPI  Per discharge summary from [facility]: Ischemic Stroke s/p alteplase and IA:  Presented initially with symptoms on arrival notable for left gaze deviation, right sided hemiparesis, hemianopia, and right facial droop. Primary stroke risk factors: hypertension. She was found to be a candidate for tPa that she received on 5/15.  She was also a candidate for IA and brought in for successful mechanical thrombectomy with good restoration of blood flow after one pass, TICI 3. Patient monitored in the ICU initially and required intubation due to frequent vomiting and concern for airway protection. She was successfully extubated on 5/16 and transferred to the floor without issues.CT Head revealed left MCA infarct without hemorrhage. 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       Treatment Provided   Treatment provided  Cognitive-Linquistic      Pain Assessment   Pain Assessment  No/denies pain      Cognitive-Linquistic Treatment   Treatment focused on  Aphasia    Skilled Treatment Patient composed a novel sentence to describe the function of a given target word with 90% accuracy given min verbal cues, with 60% accuracy independently.  Patient corrected absurd sentences by replacing erroneous semantic choice with an appropriate word choice with 100% accuracy given min verbal cues, 80% accuracy independently.  Patient described differences and similarities of 2 given words with 100% accuracy given min to mod verbal cues, 70% accuracy independently.     Assessment / Recommendations / Plan   Plan  Continue with current plan of care      Progression Toward Goals   Progression toward goals  Progressing toward goals       SLP Education - 08/03/18 1454    Education Details  Compensatory strategies for word finding to aid verbal and written communication, visual aids to increase the size & accuracy of her writing    Person(s) Educated  Patient    Methods  Explanation;Demonstration;Verbal cues;Handout    Comprehension  Verbalized understanding;Need further instruction;Returned demonstration;Verbal cues required         SLP Long Term Goals - 07/30/18 1408      SLP LONG TERM GOAL #1   Title  Patient will describe pictured objects by naming 3-5 semantic features independently.    Baseline  Previous goal  met: Patient will describe pictured objects by naming 3 semantic features with 80% accuracy given min verbal cues. (met 07/30/18)    Time  8    Period  Weeks    Status  Revised      SLP LONG TERM GOAL #2   Title  Patient will name 10 members in a given category with 80% accuracy independently.    Baseline  Previous goal met: Patient will name 10 members in a given category with 80% accuracy given min verbal cues. ( met 07/30/18)    Time  16    Period  Weeks    Status  Revised      SLP LONG TERM GOAL #3   Title  Patient will compose a novel sentence when given 1-2 target words or when given a picture with 80% accuracy independently.    Baseline  Previous goal partially met: Patient will compose a novel sentence when given 1-2 target words or when given a picture with 80% accuracy independently. (revised 07/30/18)    Time  16    Period  Weeks    Status  Revised      SLP LONG TERM GOAL #4   Title  Patient will name semantic features/descriptors when having word finding difficulty in conversation with 80% accuracy independently.    Baseline  Previous goal: Patient will name semantic features/descriptors when having word finding difficulty in conversation with 100% accuracy given min verbal cues., revised 1/44/81 for recert    Time  16    Period  Weeks    Status  Revised      SLP LONG TERM GOAL #5   Title  Patient will be independent with resonant voice exercises.    Time  8    Period  Weeks    Status  Deferred       Plan - 08/03/18 1456    Clinical Impression Statement  Patient demonstrated improved mental flexibility in naming similarities and differences between 2 named items given min verbal cues. Patient requires semi-regular cues to use semantic feature analysis chart to assist word finding in treatment activities and in conversation.   Speech Therapy Frequency  2x / week    Duration  Other (comment)   8 weeks   Treatment/Interventions  Compensatory strategies;SLP instruction and  feedback;Patient/family education;Functional tasks    Potential to Achieve Goals  Good    Potential Considerations  Ability to learn/carryover information;Family/community support;Cooperation/participation level    SLP Home Exercise Plan  Word finding exercises, home practice of compensatory strategies for word finding, copying a list of words on lined paper to guide size of lettering    Consulted and Agree with Plan of Care  Patient       Patient will benefit from skilled therapeutic intervention in order to improve the following deficits and  impairments:   Aphasia    Problem List There are no active problems to display for this patient.   [place], MA, CCC-SLP 08/03/2018, 4:15 PM  [place] MAIN [facility] SERVICES [address] [place], [place], [address] Phone: [phone]   Fax:  [phone]   Name: Adriana Hughes MRN: [id number] Date of Birth: [birth year]

## 2018-08-03 NOTE — Therapy (Signed)
[place] [facility] MAIN [facility] SERVICES [address] [place], [place], [address] Phone: [phone]   Fax:  [phone]  Physical Therapy Treatment  Patient Details  Name: Adriana Hughes MRN: [id number] Date of Birth: July 30, 1941 Referring Provider (PT): Clearence Cheek    Encounter Date: 08/03/2018  PT End of Session - 08/03/18 1509    Visit Number  24    Number of Visits  25    Date for PT Re-Evaluation  09/07/18    Authorization Type  4/10    PT Start Time  0300    PT Stop Time  0345    PT Time Calculation (min)  45 min    Equipment Utilized During Treatment  Gait belt    Activity Tolerance  Patient tolerated treatment well;Patient limited by fatigue    Behavior During Therapy  [facility] for tasks assessed/performed       Past Medical History:  Diagnosis Date  . Arthritis   . Fibrocystic breast disease   . GERD (gastroesophageal reflux disease)   . Hyperlipidemia   . Hypertension     Past Surgical History:  Procedure Laterality Date  . APPENDECTOMY    . BREAST BIOPSY    . BREAST EXCISIONAL BIOPSY Bilateral [id number]   benign  . EYE SURGERY    . TEE WITHOUT CARDIOVERSION N/A 04/21/2018   Procedure: TRANSESOPHAGEAL ECHOCARDIOGRAM (TEE);  Surgeon: Lamar Blinks, MD;  Location: [facility] ORS;  Service: Cardiovascular;  Laterality: N/A;    There were no vitals filed for this visit.  Subjective Assessment - 08/03/18 1508    Subjective  Pt reports that she is doing well today. She denies pain currently and has no specific questions or concerns. She denies recent falls or recent changes in her health. HEP is going well.     Patient is accompained by:  Family member    Pertinent History  Per discharge summary from [facility]: Ischemic Stroke s/p alteplase and IA:  Presented initially with symptoms on arrival notable for left gaze deviation, right sided hemiparesis, hemianopia, and right facial droop. Primary stroke risk factors: hypertension. She was found  to be a candidate for tPa that she received on 5/15. She was also a candidate for IA and brought in for successful mechanical thrombectomy with good restoration of blood flow after one pass, TICI 3. Patient monitored in the ICU initially and required intubation due to frequent vomiting and concern for airway protection. She was successfully extubated on 5/16 and transferred to the floor without issues. Risk stratification obtained while inpatient as follows: LDL: 126, A1C: 5.8, TTE reveals EF >55% without evidence of intracardiac thrombus. 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 Post tPA [facility] 5/15 showed interval left basal ganglia hyperdensity, so repeat CTH ordered on 5/16 showing resolution of finding which likely represented contrast staining from IA procedure     How long can you stand comfortably?  no limits    Patient Stated Goals  Patient wants to be able to walk longer distances and not walk towards the right side.     Currently in Pain?  No/denies    Multiple Pain Sites  No        Neuromuscular Re-education  Airex balance with toe taps to 6" step alternating LE x 10 each, faded UE support , continues to need UE support 75%  Static balance on 1/2 bolster (flat side up) with no UE support 30  s x 2   Rockerboard: Lateral weight shift x10 reps each direction, no UE support, CGA for safety with VCs to control the board tap in each direction and not letting it hit too hard   Feet apart on BOSU ball and head turns  x10 reps,  UE support, CGA for safety with VCs to utilize core  Airex pad  Head turns  x2 min, CGA for safety, demonstrated difficulty with catching the ball on its return and required VCs to control the speed of toss to control the return speed  Airex pad, balloon passes x2 min, supervision for safety with varying directions and speed of balloon, VCs for utilizing both hands and minimizing UE  support Agility ladder with Min assist  BOSU ball lunges with single UE support x 15 BLE , cues and modeling / demonstration for instructions    TM walking bwd with min assist x 5 mins . 4 miles / hour  Matrix side stepping and bwd walking x 5 mins   CGA and Min to mod verbal cues used throughout with increased in postural sway and LOB most seen with narrow base of support and while on uneven surfaces. Continues to have balance deficits typical with diagnosis. Patient performs intermediate level exercises without pain behaviors and needs verbal cuing for postural alignment and head positioning                         PT Education - 08/03/18 1508    Education Details  HEP    Person(s) Educated  Patient    Methods  Explanation    Comprehension  Verbalized understanding;Returned demonstration       PT Short Term Goals - 06/16/18 0808      PT SHORT TERM GOAL #1   Title  Patient will be independent in home exercise program to improve strength/mobility for better functional independence with ADLs.    Time  6    Period  Weeks    Status  New    Target Date  07/28/18      PT SHORT TERM GOAL #2   Title  Patient will increase six minute walk test distance to >1000 for progression to community ambulator and improve gait ability    Baseline  585 ft    Time  6    Period  Weeks    Status  New    Target Date  07/28/18        PT Long Term Goals - 07/20/18 1528      PT LONG TERM GOAL #1   Title  Patient will increase 10 meter walk test to >1.43m/s as to improve gait speed for better community ambulation and to reduce fall risk.    Baseline  07/20/18= .95 m/sec    Time  12    Period  Weeks    Status  On-going    Target Date  09/07/18      PT LONG TERM GOAL #2   Title  Patient will ascend/descend 4 stairs without rail assist independently without loss of balance to improve ability to get in/out of home.     Baseline  , needs rail 07/20/18    Time  12    Period   Weeks    Status  On-going    Target Date  09/07/18      PT LONG TERM GOAL #3   Title   Patient will demonstrate an improved Berg Balance Score of >  as to demonstrate improved balance with ADLs such as sitting/standing and transfer balance and reduced fall risk    Baseline  49/56 eval, 52/56 07/20/18    Time  12    Period  Weeks    Status  On-going    Target Date  09/07/18      PT LONG TERM GOAL #4   Title  Patient will increase BLE gross strength to 4+/5 as to improve functional strength for independent gait, increased standing tolerance and increased ADL ability.    Baseline  4/5 BLE 07/20/18    Time  12    Period  Weeks    Status  On-going    Target Date  09/07/18            Plan - 08/03/18 1510    Clinical Impression Statement  Pt was able to perform all exercises today with CGA. Pt was able to perform all balance and strength exercises, demonstrating improvements in LE strength and stability.  Pt was able to complete dynamic balance exercises, showing ability to stand on even surfaces with min assist and improve postural reactions to correct self during activities.  Pt requires verbal, visual and tactile cues during exercise in order to complete tasks with proper form and technique, as well as to stay on task.  Pt would continue to benefit from skilled PT services in order to further strengthen LE's, improve static and dynamic balance, and improve coordination in order to increase functional mobility and decrease risk of falls    Rehab Potential  Good    PT Frequency  2x / week    PT Duration  12 weeks    PT Treatment/Interventions  Manual techniques;Patient/family education;Balance training;Functional mobility training;Therapeutic activities;Therapeutic exercise;Gait training;Aquatic Therapy       Patient will benefit from skilled therapeutic intervention in order to improve the following deficits and impairments:  Abnormal gait, Decreased balance, Decreased endurance,  Decreased mobility, Difficulty walking, Decreased activity tolerance, Decreased safety awareness, Decreased strength  Visit Diagnosis: Aphasia  Muscle weakness (generalized)  Difficulty in walking, not elsewhere classified  Expressive aphasia     Problem List There are no active problems to display for this patient.   [address] [place] DPT 08/03/2018, 3:12 PM  [place] [facility] MAIN [facility] SERVICES [address] [place], [place], [address] Phone: [phone]   Fax:  [phone]  Name: ADYA WIRZ MRN: [id number] Date of Birth: May 06, 1941

## 2018-08-06 ENCOUNTER — Ambulatory Visit: Payer: Medicare Other | Attending: Nurse Practitioner | Admitting: Speech Pathology

## 2018-08-06 ENCOUNTER — Ambulatory Visit: Payer: Medicare Other | Admitting: Physical Therapy

## 2018-08-06 ENCOUNTER — Encounter: Payer: Self-pay | Admitting: Physical Therapy

## 2018-08-06 ENCOUNTER — Encounter: Payer: Self-pay | Admitting: Speech Pathology

## 2018-08-06 DIAGNOSIS — M6281 Muscle weakness (generalized): Secondary | ICD-10-CM | POA: Diagnosis present

## 2018-08-06 DIAGNOSIS — R4701 Aphasia: Secondary | ICD-10-CM | POA: Diagnosis not present

## 2018-08-06 DIAGNOSIS — R262 Difficulty in walking, not elsewhere classified: Secondary | ICD-10-CM | POA: Insufficient documentation

## 2018-08-06 NOTE — Therapy (Signed)
[place] [facility] MAIN [facility] SERVICES [address] [place], [place], [address] Phone: [phone]   Fax:  [phone]  Physical Therapy Treatment  Patient Details  Name: Adriana Hughes MRN: [id number] Date of Birth: December 10, 1940 Referring Provider (PT): Clearence Cheek    Encounter Date: 08/06/2018  PT End of Session - 08/06/18 1408    Visit Number  25    Number of Visits  25    Date for PT Re-Evaluation  09/07/18    Authorization Type  5/10    PT Start Time  0200    PT Stop Time  0240    PT Time Calculation (min)  40 min    Equipment Utilized During Treatment  Gait belt    Activity Tolerance  Patient tolerated treatment well;Patient limited by fatigue    Behavior During Therapy  [facility] for tasks assessed/performed       Past Medical History:  Diagnosis Date  . Arthritis   . Fibrocystic breast disease   . GERD (gastroesophageal reflux disease)   . Hyperlipidemia   . Hypertension     Past Surgical History:  Procedure Laterality Date  . APPENDECTOMY    . BREAST BIOPSY    . BREAST EXCISIONAL BIOPSY Bilateral [id number]   benign  . EYE SURGERY    . TEE WITHOUT CARDIOVERSION N/A 04/21/2018   Procedure: TRANSESOPHAGEAL ECHOCARDIOGRAM (TEE);  Surgeon: Lamar Blinks, MD;  Location: [facility] ORS;  Service: Cardiovascular;  Laterality: N/A;    There were no vitals filed for this visit.  Subjective Assessment - 08/06/18 1409    Subjective  Pt reports that she is doing well today. She denies pain currently and has no specific questions or concerns. She denies recent falls or recent changes in her health. HEP is going well.     Patient is accompained by:  Family member    Pertinent History  Per discharge summary from [facility]: Ischemic Stroke s/p alteplase and IA:  Presented initially with symptoms on arrival notable for left gaze deviation, right sided hemiparesis, hemianopia, and right facial droop. Primary stroke risk factors: hypertension. She was found  to be a candidate for tPa that she received on 5/15. She was also a candidate for IA and brought in for successful mechanical thrombectomy with good restoration of blood flow after one pass, TICI 3. Patient monitored in the ICU initially and required intubation due to frequent vomiting and concern for airway protection. She was successfully extubated on 5/16 and transferred to the floor without issues. Risk stratification obtained while inpatient as follows: LDL: 126, A1C: 5.8, TTE reveals EF >55% without evidence of intracardiac thrombus. 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 Post tPA [facility] 5/15 showed interval left basal ganglia hyperdensity, so repeat CTH ordered on 5/16 showing resolution of finding which likely represented contrast staining from IA procedure     How long can you stand comfortably?  no limits    Patient Stated Goals  Patient wants to be able to walk longer distances and not walk towards the right side.     Currently in Pain?  No/denies    Multiple Pain Sites  No      Treatment Instructed patient in LE strengthening as part of HEP Seated with green tband around BLE: hip flexion march x15 bilaterally; hip abduction x15 bilaterally; Hooklying abd/ER with gTB x 20 Hooklying marching with 3 lb weights x 20  LAQ x15  bilaterally x15 bilaterally; Standing hip abd x 15 BLE Standing hip extension x 15 BLE Patient required min-moderate verbal/tactile cues for correct exercise technique with cues for tband placement and to increase ROM for better strengthening;                         PT Education - 08/06/18 1408    Education Details  HEP    Person(s) Educated  Patient    Methods  Explanation    Comprehension  Verbalized understanding;Returned demonstration;Need further instruction       PT Short Term Goals - 06/16/18 1610      PT SHORT TERM GOAL #1   Title  Patient will be  independent in home exercise program to improve strength/mobility for better functional independence with ADLs.    Time  6    Period  Weeks    Status  New    Target Date  07/28/18      PT SHORT TERM GOAL #2   Title  Patient will increase six minute walk test distance to >1000 for progression to community ambulator and improve gait ability    Baseline  585 ft    Time  6    Period  Weeks    Status  New    Target Date  07/28/18        PT Long Term Goals - 07/20/18 1528      PT LONG TERM GOAL #1   Title  Patient will increase 10 meter walk test to >1.35m/s as to improve gait speed for better community ambulation and to reduce fall risk.    Baseline  07/20/18= .95 m/sec    Time  12    Period  Weeks    Status  On-going    Target Date  09/07/18      PT LONG TERM GOAL #2   Title  Patient will ascend/descend 4 stairs without rail assist independently without loss of balance to improve ability to get in/out of home.     Baseline  , needs rail 07/20/18    Time  12    Period  Weeks    Status  On-going    Target Date  09/07/18      PT LONG TERM GOAL #3   Title   Patient will demonstrate an improved Berg Balance Score of > as to demonstrate improved balance with ADLs such as sitting/standing and transfer balance and reduced fall risk    Baseline  49/56 eval, 52/56 07/20/18    Time  12    Period  Weeks    Status  On-going    Target Date  09/07/18      PT LONG TERM GOAL #4   Title  Patient will increase BLE gross strength to 4+/5 as to improve functional strength for independent gait, increased standing tolerance and increased ADL ability.    Baseline  4/5 BLE 07/20/18    Time  12    Period  Weeks    Status  On-going    Target Date  09/07/18            Plan - 08/06/18 1411    Clinical Impression Statement  Pt requires direction and verbal cues for correct performance of standing dynamic balance exercises and strengthening exercises. .Patient has fatigue with endurance and  difficulty with transfers without UE support and standing tasks.  Patient struggles with posture and ability to perform sit to stand due to weakness,  as well as balance with mobility.  Pt encouraged continuing HEP   Patient will benefit from continued skilled PT to improve mobility and safety.    Rehab Potential  Good    PT Frequency  2x / week    PT Duration  12 weeks    PT Treatment/Interventions  Manual techniques;Patient/family education;Balance training;Functional mobility training;Therapeutic activities;Therapeutic exercise;Gait training;Aquatic Therapy       Patient will benefit from skilled therapeutic intervention in order to improve the following deficits and impairments:  Abnormal gait, Decreased balance, Decreased endurance, Decreased mobility, Difficulty walking, Decreased activity tolerance, Decreased safety awareness, Decreased strength  Visit Diagnosis: Difficulty in walking, not elsewhere classified  Muscle weakness (generalized)     Problem List There are no active problems to display for this patient.   Ezekiel Ina,  DPT 08/06/2018, 2:12 PM  [facility] MAIN [facility] SERVICES [address] [place], [place], [address] Phone: [phone]   Fax:  [phone]  Name: JADIRA NIERMAN MRN: [id number] Date of Birth: 1941-09-09

## 2018-08-06 NOTE — Therapy (Signed)
[place] MAIN [facility] SERVICES [address] [place], [place], [address] Phone: [phone]   Fax:  [phone]  Speech Language Pathology Treatment  Patient Details  Name: Adriana Hughes MRN: [id number] Date of Birth: Nov 17, 1940 Referring Provider (SLP): [name], NP   Encounter Date: 08/06/2018  End of Session - 08/06/18 1511    Visit Number  15    Number of Visits  33    Date for SLP Re-Evaluation  09/25/18    Authorization Type  4/10 medicare progress note    SLP Start Time  1300    SLP Stop Time   1352    SLP Time Calculation (min)  52 min    Activity Tolerance  Patient tolerated treatment well       Past Medical History:  Diagnosis Date  . Arthritis   . Fibrocystic breast disease   . GERD (gastroesophageal reflux disease)   . Hyperlipidemia   . Hypertension     Past Surgical History:  Procedure Laterality Date  . APPENDECTOMY    . BREAST BIOPSY    . BREAST EXCISIONAL BIOPSY Bilateral [id number]   benign  . EYE SURGERY    . TEE WITHOUT CARDIOVERSION N/A 04/21/2018   Procedure: TRANSESOPHAGEAL ECHOCARDIOGRAM (TEE);  Surgeon: Corey Skains, MD;  Location: [facility] ORS;  Service: Cardiovascular;  Laterality: N/A;    There were no vitals filed for this visit.  Subjective Assessment - 08/06/18 1509    Subjective  Patient reported she is doing well. She has no complaints. She reported she completed all her homework.    Currently in Pain?  No/denies            ADULT SLP TREATMENT - 08/06/18 0001      General Information   Behavior/Cognition  Alert;Cooperative;Pleasant mood    Patient Positioning  Upright in chair    HPI  Per discharge summary from [facility]: Ischemic Stroke s/p alteplase and IA:  Presented initially with symptoms on arrival notable for left gaze deviation, right sided hemiparesis, hemianopia, and right facial droop. Primary stroke risk factors: hypertension. She was found to be a candidate for tPa that  she received on 5/15. She was also a candidate for IA and brought in for successful mechanical thrombectomy with good restoration of blood flow after one pass, TICI 3. Patient monitored in the ICU initially and required intubation due to frequent vomiting and concern for airway protection. She was successfully extubated on 5/16 and transferred to the floor without issues. Risk stratification obtained while inpatient as follows: LDL: 126, A1C: 5.8, TTE reveals EF >55% without evidence of intracardiac thrombus. 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       Treatment Provided   Treatment provided  Cognitive-Linquistic      Pain Assessment   Pain Assessment  No/denies pain      Cognitive-Linquistic Treatment   Treatment focused on  Aphasia    Skilled Treatment Expressive Aphasia Treatment:   Patient corrected absurd sentences by replacing erroneous semantic choice with an appropriate word choice with 100% accuracy given min verbal cues, 80% accuracy independently.  Patient described differences and similarities of 2 target words with 100% accuracy given min to mod verbal cues, 75% accuracy independently.  Patient named 10 members in a given category with 100% accuracy given min to mod verbal cues (named 4-6 independently depending on the category named).  Patient composed and wrote  novel, grammatically correct sentences to describe the function of a given target word with 90% accuracy given min verbal cues, 70% accuracy independently. Used lined paper to facilitate larger letter formation to return letter height to premorbid level.     Assessment / Recommendations / Plan   Plan  Continue with current plan of care      Progression Toward Goals   Progression toward goals  Progressing toward goals       SLP Education - 08/06/18 1510    Education Details  Compensatory strategies for word finding to aid verbal and written  communication, visual aids to increase the size & accuracy of her writing    Person(s) Educated  Patient    Methods  Explanation;Demonstration;Handout    Comprehension  Verbalized understanding;Need further instruction;Returned demonstration;Verbal cues required         SLP Long Term Goals - 07/30/18 1408      SLP LONG TERM GOAL #1   Title  Patient will describe pictured objects by naming 3-5 semantic features independently.    Baseline  Previous goal met: Patient will describe pictured objects by naming 3 semantic features with 80% accuracy given min verbal cues. (met 07/30/18)    Time  8    Period  Weeks    Status  Revised      SLP LONG TERM GOAL #2   Title  Patient will name 10 members in a given category with 80% accuracy independently.    Baseline  Previous goal met: Patient will name 10 members in a given category with 80% accuracy given min verbal cues. ( met 07/30/18)    Time  16    Period  Weeks    Status  Revised      SLP LONG TERM GOAL #3   Title  Patient will compose a novel sentence when given 1-2 target words or when given a picture with 80% accuracy independently.    Baseline  Previous goal partially met: Patient will compose a novel sentence when given 1-2 target words or when given a picture with 80% accuracy independently. (revised 07/30/18)    Time  16    Period  Weeks    Status  Revised      SLP LONG TERM GOAL #4   Title  Patient will name semantic features/descriptors when having word finding difficulty in conversation with 80% accuracy independently.    Baseline  Previous goal: Patient will name semantic features/descriptors when having word finding difficulty in conversation with 100% accuracy given min verbal cues., revised 5/46/27 for recert    Time  16    Period  Weeks    Status  Revised      SLP LONG TERM GOAL #5   Title  Patient will be independent with resonant voice exercises.    Time  8    Period  Weeks    Status  Deferred       Plan -  08/06/18 1512    Clinical Impression Statement  Patient demonstrated continued emerging independence using compensatory strategies to aid word finding in conversation (semantic feature analysis). Required regular verbal cues to name >'er than 5 items in a named category. Improved written sentence construction; however requires cues to review writing to correct grammatical and spelling errors. Patient continues with written paraphasias in single sentences, able to correct given min verbal cues.    Speech Therapy Frequency  2x / week    Duration  Other (comment)   8 weeks  Treatment/Interventions  Compensatory strategies;SLP instruction and feedback;Patient/family education;Functional tasks    Potential to Achieve Goals  Good    Potential Considerations  Ability to learn/carryover information;Family/community support;Cooperation/participation level    SLP Home Exercise Plan  Word finding exercises, home practice of compensatory strategies for word finding, copying a list of words on lined paper to guide size of lettering    Consulted and Agree with Plan of Care  Patient       Patient will benefit from skilled therapeutic intervention in order to improve the following deficits and impairments:   Aphasia    Problem List There are no active problems to display for this patient.   Herta Hink, MA, CCC-SLP 08/06/2018, 3:26 PM  [place] MAIN [facility] SERVICES [address] [place], [place], [address] Phone: [phone]   Fax:  [phone]   Name: Adriana Hughes MRN: [id number] Date of Birth: 1941/04/19

## 2018-08-10 ENCOUNTER — Encounter: Payer: Self-pay | Admitting: Speech Pathology

## 2018-08-10 ENCOUNTER — Ambulatory Visit: Payer: Medicare Other | Admitting: Speech Pathology

## 2018-08-10 ENCOUNTER — Ambulatory Visit: Payer: Medicare Other | Admitting: Physical Therapy

## 2018-08-10 ENCOUNTER — Encounter: Payer: Self-pay | Admitting: Physical Therapy

## 2018-08-10 DIAGNOSIS — R4701 Aphasia: Secondary | ICD-10-CM | POA: Diagnosis not present

## 2018-08-10 DIAGNOSIS — M6281 Muscle weakness (generalized): Secondary | ICD-10-CM

## 2018-08-10 DIAGNOSIS — R262 Difficulty in walking, not elsewhere classified: Secondary | ICD-10-CM

## 2018-08-10 NOTE — Therapy (Signed)
[facility] MAIN [facility] SERVICES [address] [place], [place], [address] Phone: [phone]   Fax:  [phone]  Physical Therapy Treatment  Patient Details  Name: Adriana Hughes MRN: [id number] Date of Birth: 1941/01/09 Referring Provider (PT): Clearence Cheek    Encounter Date: 08/10/2018  PT End of Session - 08/10/18 1308    Visit Number  25    Number of Visits  41    Date for PT Re-Evaluation  09/07/18    Authorization Type  6/10    PT Start Time  0105    PT Stop Time  0145    PT Time Calculation (min)  40 min    Equipment Utilized During Treatment  Gait belt    Activity Tolerance  Patient tolerated treatment well;Patient limited by fatigue    Behavior During Therapy  [facility] for tasks assessed/performed       Past Medical History:  Diagnosis Date  . Arthritis   . Fibrocystic breast disease   . GERD (gastroesophageal reflux disease)   . Hyperlipidemia   . Hypertension     Past Surgical History:  Procedure Laterality Date  . APPENDECTOMY    . BREAST BIOPSY    . BREAST EXCISIONAL BIOPSY Bilateral [id number]   benign  . EYE SURGERY    . TEE WITHOUT CARDIOVERSION N/A 04/21/2018   Procedure: TRANSESOPHAGEAL ECHOCARDIOGRAM (TEE);  Surgeon: Lamar Blinks, MD;  Location: [facility] ORS;  Service: Cardiovascular;  Laterality: N/A;    There were no vitals filed for this visit.  Subjective Assessment - 08/10/18 1307    Subjective  Pt reports that she is doing well today. She denies pain currently and has no specific questions or concerns. She denies recent falls or recent changes in her health. HEP is going well.     Patient is accompained by:  Family member    Pertinent History  Per discharge summary from [facility]: Ischemic Stroke s/p alteplase and IA:  Presented initially with symptoms on arrival notable for left gaze deviation, right sided hemiparesis, hemianopia, and right facial droop. Primary stroke risk factors: hypertension. She was found  to be a candidate for tPa that she received on 5/15. She was also a candidate for IA and brought in for successful mechanical thrombectomy with good restoration of blood flow after one pass, TICI 3. Patient monitored in the ICU initially and required intubation due to frequent vomiting and concern for airway protection. She was successfully extubated on 5/16 and transferred to the floor without issues. Risk stratification obtained while inpatient as follows: LDL: 126, A1C: 5.8, TTE reveals EF >55% without evidence of intracardiac thrombus. 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 Post tPA [facility] 5/15 showed interval left basal ganglia hyperdensity, so repeat CTH ordered on 5/16 showing resolution of finding which likely represented contrast staining from IA procedure     How long can you stand comfortably?  no limits    Patient Stated Goals  Patient wants to be able to walk longer distances and not walk towards the right side.     Currently in Pain?  No/denies    Multiple Pain Sites  No       Therapeutic exercise: Review HEP: Heel raises x20 reps bilaterally, BUE support in // bars, supervision for safety Marching x15 reps each leg, BUE support in // bars, supervision for safety Tandem stance, eyes open, x10 sec holds with each foot in  rear x2 bouts each foot, supervision for safety, no UE support Tandem stance, eyes closed, x10 sec holds with each foot in rear x2 bouts each foot, supervision for safety, no UE support -VCs for proper technique and positioning for each exercise    NEUROMUSCULAR RE-EDUCATION   Toe tapping 1 step without UE assist x 20 , cues for technique   Side stepping  gait in // bars x 4 laps , posture correction cues   Tandem standing on 1/2 foam with flat side up and cone stacking on stool with need of min assist x 20 cones x 2 sets    TM side stepping with elevation 2 and . 4 miles / hour , 25 % UE  support   Cues for proper technique of exercises, slow eccentric contractions to target specific muscles                          PT Education - 08/10/18 1307    Education Details  HEP, safety    Person(s) Educated  Patient    Methods  Explanation;Demonstration    Comprehension  Verbalized understanding;Returned demonstration;Need further instruction       PT Short Term Goals - 06/16/18 6045      PT SHORT TERM GOAL #1   Title  Patient will be independent in home exercise program to improve strength/mobility for better functional independence with ADLs.    Time  6    Period  Weeks    Status  New    Target Date  07/28/18      PT SHORT TERM GOAL #2   Title  Patient will increase six minute walk test distance to >1000 for progression to community ambulator and improve gait ability    Baseline  585 ft    Time  6    Period  Weeks    Status  New    Target Date  07/28/18        PT Long Term Goals - 07/20/18 1528      PT LONG TERM GOAL #1   Title  Patient will increase 10 meter walk test to >1.2m/s as to improve gait speed for better community ambulation and to reduce fall risk.    Baseline  07/20/18= .95 m/sec    Time  12    Period  Weeks    Status  On-going    Target Date  09/07/18      PT LONG TERM GOAL #2   Title  Patient will ascend/descend 4 stairs without rail assist independently without loss of balance to improve ability to get in/out of home.     Baseline  , needs rail 07/20/18    Time  12    Period  Weeks    Status  On-going    Target Date  09/07/18      PT LONG TERM GOAL #3   Title   Patient will demonstrate an improved Berg Balance Score of > as to demonstrate improved balance with ADLs such as sitting/standing and transfer balance and reduced fall risk    Baseline  49/56 eval, 52/56 07/20/18    Time  12    Period  Weeks    Status  On-going    Target Date  09/07/18      PT LONG TERM GOAL #4   Title  Patient will increase BLE gross  strength to 4+/5 as to improve functional strength for independent gait, increased standing tolerance  and increased ADL ability.    Baseline  4/5 BLE 07/20/18    Time  12    Period  Weeks    Status  On-going    Target Date  09/07/18            Plan - 08/10/18 1308    Clinical Impression Statement  Patient instructed in advanced dynamic and static balance exercise. Utilized stable and uneven surfaces to further challenge dynamic balance. Patient required min Vcs for correct positioning and exercise technique! Patient had a difficult time during narrow base of support challenges. He exhibits better SLS ability being able to progress to foam; Patient would benefit from additional skilled PT Intervention to improve strength, balance and gait safety    Rehab Potential  Good    PT Frequency  2x / week    PT Duration  12 weeks    PT Treatment/Interventions  Manual techniques;Patient/family education;Balance training;Functional mobility training;Therapeutic activities;Therapeutic exercise;Gait training;Aquatic Therapy       Patient will benefit from skilled therapeutic intervention in order to improve the following deficits and impairments:  Abnormal gait, Decreased balance, Decreased endurance, Decreased mobility, Difficulty walking, Decreased activity tolerance, Decreased safety awareness, Decreased strength  Visit Diagnosis: Difficulty in walking, not elsewhere classified  Muscle weakness (generalized)     Problem List There are no active problems to display for this patient.   [address], [place] DPT 08/10/2018, 1:11 PM  [place] [facility] MAIN [facility] SERVICES [address] [place], [place], [address] Phone: [phone]   Fax:  [phone]  Name: RENEA SCHOONMAKER MRN: [id number] Date of Birth: 1941-05-25

## 2018-08-10 NOTE — Therapy (Signed)
[place] MAIN [facility] SERVICES [address] [place], [place], [address] Phone: [phone]   Fax:  [phone]  Speech Language Pathology Treatment  Patient Details  Name: Adriana Hughes MRN: [id number] Date of Birth: Nov 23, 1940 Referring Provider (SLP): [name], NP   Encounter Date: 08/10/2018  End of Session - 08/10/18 1652    Visit Number  16    Number of Visits  33    Date for SLP Re-Evaluation  09/25/18    Authorization Type  5/10 medicare progress note    SLP Start Time  1400    SLP Stop Time   1452    SLP Time Calculation (min)  52 min    Activity Tolerance  Patient tolerated treatment well       Past Medical History:  Diagnosis Date  . Arthritis   . Fibrocystic breast disease   . GERD (gastroesophageal reflux disease)   . Hyperlipidemia   . Hypertension     Past Surgical History:  Procedure Laterality Date  . APPENDECTOMY    . BREAST BIOPSY    . BREAST EXCISIONAL BIOPSY Bilateral 7530,0511   benign  . EYE SURGERY    . TEE WITHOUT CARDIOVERSION N/A 04/21/2018   Procedure: TRANSESOPHAGEAL ECHOCARDIOGRAM (TEE);  Surgeon: Corey Skains, MD;  Location: [facility] ORS;  Service: Cardiovascular;  Laterality: N/A;    There were no vitals filed for this visit.  Subjective Assessment - 08/10/18 1651    Subjective  Patient reported she had a good weekend. She reported no complaints and reported she completed some of her homework.    Currently in Pain?  No/denies            ADULT SLP TREATMENT - 08/10/18 0001      General Information   Behavior/Cognition  Alert;Cooperative;Pleasant mood    Patient Positioning  Upright in chair    HPI  Per discharge summary from [facility]: Ischemic Stroke s/p alteplase and IA:  Presented initially with symptoms on arrival notable for left gaze deviation, right sided hemiparesis, hemianopia, and right facial droop. Primary stroke risk factors: hypertension. She was found to be a candidate  for tPa that she received on 5/15. She was also a candidate for IA and brought in for successful mechanical thrombectomy with good restoration of blood flow after one pass, TICI 3. Patient monitored in the ICU initially and required intubation due to frequent vomiting and concern for airway protection. She was successfully extubated on 5/16 and transferred to the floor without issues. 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       Treatment Provided   Treatment provided  Cognitive-Linquistic      Pain Assessment   Pain Assessment  No/denies pain      Cognitive-Linquistic Treatment   Treatment focused on  Aphasia    Skilled Treatment  Patient named 4-5 semantic features given pictured objects with 100% accuracy given min verbal cues and named 4-5 semantic features given a target word with 100% accuracy given visual cues via semantic feature analysis chart and min to mod verbal cues (70% independently.)  Patient named 5 items in a named category with 100% accuracy independently and 10 items given min to mod  verbal cues.  Patient wrote a novel sentence given a picture describing semantic features with 90% accuracy given min to mod verbal cues.  Patient named and wrote synonyms with 100% accuracy given min to  mod verbal cues and antonyms with 90% accuracy independently.     Assessment / Recommendations / Plan   Plan  Continue with current plan of care      Progression Toward Goals   Progression toward goals  Progressing toward goals       SLP Education - 08/10/18 1652    Education Details  Compensatory strategies for word finding to aid verbal and written communication, visual aids to increase the size and accuracy of her writing    Person(s) Educated  Patient    Methods  Explanation;Demonstration;Verbal cues    Comprehension  Verbalized understanding;Need further instruction;Returned demonstration;Verbal cues  required         SLP Long Term Goals - 07/30/18 1408      SLP LONG TERM GOAL #1   Title  Patient will describe pictured objects by naming 3-5 semantic features independently.    Baseline  Previous goal met: Patient will describe pictured objects by naming 3 semantic features with 80% accuracy given min verbal cues. (met 07/30/18)    Time  8    Period  Weeks    Status  Revised      SLP LONG TERM GOAL #2   Title  Patient will name 10 members in a given category with 80% accuracy independently.    Baseline  Previous goal met: Patient will name 10 members in a given category with 80% accuracy given min verbal cues. ( met 07/30/18)    Time  16    Period  Weeks    Status  Revised      SLP LONG TERM GOAL #3   Title  Patient will compose a novel sentence when given 1-2 target words or when given a picture with 80% accuracy independently.    Baseline  Previous goal partially met: Patient will compose a novel sentence when given 1-2 target words or when given a picture with 80% accuracy independently. (revised 07/30/18)    Time  16    Period  Weeks    Status  Revised      SLP LONG TERM GOAL #4   Title  Patient will name semantic features/descriptors when having word finding difficulty in conversation with 80% accuracy independently.    Baseline  Previous goal: Patient will name semantic features/descriptors when having word finding difficulty in conversation with 100% accuracy given min verbal cues., revised 02/11/80 for recert    Time  16    Period  Weeks    Status  Revised      SLP LONG TERM GOAL #5   Title  Patient will be independent with resonant voice exercises.    Time  8    Period  Weeks    Status  Deferred       Plan - 08/10/18 1653    Clinical Impression Statement  Patient demonstrated consistent and effective use of SFA chart to prompt naming of semantic features to aid word finding. Without use of chart to prompt naming of semantic features, patient names an average of 2  semantic features which are often not enough to aid independent word retrieval in conversation.   Speech Therapy Frequency  2x / week    Duration  Other (comment)   8 weeks   Treatment/Interventions  Compensatory strategies;SLP instruction and feedback;Patient/family education;Functional tasks    Potential to Achieve Goals  Good    Potential Considerations  Ability to learn/carryover information;Family/community support;Cooperation/participation level    SLP Home Exercise Plan  Word finding exercises, home  practice of compensatory strategies for word finding, copying a list of words on lined paper to guide size of lettering    Consulted and Agree with Plan of Care  Patient       Patient will benefit from skilled therapeutic intervention in order to improve the following deficits and impairments:   Aphasia    Problem List There are no active problems to display for this patient.   Navaya Wiatrek, MA, CCC-SLP 08/10/2018, 4:54 PM  [place] MAIN [facility] SERVICES [address] [place], [place], [address] Phone: [phone]   Fax:  [phone]   Name: Adriana Hughes MRN: [id number] Date of Birth: 03-31-1941

## 2018-08-12 ENCOUNTER — Encounter: Payer: Medicare Other | Admitting: Speech Pathology

## 2018-08-12 ENCOUNTER — Ambulatory Visit: Payer: Medicare Other | Admitting: Physical Therapy

## 2018-08-13 ENCOUNTER — Encounter: Payer: Self-pay | Admitting: Physical Therapy

## 2018-08-13 ENCOUNTER — Ambulatory Visit: Payer: Medicare Other | Admitting: Physical Therapy

## 2018-08-13 ENCOUNTER — Ambulatory Visit: Payer: Medicare Other | Admitting: Speech Pathology

## 2018-08-13 ENCOUNTER — Encounter: Payer: Self-pay | Admitting: Speech Pathology

## 2018-08-13 DIAGNOSIS — M6281 Muscle weakness (generalized): Secondary | ICD-10-CM

## 2018-08-13 DIAGNOSIS — R262 Difficulty in walking, not elsewhere classified: Secondary | ICD-10-CM

## 2018-08-13 DIAGNOSIS — R4701 Aphasia: Secondary | ICD-10-CM | POA: Diagnosis not present

## 2018-08-13 NOTE — Therapy (Signed)
[place] [facility] MAIN [facility] SERVICES [address] [place], [place], [address] Phone: [phone]   Fax:  [phone]  Physical Therapy Treatment  Patient Details  Name: Adriana Hughes MRN: [id number] Date of Birth: 1941/02/19 Referring Provider (PT): Clearence Cheek    Encounter Date: 08/13/2018  PT End of Session - 08/13/18 1330    Visit Number  26    Number of Visits  41    Date for PT Re-Evaluation  09/07/18    Authorization Type  7/10    PT Start Time  0105    PT Stop Time  0145    PT Time Calculation (min)  40 min    Equipment Utilized During Treatment  Gait belt    Activity Tolerance  Patient tolerated treatment well;Patient limited by fatigue    Behavior During Therapy  [facility] for tasks assessed/performed       Past Medical History:  Diagnosis Date  . Arthritis   . Fibrocystic breast disease   . GERD (gastroesophageal reflux disease)   . Hyperlipidemia   . Hypertension     Past Surgical History:  Procedure Laterality Date  . APPENDECTOMY    . BREAST BIOPSY    . BREAST EXCISIONAL BIOPSY Bilateral [id number]   benign  . EYE SURGERY    . TEE WITHOUT CARDIOVERSION N/A 04/21/2018   Procedure: TRANSESOPHAGEAL ECHOCARDIOGRAM (TEE);  Surgeon: Lamar Blinks, MD;  Location: [facility] ORS;  Service: Cardiovascular;  Laterality: N/A;    There were no vitals filed for this visit.  Subjective Assessment - 08/13/18 1328    Subjective  Pt reports that she is doing well today. She denies pain currently and has no specific questions or concerns. She denies recent falls or recent changes in her health. HEP is going well.     Patient is accompained by:  Family member    Pertinent History  Per discharge summary from [facility]: Ischemic Stroke s/p alteplase and IA:  Presented initially with symptoms on arrival notable for left gaze deviation, right sided hemiparesis, hemianopia, and right facial droop. Primary stroke risk factors: hypertension. She was found  to be a candidate for tPa that she received on 5/15. She was also a candidate for IA and brought in for successful mechanical thrombectomy with good restoration of blood flow after one pass, TICI 3. Patient monitored in the ICU initially and required intubation due to frequent vomiting and concern for airway protection. She was successfully extubated on 5/16 and transferred to the floor without issues. Risk stratification obtained while inpatient as follows: LDL: 126, A1C: 5.8, TTE reveals EF >55% without evidence of intracardiac thrombus. 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 Post tPA [facility] 5/15 showed interval left basal ganglia hyperdensity, so repeat CTH ordered on 5/16 showing resolution of finding which likely represented contrast staining from IA procedure     How long can you stand comfortably?  no limits    Patient Stated Goals  Patient wants to be able to walk longer distances and not walk towards the right side.     Currently in Pain?  No/denies    Multiple Pain Sites  No       Treatment:  TM .1.2 miles / hour  Fwd/ bwd walking fast and stopping, turning with fast walking x5 laps each with mod VCS for sequencing.. Patient had difficulty with turning left quickly and changing from forward to backward walking;  Forward walking level surface eyes closed x20 feet x3laps  Backward walking eyes closed level surface x20 feet x3 laps;path deviation with bwd walking  Step up from foam for SLS 3 sec hold x5 reps each LE, unsupported; CGA for safety with cues to improve trunk control for better static balance;  Side step up and over 4 inch step with airex pad, X 10 25 % need of UE for support  Bosu ball lunges x 20 x 2   Advanced HEP with backward walking;  Leg press 90 lbs x 20 x 2; heel raises 60 lbs x 20 x 2    CGA and Min  verbal cues used throughout with increased in postural sway and LOB  most seen with narrow base of support and while on uneven surfaces. Continues to have balance deficits typical with diagnosis. Patient performs intermediate level exercises without pain behaviors and needs verbal cuing for postural alignment and head positioning                          PT Education - 08/13/18 1328    Education Details  HEP    Person(s) Educated  Patient    Methods  Explanation;Demonstration    Comprehension  Verbalized understanding;Need further instruction;Returned demonstration       PT Short Term Goals - 06/16/18 0808      PT SHORT TERM GOAL #1   Title  Patient will be independent in home exercise program to improve strength/mobility for better functional independence with ADLs.    Time  6    Period  Weeks    Status  New    Target Date  07/28/18      PT SHORT TERM GOAL #2   Title  Patient will increase six minute walk test distance to >1000 for progression to community ambulator and improve gait ability    Baseline  585 ft    Time  6    Period  Weeks    Status  New    Target Date  07/28/18        PT Long Term Goals - 07/20/18 1528      PT LONG TERM GOAL #1   Title  Patient will increase 10 meter walk test to >1.61m/s as to improve gait speed for better community ambulation and to reduce fall risk.    Baseline  07/20/18= .95 m/sec    Time  12    Period  Weeks    Status  On-going    Target Date  09/07/18      PT LONG TERM GOAL #2   Title  Patient will ascend/descend 4 stairs without rail assist independently without loss of balance to improve ability to get in/out of home.     Baseline  , needs rail 07/20/18    Time  12    Period  Weeks    Status  On-going    Target Date  09/07/18      PT LONG TERM GOAL #3   Title   Patient will demonstrate an improved Berg Balance Score of > as to demonstrate improved balance with ADLs such as sitting/standing and transfer balance and reduced fall risk    Baseline  49/56 eval, 52/56 07/20/18     Time  12    Period  Weeks    Status  On-going    Target Date  09/07/18      PT LONG TERM GOAL #4   Title  Patient will increase BLE gross strength to 4+/5 as to improve functional strength for independent gait, increased standing tolerance and increased ADL ability.    Baseline  4/5 BLE 07/20/18    Time  12    Period  Weeks    Status  On-going    Target Date  09/07/18            Plan - 08/13/18 1332    Clinical Impression Statement  Pt requires direction and verbal cues for correct performance of exercises. Patient demonstrates weakness in BLE and performs open and closed chain exercises with no reports of pain. Pt was able to perform all exercises with mod assist and VC for technique. Patient struggles with control during movement as well as leg position with unstable surfaces.  Pt encouraged continuing HEP .Follow-up as scheduled.    Rehab Potential  Good    PT Frequency  2x / week    PT Duration  12 weeks    PT Treatment/Interventions  Manual techniques;Patient/family education;Balance training;Functional mobility training;Therapeutic activities;Therapeutic exercise;Gait training;Aquatic Therapy       Patient will benefit from skilled therapeutic intervention in order to improve the following deficits and impairments:  Abnormal gait, Decreased balance, Decreased endurance, Decreased mobility, Difficulty walking, Decreased activity tolerance, Decreased safety awareness, Decreased strength  Visit Diagnosis: Difficulty in walking, not elsewhere classified  Muscle weakness (generalized)     Problem List There are no active problems to display for this patient.   Ezekiel Ina , Impact DPT 08/13/2018, 1:35 PM  [place] [facility] MAIN [facility] SERVICES [address] [place], [place], [address] Phone: [phone]   Fax:  [phone]  Name: Adriana Hughes MRN: [id number] Date of Birth: [birth year]

## 2018-08-13 NOTE — Therapy (Signed)
[place] MAIN [facility] SERVICES [address] [place], [place], [address] Phone: [phone]   Fax:  [phone]  Speech Language Pathology Treatment  Patient Details  Name: Adriana Hughes MRN: [id number] Date of Birth: 1941-07-19 Referring Provider (SLP): [name], NP   Encounter Date: 08/13/2018  End of Session - 08/13/18 1641    Visit Number  17    Number of Visits  33    Date for SLP Re-Evaluation  09/25/18    Authorization Type  6/10 medicare progress note    SLP Start Time  1400    SLP Stop Time   1455    SLP Time Calculation (min)  55 min    Activity Tolerance  Patient tolerated treatment well       Past Medical History:  Diagnosis Date  . Arthritis   . Fibrocystic breast disease   . GERD (gastroesophageal reflux disease)   . Hyperlipidemia   . Hypertension     Past Surgical History:  Procedure Laterality Date  . APPENDECTOMY    . BREAST BIOPSY    . BREAST EXCISIONAL BIOPSY Bilateral [id number]   benign  . EYE SURGERY    . TEE WITHOUT CARDIOVERSION N/A 04/21/2018   Procedure: TRANSESOPHAGEAL ECHOCARDIOGRAM (TEE);  Surgeon: Corey Skains, MD;  Location: [facility] ORS;  Service: Cardiovascular;  Laterality: N/A;    There were no vitals filed for this visit.  Subjective Assessment - 08/13/18 1639    Subjective  Patient reported she is doing well but is tired and ready to sit down after her physical therapy session.            ADULT SLP TREATMENT - 08/13/18 0001      General Information   Behavior/Cognition  Alert;Cooperative;Pleasant mood    Patient Positioning  Upright in chair    HPI  Per discharge summary from [facility]: Ischemic Stroke s/p alteplase and IA:  Presented initially with symptoms on arrival notable for left gaze deviation, right sided hemiparesis, hemianopia, and right facial droop. Primary stroke risk factors: hypertension. She was found to be a candidate for tPa that she received on 5/15. She was  also a candidate for IA and brought in for successful mechanical thrombectomy with good restoration of blood flow after one pass, TICI 3. Patient monitored in the ICU initially and required intubation due to frequent vomiting and concern for airway protection. She was successfully extubated on 5/16 and transferred to the floor without issues.  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       Treatment Provided   Treatment provided  Cognitive-Linquistic      Pain Assessment   Pain Assessment  No/denies pain      Cognitive-Linquistic Treatment   Treatment focused on  Aphasia    Skilled Treatment Patient named 5 semantic features to enable SLP to guess name of given pictured objects with 100% accuracy given visual cues via semantic feature analysis chart and min to mod verbal cues to facilitate successful use of chart (75% accuracy independently.)  Patient described similarities and differences of 2 target words with 100% accuracy given min to mod verbal cues, 70% accuracy independently.     Assessment / Recommendations / Plan   Plan  Continue with current plan of care      Progression Toward Goals   Progression toward goals  Progressing toward goals       SLP Education -  08/13/18 1641    Education Details  Compensatory strategies for word finding to aid verbal and written communication, visual aids to increase the size and accuracy of her writing.    Person(s) Educated  Patient    Methods  Explanation;Demonstration;Verbal cues;Handout    Comprehension  Verbalized understanding;Need further instruction;Returned demonstration;Verbal cues required         SLP Long Term Goals - 07/30/18 1408      SLP LONG TERM GOAL #1   Title  Patient will describe pictured objects by naming 3-5 semantic features independently.    Baseline  Previous goal met: Patient will describe pictured objects by naming 3 semantic features with  80% accuracy given min verbal cues. (met 07/30/18)    Time  8    Period  Weeks    Status  Revised      SLP LONG TERM GOAL #2   Title  Patient will name 10 members in a given category with 80% accuracy independently.    Baseline  Previous goal met: Patient will name 10 members in a given category with 80% accuracy given min verbal cues. ( met 07/30/18)    Time  16    Period  Weeks    Status  Revised      SLP LONG TERM GOAL #3   Title  Patient will compose a novel sentence when given 1-2 target words or when given a picture with 80% accuracy independently.    Baseline  Previous goal partially met: Patient will compose a novel sentence when given 1-2 target words or when given a picture with 80% accuracy independently. (revised 07/30/18)    Time  16    Period  Weeks    Status  Revised      SLP LONG TERM GOAL #4   Title  Patient will name semantic features/descriptors when having word finding difficulty in conversation with 80% accuracy independently.    Baseline  Previous goal: Patient will name semantic features/descriptors when having word finding difficulty in conversation with 100% accuracy given min verbal cues., revised 3/72/90 for recert    Time  16    Period  Weeks    Status  Revised      SLP LONG TERM GOAL #5   Title  Patient will be independent with resonant voice exercises.    Time  8    Period  Weeks    Status  Deferred       Plan - 08/13/18 1642    Clinical Impression Statement  Patient demonstrated accurate and effective use of semantic feature analysis chart given min to mod verbal cues. Continues to demonstrate emerging independence naming semantic features to aid word finding in conversation.   Speech Therapy Frequency  2x / week    Duration  Other (comment)   8 weeks   Treatment/Interventions  Compensatory strategies;SLP instruction and feedback;Patient/family education;Functional tasks    Potential to Achieve Goals  Good    Potential Considerations  Ability to  learn/carryover information;Family/community support;Cooperation/participation level    SLP Home Exercise Plan  Word finding exercises, home practice of compensatory strategies for word finding, copying a list of words on lined paper to guide size of lettering    Consulted and Agree with Plan of Care  Patient       Patient will benefit from skilled therapeutic intervention in order to improve the following deficits and impairments:   Aphasia    Problem List There are no active problems to display for this patient.  Rulon Eisenmenger, MA, CCC-SLP 08/13/2018, 4:48 PM  [place] MAIN [facility] SERVICES [address] [place], [place], [address] Phone: [phone]   Fax:  [phone]   Name: Adriana Hughes MRN: [id number] Date of Birth: 1941/10/13

## 2018-08-17 ENCOUNTER — Encounter: Payer: Self-pay | Admitting: Physical Therapy

## 2018-08-17 ENCOUNTER — Encounter: Payer: Self-pay | Admitting: Speech Pathology

## 2018-08-17 ENCOUNTER — Ambulatory Visit: Payer: Medicare Other | Admitting: Speech Pathology

## 2018-08-17 ENCOUNTER — Ambulatory Visit: Payer: Medicare Other | Admitting: Physical Therapy

## 2018-08-17 DIAGNOSIS — R4701 Aphasia: Secondary | ICD-10-CM

## 2018-08-17 DIAGNOSIS — M6281 Muscle weakness (generalized): Secondary | ICD-10-CM

## 2018-08-17 DIAGNOSIS — R262 Difficulty in walking, not elsewhere classified: Secondary | ICD-10-CM

## 2018-08-17 NOTE — Therapy (Signed)
[place] [facility] MAIN [facility] SERVICES [address] [place], [place], [address] Phone: [phone]   Fax:  [phone]  Physical Therapy Treatment  Patient Details  Name: Adriana Hughes MRN: [id number] Date of Birth: 06-03-1941 Referring Provider (PT): Clearence Cheek    Encounter Date: 08/17/2018  PT End of Session - 08/17/18 1310    Visit Number  27    Number of Visits  41    Date for PT Re-Evaluation  09/07/18    Authorization Type  8/10    PT Start Time  0105    PT Stop Time  0145    PT Time Calculation (min)  40 min    Equipment Utilized During Treatment  Gait belt    Activity Tolerance  Patient tolerated treatment well;Patient limited by fatigue    Behavior During Therapy  [facility] for tasks assessed/performed       Past Medical History:  Diagnosis Date  . Arthritis   . Fibrocystic breast disease   . GERD (gastroesophageal reflux disease)   . Hyperlipidemia   . Hypertension     Past Surgical History:  Procedure Laterality Date  . APPENDECTOMY    . BREAST BIOPSY    . BREAST EXCISIONAL BIOPSY Bilateral [id number]   benign  . EYE SURGERY    . TEE WITHOUT CARDIOVERSION N/A 04/21/2018   Procedure: TRANSESOPHAGEAL ECHOCARDIOGRAM (TEE);  Surgeon: Lamar Blinks, MD;  Location: [facility] ORS;  Service: Cardiovascular;  Laterality: N/A;    There were no vitals filed for this visit.  Subjective Assessment - 08/17/18 1308    Subjective  Pt reports that she is doing well today. She denies pain currently and has no specific questions or concerns. She denies recent falls or recent changes in her health. HEP is going well.     Patient is accompained by:  Family member    Pertinent History  Per discharge summary from [facility]: Ischemic Stroke s/p alteplase and IA:  Presented initially with symptoms on arrival notable for left gaze deviation, right sided hemiparesis, hemianopia, and right facial droop. Primary stroke risk factors: hypertension. She was found  to be a candidate for tPa that she received on 5/15. She was also a candidate for IA and brought in for successful mechanical thrombectomy with good restoration of blood flow after one pass, TICI 3. Patient monitored in the ICU initially and required intubation due to frequent vomiting and concern for airway protection. She was successfully extubated on 5/16 and transferred to the floor without issues. Risk stratification obtained while inpatient as follows: LDL: 126, A1C: 5.8, TTE reveals EF >55% without evidence of intracardiac thrombus. 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 Post tPA [facility] 5/15 showed interval left basal ganglia hyperdensity, so repeat CTH ordered on 5/16 showing resolution of finding which likely represented contrast staining from IA procedure     How long can you stand comfortably?  no limits    Patient Stated Goals  Patient wants to be able to walk longer distances and not walk towards the right side.     Currently in Pain?  No/denies    Multiple Pain Sites  No       Treatment:  TM .1.2 miles / hour  Matrix 22. 5 lbs x 3 laps fwd/bwd, side to side   Side stepping on TM . 4 miles / hoiur left and right   Step up from foam for SLS  3 sec hold x5 reps each LE, unsupported; CGA for safety with cues to improve trunk control for better static balance;  Side step up and over 4 inch step with airex pad, X 10 25 % need of UE for support  Bosu ball lunges x 20 x 2  Leg press 90 lbs x 20 x 2; heel raises 60 lbs x 20 x 2   CGA and Min verbal cues used throughout with increased in postural sway and LOB most seen with narrow base of support and while on uneven surfaces. Continues to have balance deficits typical with diagnosis. Patient performs intermediate level exercises without pain behaviors and needs verbal cuing for postural alignment and head positioning                           PT Education - 08/17/18 1308    Education Details  HEP    Person(s) Educated  Patient    Methods  Explanation    Comprehension  Verbalized understanding;Returned demonstration;Need further instruction       PT Short Term Goals - 06/16/18 1610      PT SHORT TERM GOAL #1   Title  Patient will be independent in home exercise program to improve strength/mobility for better functional independence with ADLs.    Time  6    Period  Weeks    Status  New    Target Date  07/28/18      PT SHORT TERM GOAL #2   Title  Patient will increase six minute walk test distance to >1000 for progression to community ambulator and improve gait ability    Baseline  585 ft    Time  6    Period  Weeks    Status  New    Target Date  07/28/18        PT Long Term Goals - 07/20/18 1528      PT LONG TERM GOAL #1   Title  Patient will increase 10 meter walk test to >1.23m/s as to improve gait speed for better community ambulation and to reduce fall risk.    Baseline  07/20/18= .95 m/sec    Time  12    Period  Weeks    Status  On-going    Target Date  09/07/18      PT LONG TERM GOAL #2   Title  Patient will ascend/descend 4 stairs without rail assist independently without loss of balance to improve ability to get in/out of home.     Baseline  , needs rail 07/20/18    Time  12    Period  Weeks    Status  On-going    Target Date  09/07/18      PT LONG TERM GOAL #3   Title   Patient will demonstrate an improved Berg Balance Score of > as to demonstrate improved balance with ADLs such as sitting/standing and transfer balance and reduced fall risk    Baseline  49/56 eval, 52/56 07/20/18    Time  12    Period  Weeks    Status  On-going    Target Date  09/07/18      PT LONG TERM GOAL #4   Title  Patient will increase BLE gross strength to 4+/5 as to improve functional strength for independent gait, increased standing tolerance and increased ADL ability.     Baseline  4/5 BLE 07/20/18    Time  12  Period  Weeks    Status  On-going    Target Date  09/07/18            Plan - 08/17/18 1320    Clinical Impression Statement  Pt presents with unsteadiness on uneven surfaces and fatigues with therapeutic exercises. Patient needs assist with beginning moderate balance activities and needs CGA assist with dynamic standing activities. Patient demonstrates difficulty with dynamic standing balance and with narrow  base of support and increased challenges for LE.  Patient tolerated all interventions well this date and skilled PT will continue to improve mobility and strength    Rehab Potential  Good    PT Frequency  2x / week    PT Duration  12 weeks    PT Treatment/Interventions  Manual techniques;Patient/family education;Balance training;Functional mobility training;Therapeutic activities;Therapeutic exercise;Gait training;Aquatic Therapy       Patient will benefit from skilled therapeutic intervention in order to improve the following deficits and impairments:  Abnormal gait, Decreased balance, Decreased endurance, Decreased mobility, Difficulty walking, Decreased activity tolerance, Decreased safety awareness, Decreased strength  Visit Diagnosis: Difficulty in walking, not elsewhere classified  Muscle weakness (generalized)     Problem List There are no active problems to display for this patient.   [address], [place] DPT 08/17/2018, 1:27 PM  [place] [facility] MAIN [facility] SERVICES [address] [place], [place], [address] Phone: [phone]   Fax:  [phone]  Name: Adriana Hughes MRN: [id number] Date of Birth: 1941/08/16

## 2018-08-17 NOTE — Therapy (Signed)
[place] MAIN [facility] [address] [place], [place], [address] Phone: [phone]   Fax:  [phone]  Speech Language Pathology Treatment  Patient Details  Name: Adriana Hughes MRN: [id number] Date of Birth: 24-Jun-1941 Referring Provider (SLP): [name], NP   Encounter Date: 08/17/2018  End of Session - 08/17/18 1455    Visit Number  18    Number of Visits  33    Date for SLP Re-Evaluation  09/25/18    Authorization Type  7/10 medicare progress note    SLP Start Time  1400    SLP Stop Time   1447    SLP Time Calculation (min)  47 min    Activity Tolerance  Patient tolerated treatment well       Past Medical History:  Diagnosis Date  . Arthritis   . Fibrocystic breast disease   . GERD (gastroesophageal reflux disease)   . Hyperlipidemia   . Hypertension     Past Surgical History:  Procedure Laterality Date  . APPENDECTOMY    . BREAST BIOPSY    . BREAST EXCISIONAL BIOPSY Bilateral 3220,2542   benign  . EYE SURGERY    . TEE WITHOUT CARDIOVERSION N/A 04/21/2018   Procedure: TRANSESOPHAGEAL ECHOCARDIOGRAM (TEE);  Surgeon: Corey Skains, MD;  Location: [facility] ORS;  Service: Cardiovascular;  Laterality: N/A;    There were no vitals filed for this visit.  Subjective Assessment - 08/17/18 1454    Subjective  Patient reported she had a good weekend celebrating her birthday.    Currently in Pain?  No/denies            ADULT SLP TREATMENT - 08/17/18 0001      General Information   Behavior/Cognition  Alert;Cooperative;Pleasant mood    Patient Positioning  Upright in chair    HPI  Per discharge summary from [facility]: Ischemic Stroke s/p alteplase and IA:  Presented initially with symptoms on arrival notable for left gaze deviation, right sided hemiparesis, hemianopia, and right facial droop. Primary stroke risk factors: hypertension. She was found to be a candidate for tPa that she received on 5/15. She was also a  candidate for IA and brought in for successful mechanical thrombectomy with good restoration of blood flow after one pass, TICI 3. Patient monitored in the ICU initially and required intubation due to frequent vomiting and concern for airway protection. She was successfully extubated on 5/16 and transferred to the floor without issues.CT Head revealed left MCA infarct without hemorrhage. 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       Treatment Provided   Treatment provided  Cognitive-Linquistic      Pain Assessment   Pain Assessment  No/denies pain      Cognitive-Linquistic Treatment   Treatment focused on  Aphasia    Skilled Treatment  Patient named 3 semantic features to enable SLP to guess name of given pictured objects with 100% accuracy given visual cues via semantic feature analysis chart and min to mod verbal cues to facilitate successful use of chart (60% accuracy independently.)  Patient named 5 items in a named category with 100% accuracy independently and 10 items given min to mod verbal cues.  Patient named semantic features to aid word finding difficulty in conversation 2/3 times with success given min verbal cues to use SFA.     Assessment / Recommendations / Plan   Plan  Continue with current plan of care  Progression Toward Goals   Progression toward goals  Progressing toward goals       SLP Education - 08/17/18 1454    Education Details  Compensatory strategies for word finding to aid verbal and written communication    Person(s) Educated  Patient    Methods  Explanation;Demonstration;Verbal cues    Comprehension  Verbalized understanding;Need further instruction;Returned demonstration;Verbal cues required         SLP Long Term Goals - 07/30/18 1408      SLP LONG TERM GOAL #1   Title  Patient will describe pictured objects by naming 3-5 semantic features independently.    Baseline  Previous goal met: Patient will describe pictured objects  by naming 3 semantic features with 80% accuracy given min verbal cues. (met 07/30/18)    Time  8    Period  Weeks    Status  Revised      SLP LONG TERM GOAL #2   Title  Patient will name 10 members in a given category with 80% accuracy independently.    Baseline  Previous goal met: Patient will name 10 members in a given category with 80% accuracy given min verbal cues. ( met 07/30/18)    Time  16    Period  Weeks    Status  Revised      SLP LONG TERM GOAL #3   Title  Patient will compose a novel sentence when given 1-2 target words or when given a picture with 80% accuracy independently.    Baseline  Previous goal partially met: Patient will compose a novel sentence when given 1-2 target words or when given a picture with 80% accuracy independently. (revised 07/30/18)    Time  16    Period  Weeks    Status  Revised      SLP LONG TERM GOAL #4   Title  Patient will name semantic features/descriptors when having word finding difficulty in conversation with 80% accuracy independently.    Baseline  Previous goal: Patient will name semantic features/descriptors when having word finding difficulty in conversation with 100% accuracy given min verbal cues., revised 0/76/80 for recert    Time  16    Period  Weeks    Status  Revised      SLP LONG TERM GOAL #5   Title  Patient will be independent with resonant voice exercises.    Time  8    Period  Weeks    Status  Deferred       Plan - 08/17/18 1456    Clinical Impression Statement  Patient named semantic features of pictured objects with increased speed and variety (ie named category, descriptive features, function, associations). Category naming continues to be difficult, reliant on SLP for cues to generate >4-5 items per category named.   Speech Therapy Frequency  2x / week    Duration  Other (comment)   8 weeks   Treatment/Interventions  Compensatory strategies;SLP instruction and feedback;Patient/family education;Functional tasks     Potential to Achieve Goals  Good    Potential Considerations  Ability to learn/carryover information;Family/community support;Cooperation/participation level    SLP Home Exercise Plan  Word finding exercises, home practice of compensatory strategies for word finding, copying a list of words on lined paper to guide size of lettering    Consulted and Agree with Plan of Care  Patient       Patient will benefit from skilled therapeutic intervention in order to improve the following deficits and impairments:  Aphasia    Problem List There are no active problems to display for this patient.   [place], MA, CCC-SLP 08/17/2018, 2:57 PM  [place] MAIN [facility] [address] [place], [place], [address] Phone: [phone]   Fax:  [phone]   Name: Adriana Hughes MRN: [id number] Date of Birth: Mar 31, 1941

## 2018-08-19 ENCOUNTER — Ambulatory Visit: Payer: Medicare Other | Admitting: Physical Therapy

## 2018-08-19 ENCOUNTER — Encounter: Payer: Medicare Other | Admitting: Speech Pathology

## 2018-08-20 ENCOUNTER — Encounter: Payer: Self-pay | Admitting: Physical Therapy

## 2018-08-20 ENCOUNTER — Ambulatory Visit: Payer: Medicare Other | Admitting: Physical Therapy

## 2018-08-20 ENCOUNTER — Encounter: Payer: Self-pay | Admitting: Speech Pathology

## 2018-08-20 ENCOUNTER — Ambulatory Visit: Payer: Medicare Other | Admitting: Speech Pathology

## 2018-08-20 ENCOUNTER — Other Ambulatory Visit: Payer: Self-pay | Admitting: Family Medicine

## 2018-08-20 DIAGNOSIS — R262 Difficulty in walking, not elsewhere classified: Secondary | ICD-10-CM

## 2018-08-20 DIAGNOSIS — M6281 Muscle weakness (generalized): Secondary | ICD-10-CM

## 2018-08-20 DIAGNOSIS — R4701 Aphasia: Secondary | ICD-10-CM | POA: Diagnosis not present

## 2018-08-20 DIAGNOSIS — Z1231 Encounter for screening mammogram for malignant neoplasm of breast: Secondary | ICD-10-CM

## 2018-08-20 NOTE — Therapy (Signed)
[place] MAIN [facility] SERVICES [address] [place], [place], [address] Phone: [phone]   Fax:  [phone]  Speech Language Pathology Treatment  Patient Details  Name: Adriana Hughes MRN: [id number] Date of Birth: 01/29/1941 Referring Provider (SLP): [name], NP   Encounter Date: 08/20/2018  End of Session - 08/20/18 1357    Visit Number  19    Number of Visits  33    Date for SLP Re-Evaluation  09/25/18    Authorization Type  8/10 medicare progress note    SLP Start Time  1305    SLP Stop Time   1351    SLP Time Calculation (min)  46 min    Activity Tolerance  Patient tolerated treatment well       Past Medical History:  Diagnosis Date  . Arthritis   . Fibrocystic breast disease   . GERD (gastroesophageal reflux disease)   . Hyperlipidemia   . Hypertension     Past Surgical History:  Procedure Laterality Date  . APPENDECTOMY    . BREAST BIOPSY    . BREAST EXCISIONAL BIOPSY Bilateral 4825,0037   benign  . EYE SURGERY    . TEE WITHOUT CARDIOVERSION N/A 04/21/2018   Procedure: TRANSESOPHAGEAL ECHOCARDIOGRAM (TEE);  Surgeon: Corey Skains, MD;  Location: [facility] ORS;  Service: Cardiovascular;  Laterality: N/A;    There were no vitals filed for this visit.  Subjective Assessment - 08/20/18 1355    Subjective  Patient reported she is doing well today, smiled and joked often throughout treatment.    Currently in Pain?  No/denies            ADULT SLP TREATMENT - 08/20/18 0001      General Information   Behavior/Cognition  Alert;Cooperative;Pleasant mood    Patient Positioning  Upright in chair    HPI  Per discharge summary from [facility]: Ischemic Stroke s/p alteplase and IA:  Presented initially with symptoms on arrival notable for left gaze deviation, right sided hemiparesis, hemianopia, and right facial droop. Primary stroke risk factors: hypertension. She was found to be a candidate for tPa that she received on  5/15. She was also a candidate for IA and brought in for successful mechanical thrombectomy with good restoration of blood flow after one pass, TICI 3. Patient monitored in the ICU initially and required intubation due to frequent vomiting and concern for airway protection. She was successfully extubated on 5/16 and transferred to the floor without issues. . 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       Treatment Provided   Treatment provided  Cognitive-Linquistic      Pain Assessment   Pain Assessment  No/denies pain      Cognitive-Linquistic Treatment   Treatment focused on  Aphasia    Skilled Treatment  Patient named 3 semantic features to enable SLP to guess name of given pictured objects with 100% accuracy independently and named 4-5 semantic features with 100% accuracy given min verbal cues to facilitate successful use of semantic feature analysis chart (70% accuracy independently.)      Assessment / Recommendations / Plan   Plan  Continue with current plan of care      Progression Toward Goals   Progression toward goals  Progressing toward goals       SLP Education - 08/20/18 1356    Education Details  Compensatory strategies for word finding to aid  verbal and written communication    Person(s) Educated  Patient    Methods  Explanation;Demonstration;Verbal cues;Handout    Comprehension  Verbalized understanding;Need further instruction;Returned demonstration;Verbal cues required         SLP Long Term Goals - 07/30/18 1408      SLP LONG TERM GOAL #1   Title  Patient will describe pictured objects by naming 3-5 semantic features independently.    Baseline  Previous goal met: Patient will describe pictured objects by naming 3 semantic features with 80% accuracy given min verbal cues. (met 07/30/18)    Time  8    Period  Weeks    Status  Revised      SLP LONG TERM GOAL #2   Title  Patient will name 10  members in a given category with 80% accuracy independently.    Baseline  Previous goal met: Patient will name 10 members in a given category with 80% accuracy given min verbal cues. ( met 07/30/18)    Time  16    Period  Weeks    Status  Revised      SLP LONG TERM GOAL #3   Title  Patient will compose a novel sentence when given 1-2 target words or when given a picture with 80% accuracy independently.    Baseline  Previous goal partially met: Patient will compose a novel sentence when given 1-2 target words or when given a picture with 80% accuracy independently. (revised 07/30/18)    Time  16    Period  Weeks    Status  Revised      SLP LONG TERM GOAL #4   Title  Patient will name semantic features/descriptors when having word finding difficulty in conversation with 80% accuracy independently.    Baseline  Previous goal: Patient will name semantic features/descriptors when having word finding difficulty in conversation with 100% accuracy given min verbal cues., revised 02/26/94 for recert    Time  16    Period  Weeks    Status  Revised      SLP LONG TERM GOAL #5   Title  Patient will be independent with resonant voice exercises.    Time  8    Period  Weeks    Status  Deferred       Plan - 08/20/18 1357    Clinical Impression Statement  Patient demonstrated improved speed, fluency and accuracy when naming semantic features to aid word finding as well as improved identification and self correction of paraphasias. Patient reports decreased difficulty with word finding at home.   Speech Therapy Frequency  2x / week    Duration  Other (comment)   8 weeks   Treatment/Interventions  Compensatory strategies;SLP instruction and feedback;Patient/family education;Functional tasks    Potential to Achieve Goals  Good    Potential Considerations  Ability to learn/carryover information;Family/community support;Cooperation/participation level    SLP Home Exercise Plan  Word finding exercises, home  practice of compensatory strategies for word finding, copying a list of words on lined paper to guide size of lettering    Consulted and Agree with Plan of Care  Patient       Patient will benefit from skilled therapeutic intervention in order to improve the following deficits and impairments:   Aphasia    Problem List There are no active problems to display for this patient.   Peyton Spengler, MA, CCC-SLP 08/20/2018, 2:07 PM  [place] [facility] [place],  [place], [address] Phone: [phone]   Fax:  [phone]   Name: Adriana Hughes MRN: [id number] Date of Birth: 02-10-1941

## 2018-08-20 NOTE — Therapy (Signed)
[facility] MAIN [facility] SERVICES [address] [place], [place], [address] Phone: [phone]   Fax:  [phone]  Physical Therapy Treatment  Patient Details  Name: Adriana Hughes MRN: [id number] Date of Birth: [birth year] Referring Provider (PT): Clearence Cheek    Encounter Date: 08/20/2018  PT End of Session - 08/20/18 1401    Visit Number  28    Number of Visits  41    Date for PT Re-Evaluation  09/07/18    Authorization Type  9/10    PT Start Time  1401    PT Stop Time  1445    PT Time Calculation (min)  44 min    Equipment Utilized During Treatment  Gait belt    Activity Tolerance  Patient tolerated treatment well;Patient limited by fatigue    Behavior During Therapy  [facility] for tasks assessed/performed       Past Medical History:  Diagnosis Date  . Arthritis   . Fibrocystic breast disease   . GERD (gastroesophageal reflux disease)   . Hyperlipidemia   . Hypertension     Past Surgical History:  Procedure Laterality Date  . APPENDECTOMY    . BREAST BIOPSY    . BREAST EXCISIONAL BIOPSY Bilateral 6962,9528   benign  . EYE SURGERY    . TEE WITHOUT CARDIOVERSION N/A 04/21/2018   Procedure: TRANSESOPHAGEAL ECHOCARDIOGRAM (TEE);  Surgeon: Lamar Blinks, MD;  Location: [facility] ORS;  Service: Cardiovascular;  Laterality: N/A;    There were no vitals filed for this visit.  Subjective Assessment - 08/20/18 1358    Subjective  Patient reports doing well; Denies any pain; Denies any new changes;     Patient is accompained by:  Family member    Pertinent History  Per discharge summary from [facility]: Ischemic Stroke s/p alteplase and IA:  Presented initially with symptoms on arrival notable for left gaze deviation, right sided hemiparesis, hemianopia, and right facial droop. Primary stroke risk factors: hypertension. She was found to be a candidate for tPa that she received on 5/15. She was also a candidate for IA and brought in for successful  mechanical thrombectomy with good restoration of blood flow after one pass, TICI 3. Patient monitored in the ICU initially and required intubation due to frequent vomiting and concern for airway protection. She was successfully extubated on 5/16 and transferred to the floor without issues. Risk stratification obtained while inpatient as follows: LDL: 126, A1C: 5.8, TTE reveals EF >55% without evidence of intracardiac thrombus. 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 Post tPA [facility] 5/15 showed interval left basal ganglia hyperdensity, so repeat CTH ordered on 5/16 showing resolution of finding which likely represented contrast staining from IA procedure     How long can you stand comfortably?  no limits    Patient Stated Goals  Patient wants to be able to walk longer distances and not walk towards the right side.     Currently in Pain?  No/denies    Multiple Pain Sites  No              Treatment:  TM .1.2-1.5 mph with 2-1 HHA with cues to improve erect posture, increase step length for better gait safety; x5 min for warm up; Exhibits occasional right foot drag with cues to increase ankle DF at heel strike for better foot clearance;    Side stepping on TM . 4 miles / hoiur  left and right x2 min each with 2 HHA for safety; Required cues to increase step length for better sequencing and foot clearance;   Leg press, BLE heel raises 60 lbs x 20 x 2; required min VCs for correct positioning for better ankle strengthening;    Standing on airex pad with 2 # ankle weight: -hip flexion march x15 bilaterally; -hamstring curl x15 bilaterally; -mini squat unsupported x10 with cues for positioning to reduce knee discomfort;    Patient able to ascend/descend 4 steps without rail assist, forward reciprocally x2 sets, supervision with cues to increase speed for better safety   Patient instructed in advanced balance  exercise  Matrix 22. 5 lbs x 3 laps fwd/bwd, side to side (2 way) with CGA for safety and cues to increase step length and slow down eccentric return for better gait safety;   Standing in parallel bars:  Standing on airex foam: -alternate toe taps to 4 inch step with 2-1 rail assist x15 reps bilaterally; -Standing one foot on airex, one foot on 4 inch step, BUE ball pass side/side x5 reps each foot on step -modified tandem stance: head turns side/side, up/down x5 reps each, each foot in front; Patient required min VCs for balance stability, including to increase trunk control for less loss of balance with smaller base of support  Standing on 1/2 foam: (Flat side up) -heel/toe rocks with feet apart heel/toe rocks x10 with rail assist for safety -feet apart, BUE wand flexion x10 reps with CGA for safety and cues to improve ankle strategies for better stance control -tandem stance with 2-0 rail assist 10 sec hold x2 reps each foot in front with CGA to min A for safety and cues to improve erect posture and increase weight shift for better stance control                     PT Education - 08/20/18 1400    Education Details  HEP reinforced, strengthening/balance;     Person(s) Educated  Patient    Methods  Explanation;Demonstration;Verbal cues    Comprehension  Verbalized understanding;Returned demonstration;Verbal cues required;Need further instruction       PT Short Term Goals - 06/16/18 0808      PT SHORT TERM GOAL #1   Title  Patient will be independent in home exercise program to improve strength/mobility for better functional independence with ADLs.    Time  6    Period  Weeks    Status  New    Target Date  07/28/18      PT SHORT TERM GOAL #2   Title  Patient will increase six minute walk test distance to >1000 for progression to community ambulator and improve gait ability    Baseline  585 ft    Time  6    Period  Weeks    Status  New    Target Date   07/28/18        PT Long Term Goals - 07/20/18 1528      PT LONG TERM GOAL #1   Title  Patient will increase 10 meter walk test to >1.67m/s as to improve gait speed for better community ambulation and to reduce fall risk.    Baseline  07/20/18= .95 m/sec    Time  12    Period  Weeks    Status  On-going    Target Date  09/07/18      PT LONG TERM GOAL #2   Title  Patient will ascend/descend 4 stairs without rail assist independently without loss of balance to improve ability to get in/out of home.     Baseline  , needs rail 07/20/18    Time  12    Period  Weeks    Status  On-going    Target Date  09/07/18      PT LONG TERM GOAL #3   Title   Patient will demonstrate an improved Berg Balance Score of > as to demonstrate improved balance with ADLs such as sitting/standing and transfer balance and reduced fall risk    Baseline  49/56 eval, 52/56 07/20/18    Time  12    Period  Weeks    Status  On-going    Target Date  09/07/18      PT LONG TERM GOAL #4   Title  Patient will increase BLE gross strength to 4+/5 as to improve functional strength for independent gait, increased standing tolerance and increased ADL ability.    Baseline  4/5 BLE 07/20/18    Time  12    Period  Weeks    Status  On-going    Target Date  09/07/18            Plan - 08/20/18 1418    Clinical Impression Statement  Patient instructed in advanced LE strengthening, increasing resistance/repetition. She did report increased discomfort in left hip with leg press quad strenthening exericse, therefore exercise withheld. Patient tolerated other strengthening exercise well. Instructed patient in advanced balance activities. She did exhibit slightly less control in right ankle as compared to left. Patient able to keep balance with less rail assist today. She would benefit from additional skilled PT Intervention to improve strength, balance and gait safety;     Rehab Potential  Good    PT Frequency  2x / week    PT  Duration  12 weeks    PT Treatment/Interventions  Manual techniques;Patient/family education;Balance training;Functional mobility training;Therapeutic activities;Therapeutic exercise;Gait training;Aquatic Therapy       Patient will benefit from skilled therapeutic intervention in order to improve the following deficits and impairments:  Abnormal gait, Decreased balance, Decreased endurance, Decreased mobility, Difficulty walking, Decreased activity tolerance, Decreased safety awareness, Decreased strength  Visit Diagnosis: Difficulty in walking, not elsewhere classified  Muscle weakness (generalized)     Problem List There are no active problems to display for this patient.   Izsak Meir PT, DPT 08/20/2018, 2:53 PM  [place] [facility] MAIN [facility] SERVICES [address] [place], [place], [address] Phone: [phone]   Fax:  [phone]  Name: ADRIONNA DELCID MRN: [id number] Date of Birth: 05-11-1941

## 2018-08-24 ENCOUNTER — Encounter: Payer: Self-pay | Admitting: Speech Pathology

## 2018-08-24 ENCOUNTER — Encounter: Payer: Self-pay | Admitting: Physical Therapy

## 2018-08-24 ENCOUNTER — Ambulatory Visit: Payer: Medicare Other | Admitting: Speech Pathology

## 2018-08-24 ENCOUNTER — Ambulatory Visit: Payer: Medicare Other | Admitting: Physical Therapy

## 2018-08-24 DIAGNOSIS — R4701 Aphasia: Secondary | ICD-10-CM | POA: Diagnosis not present

## 2018-08-24 DIAGNOSIS — R262 Difficulty in walking, not elsewhere classified: Secondary | ICD-10-CM

## 2018-08-24 DIAGNOSIS — M6281 Muscle weakness (generalized): Secondary | ICD-10-CM

## 2018-08-24 NOTE — Therapy (Signed)
[facility] MAIN [facility] SERVICES [address] [place], [place], [address] Phone: [phone]   Fax:  [phone]  Physical Therapy Treatment/ Physical Therapy Progress Note   Dates of reporting period  07/20/09  to   08/24/18 Patient Details  Name: Adriana Hughes MRN: [id number] Date of Birth: 1941-08-17 Referring Provider (PT): Clearence Cheek    Encounter Date: 08/24/2018  PT End of Session - 08/24/18 1325    Visit Number  29    Number of Visits  41    Date for PT Re-Evaluation  09/07/18    Authorization Type  10/10    PT Start Time  0105    PT Stop Time  0145    PT Time Calculation (min)  40 min    Equipment Utilized During Treatment  Gait belt    Activity Tolerance  Patient tolerated treatment well;Patient limited by fatigue    Behavior During Therapy  WFL for tasks assessed/performed       Past Medical History:  Diagnosis Date  . Arthritis   . Fibrocystic breast disease   . GERD (gastroesophageal reflux disease)   . Hyperlipidemia   . Hypertension     Past Surgical History:  Procedure Laterality Date  . APPENDECTOMY    . BREAST BIOPSY    . BREAST EXCISIONAL BIOPSY Bilateral 5366,4403   benign  . EYE SURGERY    . TEE WITHOUT CARDIOVERSION N/A 04/21/2018   Procedure: TRANSESOPHAGEAL ECHOCARDIOGRAM (TEE);  Surgeon: Lamar Blinks, MD;  Location: [facility] ORS;  Service: Cardiovascular;  Laterality: N/A;    There were no vitals filed for this visit.  Subjective Assessment - 08/24/18 1324    Subjective  Patient reports doing well; Denies any pain; Denies any new changes;     Patient is accompained by:  Family member    Pertinent History  Per discharge summary from [facility]: Ischemic Stroke s/p alteplase and IA:  Presented initially with symptoms on arrival notable for left gaze deviation, right sided hemiparesis, hemianopia, and right facial droop. Primary stroke risk factors: hypertension. She was found to be a candidate for tPa that  she received on 5/15. She was also a candidate for IA and brought in for successful mechanical thrombectomy with good restoration of blood flow after one pass, TICI 3. Patient monitored in the ICU initially and required intubation due to frequent vomiting and concern for airway protection. She was successfully extubated on 5/16 and transferred to the floor without issues. Risk stratification obtained while inpatient as follows: LDL: 126, A1C: 5.8, TTE reveals EF >55% without evidence of intracardiac thrombus. 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 Post tPA [facility] 5/15 showed interval left basal ganglia hyperdensity, so repeat CTH ordered on 5/16 showing resolution of finding which likely represented contrast staining from IA procedure     How long can you stand comfortably?  no limits    Patient Stated Goals  Patient wants to be able to walk longer distances and not walk towards the right side.     Currently in Pain?  No/denies         TREATMENT Objective Measurements/Neuromuscular Re-education TUG:22   sec BERG: 54/56 10MW= 1.16   Therapeutic exercise: Bridges x 30 SLR 2x15, 3# ankle weight Patient requires VCs for controlled movements.                       PT  Education - 08/24/18 1325    Education Details  HEP     Person(s) Educated  Patient    Methods  Explanation    Comprehension  Verbalized understanding;Returned demonstration       PT Short Term Goals - 06/16/18 0808      PT SHORT TERM GOAL #1   Title  Patient will be independent in home exercise program to improve strength/mobility for better functional independence with ADLs.    Time  6    Period  Weeks    Status  New    Target Date  07/28/18      PT SHORT TERM GOAL #2   Title  Patient will increase six minute walk test distance to >1000 for progression to community ambulator and improve gait ability    Baseline  585 ft     Time  6    Period  Weeks    Status  New    Target Date  07/28/18        PT Long Term Goals - 08/24/18 1329      PT LONG TERM GOAL #1   Title  Patient will increase 10 meter walk test to >1.62m/s as to improve gait speed for better community ambulation and to reduce fall risk.    Baseline  07/20/18= .95 m/sec, 08/24/18= 1.16 m/sec    Time  12    Period  Weeks    Status  On-going    Target Date  09/07/18      PT LONG TERM GOAL #2   Title  Patient will ascend/descend 4 stairs without rail assist independently without loss of balance to improve ability to get in/out of home.     Baseline  , needs rail 07/20/18, 08/24/18 reciprical without railing    Time  12    Period  Weeks    Status  On-going    Target Date  09/07/18      PT LONG TERM GOAL #3   Title   Patient will demonstrate an improved Berg Balance Score of > as to demonstrate improved balance with ADLs such as sitting/standing and transfer balance and reduced fall risk    Baseline  49/56 eval, 52/56 07/20/18, 08/24/18  54/56 berg    Time  12    Period  Weeks    Status  On-going    Target Date  09/07/18      PT LONG TERM GOAL #4   Title  Patient will increase BLE gross strength to 4+/5 as to improve functional strength for independent gait, increased standing tolerance and increased ADL ability.    Baseline  4/5 BLE 07/20/18, 08/24/18 4/5 BLE flex, 3+/5 BLe hip abd     Time  12    Period  Weeks    Status  On-going    Target Date  09/07/18      PT LONG TERM GOAL #5   Title  Patient will reduce timed up and go to <11 seconds to reduce fall risk and demonstrate improved transfer/gait ability.    Baseline  08/24/18  22 sec    Time  8    Period  Weeks    Status  New    Target Date  09/07/18            Plan - 08/24/18 1326    Clinical Impression Statement Patient's condition has the potential to improve in response to therapy. Maximum improvement is yet to be obtained. The anticipated improvement is attainable and  reasonable in a generally predictable time.  Patient reports feeling better with her walking.  Patient demonstrates understanding of HEP with min corrections needed. Patient challenged with sit to stand transfer with multiple repetitions due to fatigue. Weak LE combined with weak core musculature results in poor postural control and balance deficits... Patient will continue to benefit from skilled physical therapy to improve balance and mobility.    Rehab Potential  Good    PT Frequency  2x / week    PT Duration  12 weeks    PT Treatment/Interventions  Manual techniques;Patient/family education;Balance training;Functional mobility training;Therapeutic activities;Therapeutic exercise;Gait training;Aquatic Therapy       Patient will benefit from skilled therapeutic intervention in order to improve the following deficits and impairments:  Abnormal gait, Decreased balance, Decreased endurance, Decreased mobility, Difficulty walking, Decreased activity tolerance, Decreased safety awareness, Decreased strength  Visit Diagnosis: Muscle weakness (generalized)  Difficulty in walking, not elsewhere classified     Problem List There are no active problems to display for this patient.   [address] [place], [place] 08/24/2018, 1:56 PM  [facility] MAIN [facility] SERVICES [address] [place], [place], [address] Phone: [phone]   Fax:  [phone]  Name: JALEENA VIVIANI MRN: [id number] Date of Birth: [birth year]

## 2018-08-24 NOTE — Therapy (Signed)
[place] [facility] MAIN REHAB SERVICES [address] [place], [place], [address] Phone: [phone]   Fax:  [phone]  Speech Language Pathology Treatment  Patient Details  Name: Adriana Hughes MRN: [id number] Date of Birth: 11/28/1940 Referring Provider (SLP): Susan Wilson, NP   Encounter Date: 08/24/2018  End of Session - 08/24/18 1634    Visit Number  20    Number of Visits  33    Date for SLP Re-Evaluation  09/25/18    Authorization Type  9/10 medicare progress note    SLP Start Time  1401    SLP Stop Time   1451    SLP Time Calculation (min)  50 min    Activity Tolerance  Patient tolerated treatment well       Past Medical History:  Diagnosis Date  . Arthritis   . Fibrocystic breast disease   . GERD (gastroesophageal reflux disease)   . Hyperlipidemia   . Hypertension     Past Surgical History:  Procedure Laterality Date  . APPENDECTOMY    . BREAST BIOPSY    . BREAST EXCISIONAL BIOPSY Bilateral 1991,1996   benign  . EYE SURGERY    . TEE WITHOUT CARDIOVERSION N/A 04/21/2018   Procedure: TRANSESOPHAGEAL ECHOCARDIOGRAM (TEE);  Surgeon: Kowalski, Bruce J, MD;  Location: [facility] ORS;  Service: Cardiovascular;  Laterality: N/A;    There were no vitals filed for this visit.  Subjective Assessment - 08/24/18 1633    Subjective  Patient reported she had good weekend.    Currently in Pain?  No/denies            ADULT SLP TREATMENT - 08/24/18 0001      General Information   Behavior/Cognition  Alert;Cooperative;Pleasant mood    Patient Positioning  Upright in chair    HPI  Per discharge summary from [facility]: Ischemic Stroke s/p alteplase and IA:  Presented initially with symptoms on arrival notable for left gaze deviation, right sided hemiparesis, hemianopia, and right facial droop. Primary stroke risk factors: hypertension. She was found to be a candidate for tPa that she received on 5/15. She was also a candidate for IA and brought  in for successful mechanical thrombectomy with good restoration of blood flow after one pass, TICI 3. Patient monitored in the ICU initially and required intubation due to frequent vomiting and concern for airway protection. She was successfully extubated on 5/16 and transferred to the floor without issues.  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       Treatment Provided   Treatment provided  Cognitive-Linquistic      Pain Assessment   Pain Assessment  No/denies pain      Cognitive-Linquistic Treatment   Treatment focused on  Aphasia    Skilled Treatment Patient named 3 semantic features to enable SLP to guess name of given pictured objects with 100% accuracy independently and named 4-5 semantic features with 100% accuracy given min verbal cues to facilitate successful use of semantic feature analysis chart (70% accuracy independently.) Noted continued improved fluency naming semantic features to aid word finding.  Patient named 5-7 items in a named category with 100% accuracy independently and 10 items given min to mod verbal cues.  Patient wrote a novel sentence given a target word describing semantic features with 83% accuracy given min to mod verbal cues.     Assessment / Recommendations / Plan   Plan  Continue   with current plan of care      Progression Toward Goals   Progression toward goals  Progressing toward goals       SLP Education - 08/24/18 1634    Education Details  Compensatory strategies for word finding to aid verbal and written communication    Person(s) Educated  Patient    Methods  Explanation;Demonstration;Verbal cues    Comprehension  Verbalized understanding;Need further instruction;Returned demonstration;Verbal cues required         SLP Long Term Goals - 07/30/18 1408      SLP LONG TERM GOAL #1   Title  Patient will describe pictured objects by naming 3-5 semantic features  independently.    Baseline  Previous goal met: Patient will describe pictured objects by naming 3 semantic features with 80% accuracy given min verbal cues. (met 07/30/18)    Time  8    Period  Weeks    Status  Revised      SLP LONG TERM GOAL #2   Title  Patient will name 10 members in a given category with 80% accuracy independently.    Baseline  Previous goal met: Patient will name 10 members in a given category with 80% accuracy given min verbal cues. ( met 07/30/18)    Time  16    Period  Weeks    Status  Revised      SLP LONG TERM GOAL #3   Title  Patient will compose a novel sentence when given 1-2 target words or when given a picture with 80% accuracy independently.    Baseline  Previous goal partially met: Patient will compose a novel sentence when given 1-2 target words or when given a picture with 80% accuracy independently. (revised 07/30/18)    Time  16    Period  Weeks    Status  Revised      SLP LONG TERM GOAL #4   Title  Patient will name semantic features/descriptors when having word finding difficulty in conversation with 80% accuracy independently.    Baseline  Previous goal: Patient will name semantic features/descriptors when having word finding difficulty in conversation with 100% accuracy given min verbal cues., revised 07/30/18 for recert    Time  16    Period  Weeks    Status  Revised      SLP LONG TERM GOAL #5   Title  Patient will be independent with resonant voice exercises.    Time  8    Period  Weeks    Status  Deferred       Plan - 08/24/18 1635    Clinical Impression Statement  Patient demonstrated continued improved fluency and emerging independence naming semantic features and mild improvement in independence with divergent naming tasks. Noted decreased time needed to compose and write grammatically correct sentences, still requiring cues to identify written phonemic paraphasias. However presence of paraphasias is noted to be markedly decreased in  patient's writing.   Speech Therapy Frequency  2x / week    Duration  Other (comment)   8 weeks   Treatment/Interventions  Compensatory strategies;SLP instruction and feedback;Patient/family education;Functional tasks    Potential to Achieve Goals  Good    Potential Considerations  Ability to learn/carryover information;Family/community support;Cooperation/participation level    SLP Home Exercise Plan  Word finding exercises, home practice of compensatory strategies for word finding, copying a list of words on lined paper to guide size of lettering    Consulted and Agree with Plan of Care    Patient       Patient will benefit from skilled therapeutic intervention in order to improve the following deficits and impairments:   Aphasia    Problem List There are no active problems to display for this patient.   [place], MA, CCC-SLP 08/24/2018, 4:39 PM  [place] MAIN [facility] SERVICES [address] [place], [place], [address] Phone: [phone]   Fax:  [phone]   Name: KERRILYNN DERENZO MRN: [id number] Date of Birth: 1941/06/07

## 2018-08-26 ENCOUNTER — Ambulatory Visit: Payer: Medicare Other | Admitting: Physical Therapy

## 2018-08-26 ENCOUNTER — Encounter: Payer: Medicare Other | Admitting: Speech Pathology

## 2018-08-27 ENCOUNTER — Encounter: Payer: Self-pay | Admitting: Physical Therapy

## 2018-08-27 ENCOUNTER — Ambulatory Visit: Payer: Medicare Other | Admitting: Physical Therapy

## 2018-08-27 ENCOUNTER — Ambulatory Visit: Payer: Medicare Other | Admitting: Speech Pathology

## 2018-08-27 ENCOUNTER — Encounter: Payer: Self-pay | Admitting: Speech Pathology

## 2018-08-27 DIAGNOSIS — R4701 Aphasia: Secondary | ICD-10-CM

## 2018-08-27 DIAGNOSIS — M6281 Muscle weakness (generalized): Secondary | ICD-10-CM

## 2018-08-27 DIAGNOSIS — R262 Difficulty in walking, not elsewhere classified: Secondary | ICD-10-CM

## 2018-08-27 NOTE — Therapy (Signed)
[place] MAIN [facility] SERVICES [address] [place], [place], [address] Phone: [phone]   Fax:  [phone]  Speech Language Pathology Progress Note/Treatment   Patient Details  Name: Adriana Hughes MRN: [id number] Date of Birth: Mar 11, 1941 Referring Provider (SLP): [name], NP   Encounter Date: 08/27/2018  End of Session - 08/27/18 1404    Visit Number  21    Number of Visits  33    Date for SLP Re-Evaluation  09/25/18    Authorization Type  10/10 medicare progress note    SLP Start Time  1300    SLP Stop Time   1353    SLP Time Calculation (min)  53 min    Activity Tolerance  Patient tolerated treatment well       Past Medical History:  Diagnosis Date  . Arthritis   . Fibrocystic breast disease   . GERD (gastroesophageal reflux disease)   . Hyperlipidemia   . Hypertension     Past Surgical History:  Procedure Laterality Date  . APPENDECTOMY    . BREAST BIOPSY    . BREAST EXCISIONAL BIOPSY Bilateral 0539,7673   benign  . EYE SURGERY    . TEE WITHOUT CARDIOVERSION N/A 04/21/2018   Procedure: TRANSESOPHAGEAL ECHOCARDIOGRAM (TEE);  Surgeon: Corey Skains, MD;  Location: [facility] ORS;  Service: Cardiovascular;  Laterality: N/A;    There were no vitals filed for this visit.  Subjective Assessment - 08/27/18 1401    Subjective  Patient reported she is doing well today and said she regrets forgetting to bring her home practice binder today.    Currently in Pain?  No/denies            ADULT SLP TREATMENT - 08/27/18 0001      General Information   Behavior/Cognition  Alert;Cooperative;Pleasant mood    Patient Positioning  Upright in chair    HPI  Per discharge summary from [facility]: Ischemic Stroke s/p alteplase and IA:  Presented initially with symptoms on arrival notable for left gaze deviation, right sided hemiparesis, hemianopia, and right facial droop. Primary stroke risk factors: hypertension. She was found to be  a candidate for tPa that she received on 5/15. She was also a candidate for IA and brought in for successful mechanical thrombectomy with good restoration of blood flow after one pass, TICI 3. Patient monitored in the ICU initially and required intubation due to frequent vomiting and concern for airway protection. She was successfully extubated on 5/16 and transferred to the floor without issues.  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       Treatment Provided   Treatment provided  Cognitive-Linquistic      Pain Assessment   Pain Assessment  No/denies pain      Cognitive-Linquistic Treatment   Treatment focused on  Aphasia    Skilled Treatment Patient named at least 4 semantic features to describe pictures with 90% accuracy independently.  Patient named 5+ semantic features given min verbal cues. Noted continued improved fluency naming semantic features as well as marked improved ability to identify and self-correct phonemic and semantic paraphasias.  Patient wrote a novel sentence given a picture describing semantic features for each with 100% accuracy independently (self-correct x2).     Assessment / Recommendations / Plan   Plan  Continue with current plan of care      Progression Toward Goals   Progression toward goals  Progressing toward goals       SLP Education - 08/27/18 1404    Education Details  Compensatory strategies for word finding to aid verbal and written communication    Person(s) Educated  Patient    Methods  Explanation;Demonstration;Verbal cues    Comprehension  Verbalized understanding;Returned demonstration;Need further instruction;Verbal cues required         SLP Long Term Goals - 08/27/18 1408      SLP LONG TERM GOAL #1   Title  Patient will describe pictured objects by naming 3-5 semantic features independently with 80% accuracy.    Baseline  Patient named 4 semantic features  independently with 90% accuracy x1 session    Time  8    Period  Weeks    Status  Partially Met      SLP LONG TERM GOAL #2   Title  Patient will name 10 members in a given category with 80% accuracy independently.    Baseline  Patient continues to require min to mod verbal cues to name 10 items in a given category, independently =40-60% accuracy    Time  8    Period  Weeks    Status  On-going      SLP LONG TERM GOAL #3   Title  Patient will compose a novel sentence when given 1-2 target words or when given a picture with 80% accuracy independently.    Baseline  Patient composes a novel sentence when given a picture with 90% independently, when given 1 target word with 90% with min cues.    Time  8    Period  Weeks    Status  Partially Met      SLP LONG TERM GOAL #4   Title  Patient will name semantic features/descriptors when having word finding difficulty in conversation with 80% accuracy independently.    Baseline  Requires min verbal cues    Time  8    Period  Weeks    Status  On-going       Plan - 08/27/18 1405    Clinical Impression Statement  Patient demonstrated improved ability to self identify and self correct verbal and written phonemic and semantic paraphasias during concrete treatment activities as well as in conversation. Patient near to meeting treatment goals, likely d/c with home practice program in 1-2 weeks. Patient continues to require skilled ST services to improve independence & consistency using compensatory strategies for word finding at the conversational level to functionally and efficiently express needs & wants.   Speech Therapy Frequency  2x / week    Duration  Other (comment)   8 weeks   Treatment/Interventions  Compensatory strategies;SLP instruction and feedback;Patient/family education;Functional tasks    Potential to Achieve Goals  Good    Potential Considerations  Ability to learn/carryover information;Family/community  support;Cooperation/participation level    SLP Home Exercise Plan  Word finding exercises, written and verbal    Consulted and Agree with Plan of Care  Patient       Patient will benefit from skilled therapeutic intervention in order to improve the following deficits and impairments:   Aphasia    Problem List There are no active problems to display for this patient.   [place], MA, CCC-SLP 08/27/2018, 2:26 PM  [place] MAIN [facility] SERVICES [address] [place] [address] [place], [place], [address] Phone: [phone]   Fax:  [phone]   Name: LOREN VICENS MRN: [id number] Date of Birth: Jun 08, 1941

## 2018-08-27 NOTE — Therapy (Signed)
[place] [facility] MAIN [facility] SERVICES [address] [place], [place], [address] Phone: [phone]   Fax:  [phone]  Physical Therapy Treatment  Patient Details  Name: Adriana Hughes MRN: [id number] Date of Birth: October 28, 1941 Referring Provider (PT): Clearence Cheek    Encounter Date: 08/27/2018  PT End of Session - 08/27/18 1359    Visit Number  30    Number of Visits  41    Date for PT Re-Evaluation  09/07/18    Authorization Type  1/10    PT Start Time  0250    PT Stop Time  0330    PT Time Calculation (min)  40 min    Equipment Utilized During Treatment  Gait belt    Activity Tolerance  Patient tolerated treatment well;Patient limited by fatigue    Behavior During Therapy  [facility] for tasks assessed/performed       Past Medical History:  Diagnosis Date  . Arthritis   . Fibrocystic breast disease   . GERD (gastroesophageal reflux disease)   . Hyperlipidemia   . Hypertension     Past Surgical History:  Procedure Laterality Date  . APPENDECTOMY    . BREAST BIOPSY    . BREAST EXCISIONAL BIOPSY Bilateral [id number]   benign  . EYE SURGERY    . TEE WITHOUT CARDIOVERSION N/A 04/21/2018   Procedure: TRANSESOPHAGEAL ECHOCARDIOGRAM (TEE);  Surgeon: Lamar Blinks, MD;  Location: [facility] ORS;  Service: Cardiovascular;  Laterality: N/A;    There were no vitals filed for this visit.  Subjective Assessment - 08/27/18 1358    Subjective  Patient reports doing well; Denies any pain; Denies any new changes;     Patient is accompained by:  Family member    Pertinent History  Per discharge summary from [facility]: Ischemic Stroke s/p alteplase and IA:  Presented initially with symptoms on arrival notable for left gaze deviation, right sided hemiparesis, hemianopia, and right facial droop. Primary stroke risk factors: hypertension. She was found to be a candidate for tPa that she received on 5/15. She was also a candidate for IA and brought in for successful  mechanical thrombectomy with good restoration of blood flow after one pass, TICI 3. Patient monitored in the ICU initially and required intubation due to frequent vomiting and concern for airway protection. She was successfully extubated on 5/16 and transferred to the floor without issues. Risk stratification obtained while inpatient as follows: LDL: 126, A1C: 5.8, TTE reveals EF >55% without evidence of intracardiac thrombus. 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 Post tPA [facility] 5/15 showed interval left basal ganglia hyperdensity, so repeat CTH ordered on 5/16 showing resolution of finding which likely represented contrast staining from IA procedure     How long can you stand comfortably?  no limits    Patient Stated Goals  Patient wants to be able to walk longer distances and not walk towards the right side.     Currently in Pain?  No/denies    Multiple Pain Sites  No       Treatment:   Airex pad  Hold ball and trunk rotation   x2 min, CGA for safety, demonstrated difficulty with trunk rotation and keeping arms extended  Airex pad, balloon tapping to mirror  x2 min, supervision for safety with varying directions and speed of balloon, VCs for utilizing both hands and minimizing UE support Agility ladder  Step over hurdle fwd/  bwd, side to side , CGA for safety, VCs to take big enough steps and to try to increase speed to work on coordination   Side stepping x10 on blue balance CGA for safety, VCs for taking a big enough step    Leg press x 100 lbs x 20 x 2  Instructed patient in LE strengthening as part of HEP:  machine weights for knee flex, knee extension,                      PT Education - 08/27/18 1358    Education Details  HEP    Person(s) Educated  Patient    Methods  Explanation;Demonstration;Tactile cues    Comprehension  Returned demonstration;Verbalized understanding;Need further  instruction       PT Short Term Goals - 06/16/18 4098      PT SHORT TERM GOAL #1   Title  Patient will be independent in home exercise program to improve strength/mobility for better functional independence with ADLs.    Time  6    Period  Weeks    Status  New    Target Date  07/28/18      PT SHORT TERM GOAL #2   Title  Patient will increase six minute walk test distance to >1000 for progression to community ambulator and improve gait ability    Baseline  585 ft    Time  6    Period  Weeks    Status  New    Target Date  07/28/18        PT Long Term Goals - 08/24/18 1329      PT LONG TERM GOAL #1   Title  Patient will increase 10 meter walk test to >1.35m/s as to improve gait speed for better community ambulation and to reduce fall risk.    Baseline  07/20/18= .95 m/sec, 08/24/18= 1.16 m/sec    Time  12    Period  Weeks    Status  On-going    Target Date  09/07/18      PT LONG TERM GOAL #2   Title  Patient will ascend/descend 4 stairs without rail assist independently without loss of balance to improve ability to get in/out of home.     Baseline  , needs rail 07/20/18, 08/24/18 reciprical without railing    Time  12    Period  Weeks    Status  On-going    Target Date  09/07/18      PT LONG TERM GOAL #3   Title   Patient will demonstrate an improved Berg Balance Score of > as to demonstrate improved balance with ADLs such as sitting/standing and transfer balance and reduced fall risk    Baseline  49/56 eval, 52/56 07/20/18, 08/24/18  54/56 berg    Time  12    Period  Weeks    Status  On-going    Target Date  09/07/18      PT LONG TERM GOAL #4   Title  Patient will increase BLE gross strength to 4+/5 as to improve functional strength for independent gait, increased standing tolerance and increased ADL ability.    Baseline  4/5 BLE 07/20/18, 08/24/18 4/5 BLE flex, 3+/5 BLe hip abd     Time  12    Period  Weeks    Status  On-going    Target Date  09/07/18      PT LONG  TERM GOAL #5   Title  Patient  will reduce timed up and go to <11 seconds to reduce fall risk and demonstrate improved transfer/gait ability.    Baseline  08/24/18  22 sec    Time  8    Period  Weeks    Status  New    Target Date  09/07/18            Plan - 08/27/18 1359    Clinical Impression Statement  Patient demonstrates LOB with standing balance exercises indicating decreased balancing strategies. Patient did require UE support to perform sidestepping up and over exercise. Patient will benefit from further skilled therapy to return to prior level of function.   Pt was encouraged to perform HEP during the week in order to continue progressing balance and strength interventions.  Pt would continue to benefit from skilled therapy services in order to further address LE strength deficits and balance deficits in order to decrease fall risk and improve mobility.    Rehab Potential  Good    PT Frequency  2x / week    PT Duration  12 weeks    PT Treatment/Interventions  Manual techniques;Patient/family education;Balance training;Functional mobility training;Therapeutic activities;Therapeutic exercise;Gait training;Aquatic Therapy       Patient will benefit from skilled therapeutic intervention in order to improve the following deficits and impairments:  Abnormal gait, Decreased balance, Decreased endurance, Decreased mobility, Difficulty walking, Decreased activity tolerance, Decreased safety awareness, Decreased strength  Visit Diagnosis: Muscle weakness (generalized)  Difficulty in walking, not elsewhere classified     Problem List There are no active problems to display for this patient.   [address] , [place] DPT 08/27/2018, 2:01 PM  [facility] MAIN [facility] SERVICES [address] [place], [place], [address] Phone: [phone]   Fax:  [phone]  Name: Adriana Hughes MRN: [id number] Date of Birth: 12-12-1940

## 2018-08-31 ENCOUNTER — Ambulatory Visit: Payer: Medicare Other | Admitting: Physical Therapy

## 2018-08-31 ENCOUNTER — Encounter: Payer: Self-pay | Admitting: Physical Therapy

## 2018-08-31 ENCOUNTER — Ambulatory Visit: Payer: Medicare Other | Admitting: Speech Pathology

## 2018-08-31 ENCOUNTER — Encounter: Payer: Self-pay | Admitting: Speech Pathology

## 2018-08-31 DIAGNOSIS — R4701 Aphasia: Secondary | ICD-10-CM | POA: Diagnosis not present

## 2018-08-31 DIAGNOSIS — R262 Difficulty in walking, not elsewhere classified: Secondary | ICD-10-CM

## 2018-08-31 DIAGNOSIS — M6281 Muscle weakness (generalized): Secondary | ICD-10-CM

## 2018-08-31 NOTE — Therapy (Signed)
[place] [facility] MAIN [facility] SERVICES [address] [place], [place], [address] Phone: [phone]   Fax:  [phone]  Physical Therapy Treatment  Patient Details  Name: Adriana Hughes MRN: [id number] Date of Birth: 09/03/1941 Referring Provider (PT): Clearence Cheek    Encounter Date: 08/31/2018  PT End of Session - 08/31/18 1618    Visit Number  31    Number of Visits  41    Date for PT Re-Evaluation  09/07/18    Authorization Type  2/10    PT Start Time  0400    PT Stop Time  0440    PT Time Calculation (min)  40 min    Equipment Utilized During Treatment  Gait belt    Activity Tolerance  Patient tolerated treatment well;Patient limited by fatigue    Behavior During Therapy  [facility] for tasks assessed/performed       Past Medical History:  Diagnosis Date  . Arthritis   . Fibrocystic breast disease   . GERD (gastroesophageal reflux disease)   . Hyperlipidemia   . Hypertension     Past Surgical History:  Procedure Laterality Date  . APPENDECTOMY    . BREAST BIOPSY    . BREAST EXCISIONAL BIOPSY Bilateral [id number]   benign  . EYE SURGERY    . TEE WITHOUT CARDIOVERSION N/A 04/21/2018   Procedure: TRANSESOPHAGEAL ECHOCARDIOGRAM (TEE);  Surgeon: Lamar Blinks, MD;  Location: [facility] ORS;  Service: Cardiovascular;  Laterality: N/A;    There were no vitals filed for this visit.  Subjective Assessment - 08/31/18 1617    Subjective  Patient reports doing well; Denies any pain; Denies any new changes;     Patient is accompained by:  Family member    Pertinent History  Per discharge summary from [facility]: Ischemic Stroke s/p alteplase and IA:  Presented initially with symptoms on arrival notable for left gaze deviation, right sided hemiparesis, hemianopia, and right facial droop. Primary stroke risk factors: hypertension. She was found to be a candidate for tPa that she received on 5/15. She was also a candidate for IA and brought in for successful  mechanical thrombectomy with good restoration of blood flow after one pass, TICI 3. Patient monitored in the ICU initially and required intubation due to frequent vomiting and concern for airway protection. She was successfully extubated on 5/16 and transferred to the floor without issues. Risk stratification obtained while inpatient as follows: LDL: 126, A1C: 5.8, TTE reveals EF >55% without evidence of intracardiac thrombus. 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 Post tPA [facility] 5/15 showed interval left basal ganglia hyperdensity, so repeat CTH ordered on 5/16 showing resolution of finding which likely represented contrast staining from IA procedure     How long can you stand comfortably?  no limits    Patient Stated Goals  Patient wants to be able to walk longer distances and not walk towards the right side.     Currently in Pain?  No/denies    Multiple Pain Sites  No       Treatment:  Octane fitness level 2 x 5 mins TM walking side stepping . 4 miles / hour x 3 mins left and 3 mins right, UE support  Standing on blue foam and tapping 6 inch stool x 20  Standing on blue foam and step ups x 10 x 2 without UE support and CGA Matrix 22. 5 lbs side stepping  x 3 left and right Leg press x 75 lbs x 15 x 2    Pt educated throughout session about proper posture and technique with exercises. Improved exercise technique, movement at target joints, use of target muscles after min to mod verbal, visual, tactile cues                       PT Education - 08/31/18 1617    Education Details  HEP    Person(s) Educated  Patient    Methods  Explanation    Comprehension  Verbalized understanding;Returned demonstration       PT Short Term Goals - 06/16/18 0808      PT SHORT TERM GOAL #1   Title  Patient will be independent in home exercise program to improve strength/mobility for better functional independence  with ADLs.    Time  6    Period  Weeks    Status  New    Target Date  07/28/18      PT SHORT TERM GOAL #2   Title  Patient will increase six minute walk test distance to >1000 for progression to community ambulator and improve gait ability    Baseline  585 ft    Time  6    Period  Weeks    Status  New    Target Date  07/28/18        PT Long Term Goals - 08/24/18 1329      PT LONG TERM GOAL #1   Title  Patient will increase 10 meter walk test to >1.42m/s as to improve gait speed for better community ambulation and to reduce fall risk.    Baseline  07/20/18= .95 m/sec, 08/24/18= 1.16 m/sec    Time  12    Period  Weeks    Status  On-going    Target Date  09/07/18      PT LONG TERM GOAL #2   Title  Patient will ascend/descend 4 stairs without rail assist independently without loss of balance to improve ability to get in/out of home.     Baseline  , needs rail 07/20/18, 08/24/18 reciprical without railing    Time  12    Period  Weeks    Status  On-going    Target Date  09/07/18      PT LONG TERM GOAL #3   Title   Patient will demonstrate an improved Berg Balance Score of > as to demonstrate improved balance with ADLs such as sitting/standing and transfer balance and reduced fall risk    Baseline  49/56 eval, 52/56 07/20/18, 08/24/18  54/56 berg    Time  12    Period  Weeks    Status  On-going    Target Date  09/07/18      PT LONG TERM GOAL #4   Title  Patient will increase BLE gross strength to 4+/5 as to improve functional strength for independent gait, increased standing tolerance and increased ADL ability.    Baseline  4/5 BLE 07/20/18, 08/24/18 4/5 BLE flex, 3+/5 BLe hip abd     Time  12    Period  Weeks    Status  On-going    Target Date  09/07/18      PT LONG TERM GOAL #5   Title  Patient will reduce timed up and go to <11 seconds to reduce fall risk and demonstrate improved transfer/gait ability.    Baseline  08/24/18  22 sec  Time  8    Period  Weeks    Status   New    Target Date  09/07/18            Plan - 08/31/18 1646    Clinical Impression Statement  Pt was able to progress dynamic balance exercises today, noting improved postural reactions with LOB and moving outside normal BOS.  Pt was able to perform all exercises on uneven surfaces with minimal LOB noted but continues to have fatigue with standing activities.   Pt would continue to benefit from skilled therapy services to address further balance impairments and decrease falls risk.    Rehab Potential  Good    PT Frequency  2x / week    PT Duration  12 weeks    PT Treatment/Interventions  Manual techniques;Patient/family education;Balance training;Functional mobility training;Therapeutic activities;Therapeutic exercise;Gait training;Aquatic Therapy       Patient will benefit from skilled therapeutic intervention in order to improve the following deficits and impairments:  Abnormal gait, Decreased balance, Decreased endurance, Decreased mobility, Difficulty walking, Decreased activity tolerance, Decreased safety awareness, Decreased strength  Visit Diagnosis: Muscle weakness (generalized)  Difficulty in walking, not elsewhere classified     Problem List There are no active problems to display for this patient.   [address] [place] DPT 08/31/2018, 4:46 PM  [place] [facility] MAIN [facility] SERVICES [address] [place], [place], [address] Phone: [phone]   Fax:  [phone]  Name: Adriana Hughes MRN: [id number] Date of Birth: Jan 10, 1941

## 2018-08-31 NOTE — Therapy (Signed)
[place] MAIN [facility] SERVICES [address] [place], [place], [address] Phone: [phone]   Fax:  [phone]  Speech Language Pathology Treatment  Patient Details  Name: Adriana Hughes MRN: [id number] Date of Birth: 07-May-1941 Referring Provider (SLP): [name], NP   Encounter Date: 08/31/2018  End of Session - 08/31/18 1617    Visit Number  22    Number of Visits  33    Date for SLP Re-Evaluation  09/25/18    Authorization Type  1/10 medicare progress note    SLP Start Time  1400    SLP Stop Time   1451    SLP Time Calculation (min)  51 min    Activity Tolerance  Patient tolerated treatment well       Past Medical History:  Diagnosis Date  . Arthritis   . Fibrocystic breast disease   . GERD (gastroesophageal reflux disease)   . Hyperlipidemia   . Hypertension     Past Surgical History:  Procedure Laterality Date  . APPENDECTOMY    . BREAST BIOPSY    . BREAST EXCISIONAL BIOPSY Bilateral [id number]   benign  . EYE SURGERY    . TEE WITHOUT CARDIOVERSION N/A 04/21/2018   Procedure: TRANSESOPHAGEAL ECHOCARDIOGRAM (TEE);  Surgeon: Corey Skains, MD;  Location: [facility] ORS;  Service: Cardiovascular;  Laterality: N/A;    There were no vitals filed for this visit.  Subjective Assessment - 08/31/18 1616    Subjective  Patient reported she is doing well today and reported she had a quiet weekend.    Currently in Pain?  No/denies            ADULT SLP TREATMENT - 08/31/18 0001      General Information   Behavior/Cognition  Alert;Cooperative;Pleasant mood    Patient Positioning  Upright in chair    HPI  Per discharge summary from [facility]: Ischemic Stroke s/p alteplase and IA:  Presented initially with symptoms on arrival notable for left gaze deviation, right sided hemiparesis, hemianopia, and right facial droop. Primary stroke risk factors: hypertension. She was found to be a candidate for tPa that she received on 5/15. She  was also a candidate for IA and brought in for successful mechanical thrombectomy with good restoration of blood flow after one pass, TICI 3. Patient monitored in the ICU initially and required intubation due to frequent vomiting and concern for airway protection. She was successfully extubated on 5/16 and transferred to the floor without issues. 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       Treatment Provided   Treatment provided  Cognitive-Linquistic      Pain Assessment   Pain Assessment  No/denies pain      Cognitive-Linquistic Treatment   Treatment focused on  Aphasia    Skilled Treatment  Patient named at least 4 semantic features to describe pictured objects with 80% accuracy using SFA chart as needed. Intermittent verbal cues required to use chart to elicit > # and diversity of semantic features. Noted increased reliance on SFA chart and verbal cues today in comparison to last treatment session.  Given 2 target words, patient wrote two novel sentences containing both words with 100% accuracy given min to mod verbal cues.     Assessment / Recommendations / Plan   Plan  Continue with current plan of care      Progression Toward Goals   Progression toward  goals  Progressing toward goals       SLP Education - 08/31/18 1617    Education Details  Compensatory strategies for word finding to aid verbal and written communication    Person(s) Educated  Patient    Methods  Explanation;Demonstration;Verbal cues    Comprehension  Verbalized understanding;Returned demonstration;Verbal cues required;Need further instruction         SLP Long Term Goals - 08/27/18 1408      SLP LONG TERM GOAL #1   Title  Patient will describe pictured objects by naming 3-5 semantic features independently with 80% accuracy.    Baseline  Patient named 4 semantic features independently with 90% accuracy x1 session    Time  8    Period   Weeks    Status  Partially Met      SLP LONG TERM GOAL #2   Title  Patient will name 10 members in a given category with 80% accuracy independently.    Baseline  Patient continues to require min to mod verbal cues to name 10 items in a given category, independently =40-60% accuracy    Time  8    Period  Weeks    Status  On-going      SLP LONG TERM GOAL #3   Title  Patient will compose a novel sentence when given 1-2 target words or when given a picture with 80% accuracy independently.    Baseline  Patient composes a novel sentence when given a picture with 90% independently, when given 1 target word with 90% with min cues.    Time  8    Period  Weeks    Status  Partially Met      SLP LONG TERM GOAL #4   Title  Patient will name semantic features/descriptors when having word finding difficulty in conversation with 80% accuracy independently.    Baseline  Requires min verbal cues    Time  8    Period  Weeks    Status  On-going       Plan - 08/31/18 1618    Clinical Impression Statement  Patient continues to demonstrate improved ability to self identify and self correct phonemic and semantic paraphasias during concrete treatment activities as well as in conversation. However, patient required increased level of cueing to facilitate successful performance on treatment tasks today.   Speech Therapy Frequency  2x / week    Duration  Other (comment)   8 weeks   Treatment/Interventions  Compensatory strategies;SLP instruction and feedback;Patient/family education;Functional tasks    Potential to Achieve Goals  Good    Potential Considerations  Ability to learn/carryover information;Family/community support;Cooperation/participation level    SLP Home Exercise Plan  Word finding exercises, written and verbal    Consulted and Agree with Plan of Care  Patient       Patient will benefit from skilled therapeutic intervention in order to improve the following deficits and impairments:    Aphasia    Problem List There are no active problems to display for this patient.   Victor, MA, CCC-SLP 08/31/2018, 4:25 PM  [place] MAIN [facility] SERVICES [address] [place], [place], [address] Phone: [phone]   Fax:  [phone]   Name: Adriana Hughes MRN: [id number] Date of Birth: 11-22-1940

## 2018-09-01 ENCOUNTER — Other Ambulatory Visit: Payer: Self-pay | Admitting: Family Medicine

## 2018-09-01 DIAGNOSIS — R911 Solitary pulmonary nodule: Secondary | ICD-10-CM

## 2018-09-02 ENCOUNTER — Encounter: Payer: Medicare Other | Admitting: Speech Pathology

## 2018-09-02 ENCOUNTER — Ambulatory Visit: Payer: Medicare Other | Admitting: Physical Therapy

## 2018-09-03 ENCOUNTER — Ambulatory Visit: Payer: Medicare Other

## 2018-09-03 ENCOUNTER — Encounter: Payer: Self-pay | Admitting: Physical Therapy

## 2018-09-03 DIAGNOSIS — M6281 Muscle weakness (generalized): Secondary | ICD-10-CM

## 2018-09-03 DIAGNOSIS — R262 Difficulty in walking, not elsewhere classified: Secondary | ICD-10-CM

## 2018-09-03 DIAGNOSIS — R4701 Aphasia: Secondary | ICD-10-CM | POA: Diagnosis not present

## 2018-09-03 NOTE — Therapy (Signed)
[facility] MAIN [facility] SERVICES [address] [place], [place], [address] Phone: [phone]   Fax:  [phone]  Physical Therapy Treatment  Patient Details  Name: Adriana Hughes MRN: [id number] Date of Birth: May 25, 1941 Referring Provider (PT): Clearence Cheek    Encounter Date: 09/03/2018  PT End of Session - 09/03/18 1306    Visit Number  32    Number of Visits  41    Date for PT Re-Evaluation  09/07/18    Authorization Type  3/10    PT Start Time  1301    PT Stop Time  1342    PT Time Calculation (min)  41 min    Equipment Utilized During Treatment  Gait belt    Activity Tolerance  Patient tolerated treatment well;Patient limited by fatigue    Behavior During Therapy  [facility] for tasks assessed/performed       Past Medical History:  Diagnosis Date  . Arthritis   . Fibrocystic breast disease   . GERD (gastroesophageal reflux disease)   . Hyperlipidemia   . Hypertension     Past Surgical History:  Procedure Laterality Date  . APPENDECTOMY    . BREAST BIOPSY    . BREAST EXCISIONAL BIOPSY Bilateral [id number]   benign  . EYE SURGERY    . TEE WITHOUT CARDIOVERSION N/A 04/21/2018   Procedure: TRANSESOPHAGEAL ECHOCARDIOGRAM (TEE);  Surgeon: Lamar Blinks, MD;  Location: [facility] ORS;  Service: Cardiovascular;  Laterality: N/A;    There were no vitals filed for this visit.  Subjective Assessment - 09/03/18 1305    Subjective  Patient reports that she is doing well today. States she has not had any falls or stumbles, no pain.     Pertinent History  Per discharge summary from [facility]: Ischemic Stroke s/p alteplase and IA:  Presented initially with symptoms on arrival notable for left gaze deviation, right sided hemiparesis, hemianopia, and right facial droop. Primary stroke risk factors: hypertension. She was found to be a candidate for tPa that she received on 5/15. She was also a candidate for IA and brought in for successful mechanical  thrombectomy with good restoration of blood flow after one pass, TICI 3. Patient monitored in the ICU initially and required intubation due to frequent vomiting and concern for airway protection. She was successfully extubated on 5/16 and transferred to the floor without issues. Risk stratification obtained while inpatient as follows: LDL: 126, A1C: 5.8, TTE reveals EF >55% without evidence of intracardiac thrombus. 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 Post tPA [facility] 5/15 showed interval left basal ganglia hyperdensity, so repeat CTH ordered on 5/16 showing resolution of finding which likely represented contrast staining from IA procedure     Currently in Pain?  No/denies       Treatment:  Octane fitness level 2 x 4 mins  Side stepping in hall way, intermittent verbal cues to maintain feet in neutral position CGA/supervision x158ft both directoins Sidestepping grapevine with demo and verbal cues for 11ft both directions CGA Standing on blue foam and tapping 6 inch stool x 20  Standing on blue foam and step ups x 10 x 2 without UE support and CGA  Tandem walking on blue balance beam 3 rounds, intermittent use of UE support, decreased throughout reps with verbal cues Matrix backward walking with 17.5# 3 rounds Matrix forward walking with 1.5# 3 rounds Leg press heel raises x 75 lbs  x 30x2?   Standing hip abduction with GTB x10 YTB x10 Standing hip extension YTB 2x10  ?Pt educated throughout session about proper posture and technique with exercises. Improved exercise technique, movement at target joints, use of target muscles after min to mod verbal, visual, tactile cues. 1-2 sitting rest breaks needed due to fatigue.    PT Education - 09/03/18 1305    Education Details  therex technique/form    Person(s) Educated  Patient    Methods  Explanation;Verbal cues    Comprehension  Verbalized understanding;Returned  demonstration       PT Short Term Goals - 06/16/18 0808      PT SHORT TERM GOAL #1   Title  Patient will be independent in home exercise program to improve strength/mobility for better functional independence with ADLs.    Time  6    Period  Weeks    Status  New    Target Date  07/28/18      PT SHORT TERM GOAL #2   Title  Patient will increase six minute walk test distance to >1000 for progression to community ambulator and improve gait ability    Baseline  585 ft    Time  6    Period  Weeks    Status  New    Target Date  07/28/18        PT Long Term Goals - 08/24/18 1329      PT LONG TERM GOAL #1   Title  Patient will increase 10 meter walk test to >1.34m/s as to improve gait speed for better community ambulation and to reduce fall risk.    Baseline  07/20/18= .95 m/sec, 08/24/18= 1.16 m/sec    Time  12    Period  Weeks    Status  On-going    Target Date  09/07/18      PT LONG TERM GOAL #2   Title  Patient will ascend/descend 4 stairs without rail assist independently without loss of balance to improve ability to get in/out of home.     Baseline  , needs rail 07/20/18, 08/24/18 reciprical without railing    Time  12    Period  Weeks    Status  On-going    Target Date  09/07/18      PT LONG TERM GOAL #3   Title   Patient will demonstrate an improved Berg Balance Score of > as to demonstrate improved balance with ADLs such as sitting/standing and transfer balance and reduced fall risk    Baseline  49/56 eval, 52/56 07/20/18, 08/24/18  54/56 berg    Time  12    Period  Weeks    Status  On-going    Target Date  09/07/18      PT LONG TERM GOAL #4   Title  Patient will increase BLE gross strength to 4+/5 as to improve functional strength for independent gait, increased standing tolerance and increased ADL ability.    Baseline  4/5 BLE 07/20/18, 08/24/18 4/5 BLE flex, 3+/5 BLe hip abd     Time  12    Period  Weeks    Status  On-going    Target Date  09/07/18      PT  LONG TERM GOAL #5   Title  Patient will reduce timed up and go to <11 seconds to reduce fall risk and demonstrate improved transfer/gait ability.    Baseline  08/24/18  22 sec    Time  8  Period  Weeks    Status  New    Target Date  09/07/18            Plan - 09/03/18 1350    Clinical Impression Statement  Patient had most difficulty with dynamic balance exercises today, especially with RLE stance activities. Difficulty noted with controlling TKE and inversion/eversion of R ankle during standing activities. CGA and close supervision throughout to maintain safety.     PT Frequency  2x / week    PT Duration  12 weeks    PT Treatment/Interventions  Manual techniques;Patient/family education;Balance training;Functional mobility training;Therapeutic activities;Therapeutic exercise;Gait training;Aquatic Therapy       Patient will benefit from skilled therapeutic intervention in order to improve the following deficits and impairments:  Abnormal gait, Decreased balance, Decreased endurance, Decreased mobility, Difficulty walking, Decreased activity tolerance, Decreased safety awareness, Decreased strength  Visit Diagnosis: Muscle weakness (generalized)  Difficulty in walking, not elsewhere classified     Problem List There are no active problems to display for this patient.  Olga Coaster PT, DPT 1:53 PM,09/03/18 [phone]  [facility] MAIN [facility] SERVICES [address] [place], [place], [address] Phone: [phone]   Fax:  [phone]  Name: Adriana Hughes MRN: [id number] Date of Birth: 05/16/1941

## 2018-09-04 ENCOUNTER — Ambulatory Visit: Payer: Medicare Other

## 2018-09-07 ENCOUNTER — Ambulatory Visit: Payer: Medicare Other | Attending: Nurse Practitioner

## 2018-09-07 ENCOUNTER — Ambulatory Visit: Payer: Medicare Other | Admitting: Speech Pathology

## 2018-09-07 ENCOUNTER — Encounter: Payer: Self-pay | Admitting: Speech Pathology

## 2018-09-07 DIAGNOSIS — R4701 Aphasia: Secondary | ICD-10-CM

## 2018-09-07 DIAGNOSIS — M6281 Muscle weakness (generalized): Secondary | ICD-10-CM | POA: Insufficient documentation

## 2018-09-07 DIAGNOSIS — R262 Difficulty in walking, not elsewhere classified: Secondary | ICD-10-CM | POA: Diagnosis present

## 2018-09-07 NOTE — Addendum Note (Signed)
Addended by: Fabio Bering on: 09/07/2018 04:14 PM   Modules accepted: Orders

## 2018-09-07 NOTE — Therapy (Addendum)
[facility] MAIN [facility] SERVICES [address] [place], [place], [address] Phone: [phone]   Fax:  [phone]  Physical Therapy Treatment and Recertification    Dates of reporting period  08/27/2018   to   09/07/2018   Patient Details  Name: Adriana Hughes MRN: [id number] Date of Birth: 10/03/1941 Referring Provider (PT): Clearence Cheek    Encounter Date: 09/07/2018  PT End of Session - 09/07/18 1309    Visit Number  33    Date for PT Re-Evaluation  11/02/18    Authorization Type  4/10    PT Start Time  1305    PT Stop Time  1346    PT Time Calculation (min)  41 min    Equipment Utilized During Treatment  Gait belt    Activity Tolerance  Patient tolerated treatment well;Patient limited by fatigue    Behavior During Therapy  [facility] for tasks assessed/performed       Past Medical History:  Diagnosis Date  . Arthritis   . Fibrocystic breast disease   . GERD (gastroesophageal reflux disease)   . Hyperlipidemia   . Hypertension     Past Surgical History:  Procedure Laterality Date  . APPENDECTOMY    . BREAST BIOPSY    . BREAST EXCISIONAL BIOPSY Bilateral [id number]   benign  . EYE SURGERY    . TEE WITHOUT CARDIOVERSION N/A 04/21/2018   Procedure: TRANSESOPHAGEAL ECHOCARDIOGRAM (TEE);  Surgeon: Lamar Blinks, MD;  Location: [facility] ORS;  Service: Cardiovascular;  Laterality: N/A;    There were no vitals filed for this visit.  Subjective Assessment - 09/07/18 1308    Subjective  Patient reports that she went to the mountains over the weekend where she did some walking.     Pertinent History  Per discharge summary from [facility]: Ischemic Stroke s/p alteplase and IA:  Presented initially with symptoms on arrival notable for left gaze deviation, right sided hemiparesis, hemianopia, and right facial droop. Primary stroke risk factors: hypertension. She was found to be a candidate for tPa that she received on 5/15. She was also a candidate for  IA and brought in for successful mechanical thrombectomy with good restoration of blood flow after one pass, TICI 3. Patient monitored in the ICU initially and required intubation due to frequent vomiting and concern for airway protection. She was successfully extubated on 5/16 and transferred to the floor without issues. Risk stratification obtained while inpatient as follows: LDL: 126, A1C: 5.8, TTE reveals EF >55% without evidence of intracardiac thrombus. 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 Post tPA [facility] 5/15 showed interval left basal ganglia hyperdensity, so repeat CTH ordered on 5/16 showing resolution of finding which likely represented contrast staining from IA procedure     Currently in Pain?  No/denies       Treatment:  Octane fitness level 2 x 5 mins TM walking side stepping . 4 miles / hour x 3 mins left and 3 mins right, UE support  Sidestepping grapevine with demo and verbal cues for 1100ft x 2 rounds both directions CGA Standing on blue foam and tapping 6 inch stool x 20  Standing on blue foam and step ups x 10 x 2 without UE support and CGA  Matrix backward walking with 17.5# 3 rounds Matrix forward walking with 12.5# 3 rounds Side stepping with matrix 12.7# with R foot leading Side stepping with matrix 7.5# with  L foot leading 1 round , difficulty with control due to R leg weakness at 12.5# 1 round Side stepping over hurdles x10 BLE  without UE support    PT Education - 09/07/18 1308    Education Details  therex technique/form    Person(s) Educated  Patient    Methods  Explanation;Demonstration    Comprehension  Verbalized understanding;Returned demonstration       PT Short Term Goals - 09/07/18 1612      PT SHORT TERM GOAL #1   Title  Patient will be independent in home exercise program to improve strength/mobility for better functional independence with ADLs.    Time  8    Period  Weeks     Status  On-going    Target Date  11/02/18      PT SHORT TERM GOAL #2   Title  Patient will increase six minute walk test distance to >1000 for progression to community ambulator and improve gait ability    Baseline  585 ft    Time  6    Period  Weeks    Status  On-going    Target Date  11/02/18        PT Long Term Goals - 09/07/18 1612      PT LONG TERM GOAL #1   Title  Patient will increase 10 meter walk test to >1.39m/s as to improve gait speed for better community ambulation and to reduce fall risk.    Baseline  07/20/18= .95 m/sec, 08/24/18= 1.16 m/sec    Time  8    Period  Weeks    Status  On-going    Target Date  11/02/18      PT LONG TERM GOAL #2   Title  Patient will ascend/descend 4 stairs without rail assist independently without loss of balance to improve ability to get in/out of home.     Baseline  , needs rail 07/20/18, 08/24/18 reciprical without railing    Time  8    Period  Weeks    Status  On-going    Target Date  11/02/18      PT LONG TERM GOAL #3   Title   Patient will demonstrate an improved Berg Balance Score of > as to demonstrate improved balance with ADLs such as sitting/standing and transfer balance and reduced fall risk    Baseline  49/56 eval, 52/56 07/20/18, 08/24/18  54/56 berg    Time  8    Period  Weeks    Status  On-going    Target Date  11/02/18      PT LONG TERM GOAL #4   Title  Patient will increase BLE gross strength to 4+/5 as to improve functional strength for independent gait, increased standing tolerance and increased ADL ability.    Baseline  4/5 BLE 07/20/18, 08/24/18 4/5 BLE flex, 3+/5 BLe hip abd     Time  8    Period  Weeks    Status  On-going    Target Date  11/02/18      PT LONG TERM GOAL #5   Title  Patient will reduce timed up and go to <11 seconds to reduce fall risk and demonstrate improved transfer/gait ability.    Baseline  08/24/18  22 sec    Time  8    Period  Weeks    Status  On-going    Target Date  11/02/18             Plan -  09/07/18 1435    Clinical Impression Statement  Patient needed intermittent verbal/tactile cues for posture and proper exercise technique. Initially demonstrated more difficulty with dynamic balance activities compared to last session, improvement noted with repetition. Most difficulty with weight side stepping eccentric control for RLE. Overall patient demonstrated most difficulty with dynamic balance activities as well as single LE movements on the R. Goals updated about 2 weeks ago, not reassessed today. The patient would benefit from continued skilled PT to optimize mobility, safety, endurance, balance, ambulation and to continue to progress towards goals to improve ability to perform functional activities.     Rehab Potential  Good    PT Frequency  2x / week    PT Duration  8 weeks    PT Treatment/Interventions  Manual techniques;Patient/family education;Balance training;Functional mobility training;Therapeutic activities;Therapeutic exercise;Gait training;Aquatic Therapy    Consulted and Agree with Plan of Care  Patient       Patient will benefit from skilled therapeutic intervention in order to improve the following deficits and impairments:  Abnormal gait, Decreased balance, Decreased endurance, Decreased mobility, Difficulty walking, Decreased activity tolerance, Decreased safety awareness, Decreased strength  Visit Diagnosis: Muscle weakness (generalized) - Plan: PT plan of care cert/re-cert  Difficulty in walking, not elsewhere classified - Plan: PT plan of care cert/re-cert     Problem List There are no active problems to display for this patient.   Olga Coaster PT, DPT 4:41 PM,09/07/18 [phone]  [facility] MAIN [facility] SERVICES [address] [place], [place], [address] Phone: [phone]   Fax:  [phone]  Name: Adriana Hughes MRN: [id number] Date of Birth: 1940-11-05

## 2018-09-07 NOTE — Therapy (Signed)
[name] MAIN [name] SERVICES [address] [place], [place], [address] Phone: [phone]   Fax:  [phone]  Speech Language Pathology Treatment  Patient Details  Name: Adriana Hughes MRN: [id number] Date of Birth: July 30, 1941 Referring Provider (SLP): [name], NP   Encounter Date: 09/07/2018  End of Session - 09/07/18 1456    Visit Number  23    Number of Visits  33    Date for SLP Re-Evaluation  09/25/18    Authorization Type  2/10 medicare progress note    SLP Start Time  1400    SLP Stop Time   1457    SLP Time Calculation (min)  57 min    Activity Tolerance  Patient tolerated treatment well       Past Medical History:  Diagnosis Date  . Arthritis   . Fibrocystic breast disease   . GERD (gastroesophageal reflux disease)   . Hyperlipidemia   . Hypertension     Past Surgical History:  Procedure Laterality Date  . APPENDECTOMY    . BREAST BIOPSY    . BREAST EXCISIONAL BIOPSY Bilateral [id number]   benign  . EYE SURGERY    . TEE WITHOUT CARDIOVERSION N/A 04/21/2018   Procedure: TRANSESOPHAGEAL ECHOCARDIOGRAM (TEE);  Surgeon: Corey Skains, MD;  Location: [facility] ORS;  Service: Cardiovascular;  Laterality: N/A;    There were no vitals filed for this visit.  Subjective Assessment - 09/07/18 1454    Subjective  Patient reported she is doing well and said she had a great time with her [place] in the mountains this weekend.    Currently in Pain?  No/denies            ADULT SLP TREATMENT - 09/07/18 0001      General Information   Behavior/Cognition  Alert;Cooperative;Pleasant mood    Patient Positioning  Upright in chair    HPI  Per discharge summary from [facility]: Ischemic Stroke s/p alteplase and IA:  Presented initially with symptoms on arrival notable for left gaze deviation, right sided hemiparesis, hemianopia, and right facial droop. Primary stroke risk factors: hypertension. She was found to be a candidate for tPa  that she received on 5/15. She was also a candidate for IA and brought in for successful mechanical thrombectomy with good restoration of blood flow after one pass, TICI 3. Patient monitored in the ICU initially and required intubation due to frequent vomiting and concern for airway protection. She was successfully extubated on 5/16 and transferred to the floor without issues. 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       Treatment Provided   Treatment provided  Cognitive-Linquistic      Pain Assessment   Pain Assessment  No/denies pain      Cognitive-Linquistic Treatment   Treatment focused on  Aphasia    Skilled Treatment  Patient named at least 4 semantic features to describe pictured objects with 90% accuracy using SFA chart as needed. Intermittent verbal cues required to use chart to elicit > # and diversity of semantic features.   Patient named 10 members in a given category with 100% accuracy given intermittent min verbal cues (named 9 independently depending on the category named).  Patient named 2 semantic descriptors when having word finding difficulty in conversation with 100% accuracy given min verbal cues.     Assessment / Recommendations / Plan   Plan  Continue with current  plan of care      Progression Toward Goals   Progression toward goals  Progressing toward goals       SLP Education - 09/07/18 1455    Education Details  compensatory strategies for word finding to aid verbal and written communication    Person(s) Educated  Patient    Methods  Demonstration;Explanation;Verbal cues    Comprehension  Verbalized understanding;Need further instruction;Returned demonstration         SLP Long Term Goals - 08/27/18 1408      SLP LONG TERM GOAL #1   Title  Patient will describe pictured objects by naming 3-5 semantic features independently with 80% accuracy.    Baseline  Patient named 4 semantic  features independently with 90% accuracy x1 session    Time  8    Period  Weeks    Status  Partially Met      SLP LONG TERM GOAL #2   Title  Patient will name 10 members in a given category with 80% accuracy independently.    Baseline  Patient continues to require min to mod verbal cues to name 10 items in a given category, independently =40-60% accuracy    Time  8    Period  Weeks    Status  On-going      SLP LONG TERM GOAL #3   Title  Patient will compose a novel sentence when given 1-2 target words or when given a picture with 80% accuracy independently.    Baseline  Patient composes a novel sentence when given a picture with 90% independently, when given 1 target word with 90% with min cues.    Time  8    Period  Weeks    Status  Partially Met      SLP LONG TERM GOAL #4   Title  Patient will name semantic features/descriptors when having word finding difficulty in conversation with 80% accuracy independently.    Baseline  Requires min verbal cues    Time  8    Period  Weeks    Status  On-going       Plan - 09/07/18 1457    Clinical Impression Statement  Patient continues to make steady progress on treatment goals, demonstrating consistent increased independence naming semantic features to assist word finding difficulties in both concrete treatment activities as well as in conversation. Likely d/c from SLP treatment next session with home practice/stimulation program.   Speech Therapy Frequency  2x / week    Duration  Other (comment)   8 weeks   Treatment/Interventions  Compensatory strategies;SLP instruction and feedback;Patient/family education;Functional tasks    Potential to Achieve Goals  Good    Potential Considerations  Ability to learn/carryover information;Family/community support;Cooperation/participation level    SLP Home Exercise Plan  Word finding exercises, written and verbal    Consulted and Agree with Plan of Care  Patient       Patient will benefit from  skilled therapeutic intervention in order to improve the following deficits and impairments:   Aphasia    Problem List There are no active problems to display for this patient.   Ashtin Rosner, MA, CCC-SLP 09/07/2018, 4:07 PM  [place] MAIN [facility] SERVICES [address] [place], [place], [address] Phone: [phone]   Fax:  [phone]   Name: Adriana Hughes MRN: [id number] Date of Birth: 1941-02-21

## 2018-09-08 ENCOUNTER — Ambulatory Visit
Admission: RE | Admit: 2018-09-08 | Discharge: 2018-09-08 | Disposition: A | Discharge status: Home / Self Care | Payer: Medicare Other | Source: Ambulatory Visit | Attending: Family Medicine | Admitting: Family Medicine

## 2018-09-08 ENCOUNTER — Ambulatory Visit: Payer: Medicare Other | Admitting: Physical Therapy

## 2018-09-08 ENCOUNTER — Encounter: Payer: Medicare Other | Admitting: Speech Pathology

## 2018-09-08 DIAGNOSIS — R911 Solitary pulmonary nodule: Secondary | ICD-10-CM | POA: Diagnosis present

## 2018-09-08 DIAGNOSIS — I2510 Atherosclerotic heart disease of native coronary artery without angina pectoris: Secondary | ICD-10-CM | POA: Insufficient documentation

## 2018-09-08 DIAGNOSIS — I700 Atherosclerosis of aorta: Secondary | ICD-10-CM | POA: Diagnosis not present

## 2018-09-08 DIAGNOSIS — I2721 Secondary pulmonary arterial hypertension: Secondary | ICD-10-CM | POA: Diagnosis not present

## 2018-09-08 MED ORDER — IOPAMIDOL (ISOVUE-300) INJECTION 61%
75.0000 mL | Freq: Once | INTRAVENOUS | Status: AC | PRN
  Administered 2018-09-08: 75 mL via INTRAVENOUS

## 2018-09-10 ENCOUNTER — Encounter: Payer: Self-pay | Admitting: Physical Therapy

## 2018-09-10 ENCOUNTER — Encounter: Payer: Self-pay | Admitting: Speech Pathology

## 2018-09-10 ENCOUNTER — Ambulatory Visit: Payer: Medicare Other | Admitting: Physical Therapy

## 2018-09-10 ENCOUNTER — Ambulatory Visit: Payer: Medicare Other | Admitting: Speech Pathology

## 2018-09-10 DIAGNOSIS — R4701 Aphasia: Secondary | ICD-10-CM

## 2018-09-10 DIAGNOSIS — R262 Difficulty in walking, not elsewhere classified: Secondary | ICD-10-CM

## 2018-09-10 DIAGNOSIS — M6281 Muscle weakness (generalized): Secondary | ICD-10-CM | POA: Diagnosis not present

## 2018-09-10 NOTE — Therapy (Signed)
[employer] MAIN [facility] SERVICES [address] [place], [place], [address] Phone: [phone]   Fax:  [phone]  Physical Therapy Treatment/ discharge Summary  Patient Details  Name: Adriana Hughes MRN: [id number] Date of Birth: 11/12/1940 Referring Provider (PT): Clearence Cheek    Encounter Date: 09/10/2018  PT End of Session - 09/10/18 1343    Visit Number  34    Date for PT Re-Evaluation  11/02/18    Authorization Type  5/10    PT Start Time  0108    PT Stop Time  0146    PT Time Calculation (min)  38 min    Equipment Utilized During Treatment  Gait belt    Activity Tolerance  Patient tolerated treatment well;Patient limited by fatigue    Behavior During Therapy  [facility] for tasks assessed/performed       Past Medical History:  Diagnosis Date  . Arthritis   . Fibrocystic breast disease   . GERD (gastroesophageal reflux disease)   . Hyperlipidemia   . Hypertension     Past Surgical History:  Procedure Laterality Date  . APPENDECTOMY    . BREAST BIOPSY    . BREAST EXCISIONAL BIOPSY Bilateral [id number]   benign  . EYE SURGERY    . TEE WITHOUT CARDIOVERSION N/A 04/21/2018   Procedure: TRANSESOPHAGEAL ECHOCARDIOGRAM (TEE);  Surgeon: Lamar Blinks, MD;  Location: [facility] ORS;  Service: Cardiovascular;  Laterality: N/A;    There were no vitals filed for this visit.  Subjective Assessment - 09/10/18 1340    Subjective  Patient reports that she is doing great.     Patient is accompained by:  Family member    Pertinent History  Per discharge summary from [facility]: Ischemic Stroke s/p alteplase and IA:  Presented initially with symptoms on arrival notable for left gaze deviation, right sided hemiparesis, hemianopia, and right facial droop. Primary stroke risk factors: hypertension. She was found to be a candidate for tPa that she received on 5/15. She was also a candidate for IA and brought in for successful mechanical thrombectomy with good  restoration of blood flow after one pass, TICI 3. Patient monitored in the ICU initially and required intubation due to frequent vomiting and concern for airway protection. She was successfully extubated on 5/16 and transferred to the floor without issues. Risk stratification obtained while inpatient as follows: LDL: 126, A1C: 5.8, TTE reveals EF >55% without evidence of intracardiac thrombus. 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 Post tPA [facility] 5/15 showed interval left basal ganglia hyperdensity, so repeat CTH ordered on 5/16 showing resolution of finding which likely represented contrast staining from IA procedure     How long can you stand comfortably?  no limits    Patient Stated Goals  Patient wants to be able to walk longer distances and not walk towards the right side.     Multiple Pain Sites  No         TREATMENT   Neuromuscular Re-education  Matrix fwd / bwd, side to side with 22. 5 lbs x 3 reps Toe taps to 2" Airex pad alternating LE x 10 each, 4" Airex pad alternating LE x 10 each; Airex balance with feet apart and eyes closed x 30s; Airex balance with feet together and eyes closed x 30s; 4" hurdle steps x 10 bilateral in staggered stance; Balloon taps standing with feet apart and together on Airex pad  with CGA/minA+1 support due to LOB;     Ther-ex Sit to stand from regular chair with Airex pad on seat 2 x 10 (issued for HEP); Standing mini squats 2 x 10 in // bars without UE support; Standing hip abduction 2#  2 x 10 bilateral; Standing hip extension 2# 2 x 10 bilateral; Standing hip flexion marching 2#  2 x 10 bilateral; Standing HS curls 2#  2 x 10 bilateral;     Pt educated throughout session about proper posture and technique with exercises. Improved exercise technique, movement at target joints, use of target muscles after min to mod verbal, visual, tactile cues.                         PT Education - 09/10/18 1341    Education Details  HEP    Person(s) Educated  Patient    Methods  Explanation    Comprehension  Verbalized understanding;Returned demonstration;Need further instruction       PT Short Term Goals - 09/07/18 1612      PT SHORT TERM GOAL #1   Title  Patient will be independent in home exercise program to improve strength/mobility for better functional independence with ADLs.    Time  8    Period  Weeks    Status  On-going    Target Date  11/02/18      PT SHORT TERM GOAL #2   Title  Patient will increase six minute walk test distance to >1000 for progression to community ambulator and improve gait ability    Baseline  585 ft    Time  6    Period  Weeks    Status  On-going    Target Date  11/02/18        PT Long Term Goals - 09/07/18 1612      PT LONG TERM GOAL #1   Title  Patient will increase 10 meter walk test to >1.3m/s as to improve gait speed for better community ambulation and to reduce fall risk.    Baseline  07/20/18= .95 m/sec, 08/24/18= 1.16 m/sec    Time  8    Period  Weeks    Status  On-going    Target Date  11/02/18      PT LONG TERM GOAL #2   Title  Patient will ascend/descend 4 stairs without rail assist independently without loss of balance to improve ability to get in/out of home.     Baseline  , needs rail 07/20/18, 08/24/18 reciprical without railing    Time  8    Period  Weeks    Status  On-going    Target Date  11/02/18      PT LONG TERM GOAL #3   Title   Patient will demonstrate an improved Berg Balance Score of > as to demonstrate improved balance with ADLs such as sitting/standing and transfer balance and reduced fall risk    Baseline  49/56 eval, 52/56 07/20/18, 08/24/18  54/56 berg    Time  8    Period  Weeks    Status  On-going    Target Date  11/02/18      PT LONG TERM GOAL #4   Title  Patient will increase BLE gross strength to 4+/5 as to improve functional  strength for independent gait, increased standing tolerance and increased ADL ability.    Baseline  4/5 BLE 07/20/18, 08/24/18 4/5 BLE flex, 3+/5 BLe hip abd  Time  8    Period  Weeks    Status  On-going    Target Date  11/02/18      PT LONG TERM GOAL #5   Title  Patient will reduce timed up and go to <11 seconds to reduce fall risk and demonstrate improved transfer/gait ability.    Baseline  08/24/18  22 sec    Time  8    Period  Weeks    Status  On-going    Target Date  11/02/18            Plan - 09/10/18 1343    Clinical Impression Statement  Patient performs strengthening exercises and static and dynamic standing balance activities wihtout a rest period She has reached her goals .  She is going to be DC to  [facility] gym.    Rehab Potential  Good    PT Frequency  2x / week    PT Duration  8 weeks    PT Treatment/Interventions  Manual techniques;Patient/family education;Balance training;Functional mobility training;Therapeutic activities;Therapeutic exercise;Gait training;Aquatic Therapy    Consulted and Agree with Plan of Care  Patient       Patient will benefit from skilled therapeutic intervention in order to improve the following deficits and impairments:  Abnormal gait, Decreased balance, Decreased endurance, Decreased mobility, Difficulty walking, Decreased activity tolerance, Decreased safety awareness, Decreased strength  Visit Diagnosis: Muscle weakness (generalized)  Difficulty in walking, not elsewhere classified     Problem List There are no active problems to display for this patient.   [address], [place] DPT 09/10/2018, 1:55 PM  [facility] MAIN [facility] SERVICES [address] [place], [place], [address] Phone: [phone]   Fax:  [phone]  Name: Adriana Hughes MRN: [id number] Date of Birth: 12/26/1940

## 2018-09-10 NOTE — Therapy (Signed)
[place] MAIN [facility] SERVICES [address] [place], [place], [address] Phone: [phone]   Fax:  [phone]  Speech Language Pathology Treatment  Patient Details  Name: Adriana Hughes MRN: [id number] Date of Birth: 22-May-1941 Referring Provider (SLP): [name], NP   Encounter Date: 09/10/2018  End of Session - 09/10/18 1656    Visit Number  24    Number of Visits  33    Date for SLP Re-Evaluation  09/25/18    Authorization Type  3/10 medicare progress note    SLP Start Time  1400    SLP Stop Time   1453    SLP Time Calculation (min)  53 min    Activity Tolerance  Patient tolerated treatment well       Past Medical History:  Diagnosis Date  . Arthritis   . Fibrocystic breast disease   . GERD (gastroesophageal reflux disease)   . Hyperlipidemia   . Hypertension     Past Surgical History:  Procedure Laterality Date  . APPENDECTOMY    . BREAST BIOPSY    . BREAST EXCISIONAL BIOPSY Bilateral [id number]   benign  . EYE SURGERY    . TEE WITHOUT CARDIOVERSION N/A 04/21/2018   Procedure: TRANSESOPHAGEAL ECHOCARDIOGRAM (TEE);  Surgeon: Corey Skains, MD;  Location: [facility] ORS;  Service: Cardiovascular;  Laterality: N/A;    There were no vitals filed for this visit.  Subjective Assessment - 09/10/18 1652    Subjective  Patient reported she is doing well today and isn't too tired from physical therapy.    Currently in Pain?  No/denies            ADULT SLP TREATMENT - 09/10/18 0001      General Information   Behavior/Cognition  Alert;Cooperative;Pleasant mood    Patient Positioning  Upright in chair    HPI  Per discharge summary from [facility]: Ischemic Stroke s/p alteplase and IA:  Presented initially with symptoms on arrival notable for left gaze deviation, right sided hemiparesis, hemianopia, and right facial droop. Primary stroke risk factors: hypertension. She was found to be a candidate for tPa that she received on 5/15.  She was also a candidate for IA and brought in for successful mechanical thrombectomy with good restoration of blood flow after one pass, TICI 3. Patient monitored in the ICU initially and required intubation due to frequent vomiting and concern for airway protection. She was successfully extubated on 5/16 and transferred to the floor without issues. 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       Treatment Provided   Treatment provided  Cognitive-Linquistic      Pain Assessment   Pain Assessment  No/denies pain      Cognitive-Linquistic Treatment   Treatment focused on  Aphasia    Skilled Treatment  Patient named at least 4 semantic features to describe pictured objects with 90% accuracy using SFA chart as needed. Intermittent verbal cues required to use chart to elicit > # and diversity of semantic features.   Patient named 10 members in a given category with 100% accuracy given intermittent min verbal cues (named 7-8 independently depending on the category named).  Given 2 target words, patient wrote two different novel sentences containing both words with 100% accuracy given min verbal cues.     Assessment / Recommendations / Plan   Plan  Continue with current plan of care  Progression Toward Goals   Progression toward goals  Progressing toward goals       SLP Education - 09/10/18 1655    Education Details  Compensatory strategies for word finding to aid verbal and written communication, and use of home practice program    Person(s) Educated  Patient    Methods  Explanation;Demonstration;Handout;Verbal cues    Comprehension  Verbalized understanding;Returned demonstration         SLP Long Term Goals - 09/10/18 1658      SLP LONG TERM GOAL #1   Title  Patient will describe pictured objects by naming 3-5 semantic features independently with 80% accuracy.    Time  8    Period  Weeks    Status  Achieved       SLP LONG TERM GOAL #2   Title  Patient will name 10 members in a given category with 80% accuracy independently.    Time  8    Period  Weeks    Status  Partially Met   Continues to require min verbal cues to consistently name [place] #3   Title  Patient will compose a novel sentence when given 1-2 target words or when given a picture with 80% accuracy independently.    Time  8    Period  Weeks    Status  Achieved      SLP LONG TERM GOAL #4   Title  Patient will name semantic features/descriptors when having word finding difficulty in conversation with 80% accuracy independently.    Baseline  Requires min verbal cues    Time  8    Period  Weeks    Status  Partially Met   continues to require min cues to use SFA, but successful when given prompts to describe      Plan - 09/10/18 1656    Clinical Impression Statement  Patient has demonstrated consistent progress on treatment goals, meeting 2/4 goals. Remaining 2 goals were nearly met; however patient continues to require min verbal cues to facilitate divergent naming >7 items and min cues to use compensatory strategies in conversation. Patient is very successful once prompted to use SFA to assist word finding in conversation.  Occasional phonemic & semantic paraphasias present in conversational speech. Ability to self identify and correct phonemic and written paraphasias is markedly improved from evaluation. Patient to be discharged from SLP treatment today with home practice program to continue to increase speed and accuracy of word finding skills.   Speech Therapy Frequency  2x / week    Duration  Other (comment)   8 weeks   Treatment/Interventions  Compensatory strategies;SLP instruction and feedback;Patient/family education;Functional tasks    Potential to Achieve Goals  Good    Potential Considerations  Ability to learn/carryover information;Family/community support;Cooperation/participation level    SLP  Home Exercise Plan  Written and verbal word finding home exercise program    Consulted and Agree with Plan of Care  Patient       Patient will benefit from skilled therapeutic intervention in order to improve the following deficits and impairments:   Aphasia    Problem List There are no active problems to display for this patient.   [place], MA, CCC-SLP 09/10/2018, 5:03 PM  [place] MAIN [facility] SERVICES [address] [place], [place], [address] Phone: [phone]   Fax:  [phone]   Name: Adriana Hughes MRN: [id number] Date of Birth: 1941/05/30

## 2018-09-14 ENCOUNTER — Encounter: Payer: Medicare Other | Admitting: Speech Pathology

## 2018-09-15 ENCOUNTER — Ambulatory Visit
Admission: RE | Admit: 2018-09-15 | Discharge: 2018-09-15 | Disposition: A | Discharge status: Home / Self Care | Payer: Medicare Other | Source: Ambulatory Visit | Attending: Family Medicine | Admitting: Family Medicine

## 2018-09-15 DIAGNOSIS — Z1231 Encounter for screening mammogram for malignant neoplasm of breast: Secondary | ICD-10-CM | POA: Insufficient documentation

## 2018-09-17 ENCOUNTER — Ambulatory Visit: Payer: Medicare Other | Admitting: Physical Therapy

## 2018-09-17 ENCOUNTER — Ambulatory Visit: Payer: Medicare Other | Admitting: Speech Pathology

## 2018-09-21 ENCOUNTER — Ambulatory Visit: Payer: Medicare Other | Admitting: Physical Therapy

## 2018-09-21 ENCOUNTER — Encounter: Payer: Medicare Other | Admitting: Speech Pathology

## 2018-09-24 ENCOUNTER — Encounter: Payer: Medicare Other | Admitting: Speech Pathology

## 2018-09-24 ENCOUNTER — Ambulatory Visit: Payer: Medicare Other | Admitting: Physical Therapy

## 2018-09-28 ENCOUNTER — Encounter: Payer: Medicare Other | Admitting: Speech Pathology

## 2018-09-28 ENCOUNTER — Ambulatory Visit: Payer: Medicare Other | Admitting: Physical Therapy

## 2018-10-12 ENCOUNTER — Ambulatory Visit: Payer: Medicare Other | Admitting: Physical Therapy

## 2018-10-12 ENCOUNTER — Encounter: Payer: Medicare Other | Admitting: Speech Pathology

## 2018-10-15 ENCOUNTER — Ambulatory Visit: Payer: Medicare Other | Admitting: Physical Therapy

## 2018-10-15 ENCOUNTER — Encounter: Payer: Medicare Other | Admitting: Speech Pathology

## 2018-10-19 ENCOUNTER — Ambulatory Visit: Payer: Medicare Other | Admitting: Physical Therapy

## 2018-10-19 ENCOUNTER — Encounter: Payer: Medicare Other | Admitting: Speech Pathology

## 2018-10-22 ENCOUNTER — Ambulatory Visit: Payer: Medicare Other | Admitting: Speech Pathology

## 2018-10-22 ENCOUNTER — Ambulatory Visit: Payer: Medicare Other | Admitting: Physical Therapy

## 2018-10-26 ENCOUNTER — Ambulatory Visit: Payer: Medicare Other | Admitting: Physical Therapy

## 2018-10-26 ENCOUNTER — Encounter: Payer: Medicare Other | Admitting: Speech Pathology

## 2018-10-29 ENCOUNTER — Ambulatory Visit: Payer: Medicare Other | Admitting: Physical Therapy

## 2018-10-29 ENCOUNTER — Encounter: Payer: Medicare Other | Admitting: Speech Pathology

## 2018-11-02 ENCOUNTER — Ambulatory Visit: Payer: Medicare Other | Admitting: Physical Therapy

## 2018-11-02 ENCOUNTER — Encounter: Payer: Medicare Other | Admitting: Speech Pathology

## 2018-11-05 ENCOUNTER — Encounter: Payer: Medicare Other | Admitting: Speech Pathology

## 2018-11-05 ENCOUNTER — Ambulatory Visit: Payer: Medicare Other | Admitting: Physical Therapy

## 2019-01-04 ENCOUNTER — Ambulatory Visit: Payer: Medicare Other | Admitting: Speech Pathology

## 2019-01-04 ENCOUNTER — Ambulatory Visit: Payer: Medicare Other | Attending: Psychiatry | Admitting: Speech Pathology

## 2019-01-04 ENCOUNTER — Encounter: Payer: Self-pay | Admitting: Speech Pathology

## 2019-01-04 ENCOUNTER — Encounter: Payer: Medicare Other | Admitting: Speech Pathology

## 2019-01-04 DIAGNOSIS — R4701 Aphasia: Secondary | ICD-10-CM

## 2019-01-04 NOTE — Therapy (Signed)
[place] MAIN [facility] SERVICES [address] [place], [place], [address] Phone: [phone]   Fax:  [phone]  Speech Language Pathology Evaluation  Patient Details  Name: Adriana Hughes MRN: [id number] Date of Birth: [birth year] Referring Provider (SLP): [name], NP   Encounter Date: 01/04/2019  End of Session - 01/04/19 1629    Visit Number  1    Number of Visits  17    Date for SLP Re-Evaluation  03/04/19    SLP Start Time  1500    SLP Stop Time   1600    SLP Time Calculation (min)  60 min    Activity Tolerance  Patient tolerated treatment well       Past Medical History:  Diagnosis Date  . Arthritis   . Fibrocystic breast disease   . GERD (gastroesophageal reflux disease)   . Hyperlipidemia   . Hypertension     Past Surgical History:  Procedure Laterality Date  . APPENDECTOMY    . BREAST BIOPSY    . BREAST EXCISIONAL BIOPSY Bilateral [id number]   benign  . EYE SURGERY    . TEE WITHOUT CARDIOVERSION N/A 04/21/2018   Procedure: TRANSESOPHAGEAL ECHOCARDIOGRAM (TEE);  Surgeon: Corey Skains, MD;  Location: [facility] ORS;  Service: Cardiovascular;  Laterality: N/A;    There were no vitals filed for this visit.  Subjective Assessment - 01/04/19 1617    Subjective  Patient reported she is doing very well today, but reports she has returned to therapy because she is having more trouble finding her words.    Currently in Pain?  No/denies         SLP Evaluation [facility] - 01/04/19 1617      SLP Visit Information   SLP Received On  01/04/19    Onset Date  03/18/2018    Medical Diagnosis  CVA      General Information   HPI Pt returns for f/u SLP evaluation due to reported increased word finding difficulties s/p L MCA CVA on 03/18/2018. Pt was seen by this SLP from 8/10-11/05/2018 for tx for mod expressive aphasia. Last tx note states "Patient has demonstrated consistent progress on treatment goals, meeting 2/4 goals. Remaining 2  goals were nearly met; however patient continues to require min verbal cues to facilitate divergent naming >7 items and min cues to use compensatory strategies in conversation. Patient is very successful once prompted to use SFA to assist word finding in conversation.  Occasional phonemic & semantic paraphasias present in conversational speech. Ability to self identify and correct phonemic and written paraphasias is markedly improved from evaluation. Patient to be discharged from SLP treatment today with home practice program to continue to increase speed and accuracy of word finding skills.       Cognition   Overall Cognitive Status  Impaired/Different from baseline    Area of Impairment  Memory    Memory  Decreased short-term memory   decreased immediate memory   Attention  Focused;Sustained    Memory  Impaired    Memory Impairment  Retrieval deficit    Awareness  Appears intact    Problem Solving  Appears intact      Auditory Comprehension   Overall Auditory Comprehension  Appears within functional limits for tasks assessed    Yes/No Questions  Within Functional Limits    Commands  Within Functional Limits    Conversation  Complex    EffectiveTechniques  Extra processing time;Increased volume;Pausing;Repetition;Slowed speech;Stressing words;Visual/Gestural cues  Visual Recognition/Discrimination   Discrimination  Within Function Limits      Reading Comprehension   Reading Status  Within funtional limits      Expression   Primary Mode of Expression  Verbal      Verbal Expression   Overall Verbal Expression  Impaired    Initiation  No impairment    Repetition  No impairment    Naming  Impairment    Responsive  76-100% accurate    Confrontation  75-100% accurate    Convergent  75-100% accurate    Divergent  25-49% accurate    Verbal Errors  Semantic paraphasias    Pragmatics  No impairment    Effective Techniques  Open ended questions;Semantic cues;Sentence  completion;Written cues    Non-Verbal Means of Communication  Not applicable      Written Expression   Dominant Hand  Right    Written Expression  Exceptions to [facility]    Self Formulation Ability  Phrase    Interfering Components  Thought organization    Effective Techniques  Verbal/Language cues;Tactile/Visual cues    Overall Writen Expression  Writes appropriate phrases, but difficulty with grammatically correct and complete sentences     Oral Motor/Sensory Function   Overall Oral Motor/Sensory Function  Appears within functional limits for tasks assessed      Motor Speech   Overall Motor Speech  Appears within functional limits for tasks assessed      Standardized Assessments   Standardized Assessments   Other Assessment      Plan   Duration  Other (comment)   8 weeks        SLP Education - 01/04/19 1629    Education Details  re: role of SLP in expressive aphasia treatment; compensatory strategies for word finding    Person(s) Educated  Patient    Methods  Explanation;Demonstration;Verbal cues    Comprehension  Verbalized understanding;Verbal cues required;Need further instruction         SLP Long Term Goals - 01/04/19 1631      SLP LONG TERM GOAL #1   Title  Patient will describe pictured objects by naming 3-5 semantic features independently with 80% accuracy.    Time  8    Period  Weeks    Status  New      SLP LONG TERM GOAL #2   Title  Patient will name 12 members in a given category with 80% accuracy independently.    Time  8    Period  Weeks    Status  New      SLP LONG TERM GOAL #3   Title  Patient will compose a novel sentence when given 1-2 target words or when given a picture with 80% accuracy independently.    Time  8    Period  Weeks    Status  New      SLP LONG TERM GOAL #4   Title  Patient will name semantic features/descriptors when having word finding difficulty in conversation with 80% accuracy independently.    Time  8    Period  Weeks     Status  New      SLP LONG TERM GOAL #5   Title  Patient will perform short term recall tasks with 80% accuracy independently.    Time  8    Period  Weeks    Status  New       Plan - 01/04/19 1631    Clinical Impression Statement  Patient presents  with mild to moderate expressive aphasia and mild to moderate short term memory difficulties. Expressive aphasia c/b word finding difficulties at the sentence level in conversation with occasional semantic paraphasias, mod deficits with divergent naming tasks, and simple written expression tasks. Receptive language function appears WFL. Mild to moderate short term memory difficulties evident with immediate recall tasks of 5 or more items and decreased accuracy with short term recall of 1/3 items s/p 5 minutes. No evidence of dysarthria or verbal apraxia. Spontaneous speech is clear and 100% intelligible.  Pt will benefit from skilled ST treatment for education and application of compensatory strategies to aid word finding in conversation and written expression as well as to aid short term memory.   Speech Therapy Frequency  2x / week    Duration  Other (comment)   8 weeks   Treatment/Interventions  Compensatory strategies;SLP instruction and feedback;Patient/family education;Functional tasks    Potential to Achieve Goals  Good    Potential Considerations  Ability to learn/carryover information;Family/community support;Cooperation/participation level    SLP Home Exercise Plan  Written and verbal word finding home exercise program    Consulted and Agree with Plan of Care  Patient       Patient will benefit from skilled therapeutic intervention in order to improve the following deficits and impairments:   Expressive aphasia    Problem List There are no active problems to display for this patient.   [place], MA, CCC-SLP 01/04/2019, 5:09 PM  [place] MAIN [facility] SERVICES [address]  [place], [place], [address] Phone: [phone]   Fax:  [phone]  Name: Adriana Hughes MRN: [id number] Date of Birth: [birth year]

## 2019-01-07 ENCOUNTER — Ambulatory Visit: Payer: Medicare Other | Admitting: Speech Pathology

## 2019-01-07 ENCOUNTER — Encounter: Payer: Self-pay | Admitting: Speech Pathology

## 2019-01-07 DIAGNOSIS — R4701 Aphasia: Secondary | ICD-10-CM | POA: Diagnosis not present

## 2019-01-07 NOTE — Therapy (Signed)
[place] MAIN [facility] SERVICES [address] [place], [place], [address] Phone: [phone]   Fax:  [phone]  Speech Language Pathology Treatment  Patient Details  Name: Adriana Hughes MRN: [id number] Date of Birth: February 23, 1941 Referring Provider (SLP): [name], NP   Encounter Date: 01/07/2019  End of Session - 01/07/19 1656    Visit Number  2    Number of Visits  17    Date for SLP Re-Evaluation  03/04/19    Authorization Type  1/10 medicare progress note    SLP Start Time  1500    SLP Stop Time   1550    SLP Time Calculation (min)  50 min    Activity Tolerance  Patient tolerated treatment well       Past Medical History:  Diagnosis Date  . Arthritis   . Fibrocystic breast disease   . GERD (gastroesophageal reflux disease)   . Hyperlipidemia   . Hypertension     Past Surgical History:  Procedure Laterality Date  . APPENDECTOMY    . BREAST BIOPSY    . BREAST EXCISIONAL BIOPSY Bilateral 1638,4665   benign  . EYE SURGERY    . TEE WITHOUT CARDIOVERSION N/A 04/21/2018   Procedure: TRANSESOPHAGEAL ECHOCARDIOGRAM (TEE);  Surgeon: Corey Skains, MD;  Location: [facility] ORS;  Service: Cardiovascular;  Laterality: N/A;    There were no vitals filed for this visit.  Subjective Assessment - 01/07/19 1655    Subjective  Patient reported she is doing very well today, reports no complaints.    Currently in Pain?  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 Pt was seen by this SLP from 8/10-11/05/2018 for tx for mod expressive aphasia. Last tx note states "Patient has demonstrated consistent progress on treatment goals, meeting 2/4 goals. Remaining 2 goals were nearly met;  however patient continues to require min verbal cues to facilitate divergent naming >7 items and min cues to use compensatory strategies in conversation. Patient is very successful once prompted to use SFA to assist word finding in conversation. Occasional phonemic &semantic paraphasias present in conversational speech. Ability to self identify and correct phonemic and written paraphasias is markedly improved from evaluation. Patient to be discharged from SLP treatment today with home practice program to continue to increase speed and accuracy of word finding skills.       Treatment Provided   Treatment provided  Cognitive-Linquistic      Pain Assessment   Pain Assessment  No/denies pain      Cognitive-Linquistic Treatment   Treatment focused on  Aphasia    Skilled Treatment  Given a written description, patient named item described with 80% accuracy given min verbal cues. Noted s/c'd written phonemic paraphasias when attempting to write names of items described.   Patient named an average of 6 items in a named category independently and between 12-16 given mod verbal cues.  Patient named 3-4 semantic features to describe a target word to enable SLP to name target word given semantic features alone with 75% accuracy given visual cues from SFA (semantic feature analysis) chart and min verbal cues.      Assessment / Recommendations /  Plan   Plan  Continue with current plan of care      Progression Toward Goals   Progression toward goals  Progressing toward goals       SLP Education - 01/07/19 1656    Education Details  compensatory strategies for word finding    Person(s) Educated  Patient    Methods  Explanation;Demonstration;Verbal cues;Handout    Comprehension  Verbalized understanding;Need further instruction;Verbal cues required;Returned demonstration         SLP Long Term Goals - 01/04/19 1631      SLP LONG TERM GOAL #1   Title  Patient will describe pictured objects by  naming 3-5 semantic features independently with 80% accuracy.    Time  8    Period  Weeks    Status  New      SLP LONG TERM GOAL #2   Title  Patient will name 12 members in a given category with 80% accuracy independently.    Time  8    Period  Weeks    Status  New      SLP LONG TERM GOAL #3   Title  Patient will compose a novel sentence when given 1-2 target words or when given a picture with 80% accuracy independently.    Time  8    Period  Weeks    Status  New      SLP LONG TERM GOAL #4   Title  Patient will name semantic features/descriptors when having word finding difficulty in conversation with 80% accuracy independently.    Time  8    Period  Weeks    Status  New      SLP LONG TERM GOAL #5   Title  Patient will perform short term recall tasks with 80% accuracy independently.    Time  8    Period  Weeks    Status  New       Plan - 01/07/19 1657    Clinical Impression Statement  Pt demonstrated improved accuracy with word finding tasks given min to moderate verbal and visual cues for compensatory strategies. Provided written home practice materials to support independence using compensatory strategies.   Speech Therapy Frequency  2x / week    Duration  Other (comment)   8 weeks   Treatment/Interventions  Compensatory strategies;SLP instruction and feedback;Patient/family education;Functional tasks    Potential to Achieve Goals  Good    Potential Considerations  Ability to learn/carryover information;Family/community support;Cooperation/participation level    SLP Home Exercise Plan  Written and verbal word finding home exercise program    Consulted and Agree with Plan of Care  Patient       Patient will benefit from skilled therapeutic intervention in order to improve the following deficits and impairments:   Aphasia    Problem List There are no active problems to display for this patient.   [place], MA, CCC-SLP 01/07/2019, 5:05 PM  [place] MAIN [facility] SERVICES [address] [place], [place], [address] Phone: [phone]   Fax:  [phone]   Name: Adriana Hughes MRN: [id number] Date of Birth: July 17, 1941

## 2019-01-11 ENCOUNTER — Encounter: Payer: Self-pay | Admitting: Speech Pathology

## 2019-01-11 ENCOUNTER — Ambulatory Visit: Payer: Medicare Other | Admitting: Speech Pathology

## 2019-01-11 DIAGNOSIS — R4701 Aphasia: Secondary | ICD-10-CM

## 2019-01-11 NOTE — Therapy (Signed)
[place] MAIN [facility] SERVICES [address] [place], [place], [address] Phone: [phone]   Fax:  [phone]  Speech Language Pathology Treatment  Patient Details  Name: Adriana Hughes MRN: [id number] Date of Birth: 05-Mar-1941 Referring Provider (SLP): [name], NP   Encounter Date: 01/11/2019  End of Session - 01/11/19 1629    Visit Number  3    Number of Visits  17    Date for SLP Re-Evaluation  03/04/19    Authorization Type  2/10 medicare progress note       Past Medical History:  Diagnosis Date  . Arthritis   . Fibrocystic breast disease   . GERD (gastroesophageal reflux disease)   . Hyperlipidemia   . Hypertension     Past Surgical History:  Procedure Laterality Date  . APPENDECTOMY    . BREAST BIOPSY    . BREAST EXCISIONAL BIOPSY Bilateral 5003,7048   benign  . EYE SURGERY    . TEE WITHOUT CARDIOVERSION N/A 04/21/2018   Procedure: TRANSESOPHAGEAL ECHOCARDIOGRAM (TEE);  Surgeon: Corey Skains, MD;  Location: [facility] ORS;  Service: Cardiovascular;  Laterality: N/A;    There were no vitals filed for this visit.  Subjective Assessment - 01/11/19 1509    Subjective  Patient reported she is doing very well today, reports no complaints.  (Pended)     Currently in Pain?  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 Pt was seen by this SLP from 8/10-11/05/2018 for tx for mod expressive aphasia. Last tx note states "Patient has demonstrated consistent progress on treatment goals, meeting 2/4 goals. Remaining 2 goals were nearly met; however patient continues to require min verbal cues to facilitate divergent naming >7 items and min cues to use compensatory  strategies in conversation. Patient is very successful once prompted to use SFA to assist word finding in conversation.  Occasional phonemic & semantic paraphasias present in conversational speech. Ability to self identify and correct phonemic and written paraphasias is markedly improved from evaluation. Patient to be discharged from SLP treatment today with home practice program to continue to increase speed and accuracy of word finding skills.        Treatment Provided   Treatment provided  Cognitive-Linquistic      Pain Assessment   Pain Assessment  No/denies pain      Cognitive-Linquistic Treatment   Treatment focused on  Aphasia    Skilled Treatment  Patient named 5 semantic features to describe a target word to enable SLP to name target word given semantic features alone with 70% accuracy given visual cues from SFA (semantic feature analysis) chart and min verbal cues (named 3 semantic features w/100% accuracy independently), requires semi regular cues to name function feature first.   Pt performed immediate memory tasks, repeating a series of 3 unrelated words in forward order with 100% accuracy given min to mod verbal and visual cues and in reverse order with 67% accuracy given min to mod cues.     Assessment / Recommendations / Plan   Plan  Continue with current plan of care      Progression Toward Goals  Progression toward goals  Progressing toward goals       SLP Education - 01/11/19 1629    Education Details  Compensatory strategies for word finding and immediate memory    Person(s) Educated  Patient    Methods  Explanation;Demonstration;Verbal cues;Handout    Comprehension  Verbalized understanding;Need further instruction;Returned demonstration;Verbal cues required         SLP Long Term Goals - 01/04/19 1631      SLP LONG TERM GOAL #1   Title  Patient will describe pictured objects by naming 3-5 semantic features independently with 80% accuracy.    Time  8     Period  Weeks    Status  New      SLP LONG TERM GOAL #2   Title  Patient will name 12 members in a given category with 80% accuracy independently.    Time  8    Period  Weeks    Status  New      SLP LONG TERM GOAL #3   Title  Patient will compose a novel sentence when given 1-2 target words or when given a picture with 80% accuracy independently.    Time  8    Period  Weeks    Status  New      SLP LONG TERM GOAL #4   Title  Patient will name semantic features/descriptors when having word finding difficulty in conversation with 80% accuracy independently.    Time  8    Period  Weeks    Status  New      SLP LONG TERM GOAL #5   Title  Patient will perform short term recall tasks with 80% accuracy independently.    Time  8    Period  Weeks    Status  New       Plan - 01/11/19 1630    Clinical Impression Statement  Verbal and visual cues to "pin" items to remember on fingers most successful in aiding recall of sequence of 3 unrelated words in both forward and reverse order. Pt becoming more fluent naming semantic features to aid word finding;however pt continues to require consistent cues to name function and category feature first to facilitate speed of word retrieval when "stuck."  Provided home practice materials. Pt to bring home tablet to next tx session. SLP to suggest addition of app's to aid daily practice of word finding and short term recall.   Speech Therapy Frequency  2x / week    Duration  Other (comment)   8 weeks   Treatment/Interventions  Compensatory strategies;SLP instruction and feedback;Patient/family education;Functional tasks    Potential to Achieve Goals  Good    Potential Considerations  Ability to learn/carryover information;Family/community support;Cooperation/participation level    SLP Home Exercise Plan  Written and verbal word finding home exercise program    Consulted and Agree with Plan of Care  Patient       Patient will benefit from skilled  therapeutic intervention in order to improve the following deficits and impairments:   Aphasia    Problem List There are no active problems to display for this patient.   [place], MA, CCC-SLP 01/11/2019, 4:33 PM  [place] MAIN [facility] SERVICES [address] [place], [place], [address] Phone: [phone]   Fax:  [phone]   Name: PEJA ALLENDER MRN: [id number] Date of Birth: Dec 14, 1940

## 2019-01-14 ENCOUNTER — Ambulatory Visit: Payer: Medicare Other | Admitting: Speech Pathology

## 2019-01-14 ENCOUNTER — Encounter: Payer: Self-pay | Admitting: Speech Pathology

## 2019-01-14 ENCOUNTER — Other Ambulatory Visit: Payer: Self-pay

## 2019-01-14 DIAGNOSIS — R4701 Aphasia: Secondary | ICD-10-CM

## 2019-01-14 NOTE — Therapy (Signed)
[place] MAIN [facility] SERVICES [address] [place], [place], [address] Phone: [phone]   Fax:  [phone]  Speech Language Pathology Treatment  Patient Details  Name: Adriana Hughes MRN: [id number] Date of Birth: April 05, 1941 Referring Provider (SLP): [name], NP   Encounter Date: 01/14/2019  End of Session - 01/14/19 1205    Visit Number  4    Number of Visits  17    Date for SLP Re-Evaluation  03/04/19    Authorization Type  3/10 medicare progress note    SLP Start Time  1100    SLP Stop Time   1159    SLP Time Calculation (min)  59 min    Activity Tolerance  Patient tolerated treatment well       Past Medical History:  Diagnosis Date  . Arthritis   . Fibrocystic breast disease   . GERD (gastroesophageal reflux disease)   . Hyperlipidemia   . Hypertension     Past Surgical History:  Procedure Laterality Date  . APPENDECTOMY    . BREAST BIOPSY    . BREAST EXCISIONAL BIOPSY Bilateral [id number]   benign  . EYE SURGERY    . TEE WITHOUT CARDIOVERSION N/A 04/21/2018   Procedure: TRANSESOPHAGEAL ECHOCARDIOGRAM (TEE);  Surgeon: Corey Skains, MD;  Location: [facility] ORS;  Service: Cardiovascular;  Laterality: N/A;    There were no vitals filed for this visit.  Subjective Assessment - 01/14/19 1204    Subjective  Patient reported she is attending PT next Monday.    Currently in Pain?  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 Pt was seen by this SLP from 8/10-11/05/2018 for tx for mod expressive aphasia. Last tx note states "Patient has demonstrated consistent progress on treatment goals, meeting 2/4 goals. Remaining 2 goals were nearly met; however patient  continues to require min verbal cues to facilitate divergent naming >7 items and min cues to use compensatory strategies in conversation. Patient is very successful once prompted to use SFA to assist word finding in conversation.  Occasional phonemic & semantic paraphasias present in conversational speech. Ability to self identify and correct phonemic and written paraphasias is markedly improved from evaluation. Patient to be discharged from SLP treatment today with home practice program to continue to increase speed and accuracy of word finding skills.        Treatment Provided   Treatment provided  Cognitive-Linquistic      Pain Assessment   Pain Assessment  No/denies pain      Cognitive-Linquistic Treatment   Treatment focused on  Aphasia    Skilled Treatment  Patient wrote answers to complete analogies with an appropriate word given min verbal cues with 100% accuracy and with 90% accuracy independently.  Patient named 5 semantic features to describe a target word to enable SLP to name target word given semantic features alone with 60% accuracy given visual cues from SFA (semantic feature analysis) chart and mod verbal cues (named 3 semantic features w/100% accuracy independently), requires regular cues to name function feature first.      Assessment / Recommendations / Plan   Plan  Continue with current plan of  care      Progression Toward Goals   Progression toward goals  Progressing toward goals       SLP Education - 01/14/19 1205    Education Details  Compensatory strategies for word finding and immediate memory    Person(s) Educated  Patient    Methods  Explanation;Demonstration;Verbal cues;Handout    Comprehension  Verbalized understanding;Need further instruction;Returned demonstration;Verbal cues required         SLP Long Term Goals - 01/04/19 1631      SLP LONG TERM GOAL #1   Title  Patient will describe pictured objects by naming 3-5 semantic features independently  with 80% accuracy.    Time  8    Period  Weeks    Status  New      SLP LONG TERM GOAL #2   Title  Patient will name 12 members in a given category with 80% accuracy independently.    Time  8    Period  Weeks    Status  New      SLP LONG TERM GOAL #3   Title  Patient will compose a novel sentence when given 1-2 target words or when given a picture with 80% accuracy independently.    Time  8    Period  Weeks    Status  New      SLP LONG TERM GOAL #4   Title  Patient will name semantic features/descriptors when having word finding difficulty in conversation with 80% accuracy independently.    Time  8    Period  Weeks    Status  New      SLP LONG TERM GOAL #5   Title  Patient will perform short term recall tasks with 80% accuracy independently.    Time  8    Period  Weeks    Status  New       Plan - 01/14/19 1206    Clinical Impression Statement  Pt required increased level of cueing to perform semantic feature analysis to aid word finding this session. Regular cues resulted in improved accuracy with word finding tasks. However noted improved accuracy identifying semantic relationships completing analogy tasks. Rec continue with current POC,. Discussed and reviewed recommended app's for her tablet to enable daily home practice of tx goals.   Speech Therapy Frequency  2x / week    Duration  Other (comment)   8 weeks   Treatment/Interventions  Compensatory strategies;SLP instruction and feedback;Patient/family education;Functional tasks    Potential to Achieve Goals  Good    Potential Considerations  Ability to learn/carryover information;Family/community support;Cooperation/participation level    SLP Home Exercise Plan  Written and verbal word finding home exercise program    Consulted and Agree with Plan of Care  Patient       Patient will benefit from skilled therapeutic intervention in order to improve the following deficits and impairments:   Aphasia    Problem  List There are no active problems to display for this patient.   [place], MA, CCC-SLP 01/14/2019, 12:10 PM  [place] MAIN [facility] SERVICES [address] [place], [place], [address] Phone: [phone]   Fax:  [phone]   Name: Adriana Hughes MRN: [id number] Date of Birth: [birth year]

## 2019-01-14 NOTE — Therapy (Deleted)
[place] MAIN [facility] SERVICES [address] [place], [place], [address] Phone: [phone]   Fax:  [phone]  Speech Language Pathology Evaluation  Patient Details  Name: Adriana Hughes MRN: [id number] Date of Birth: 1941-09-15 Referring Provider (SLP): [name], NP   Encounter Date: 01/14/2019  End of Session - 01/14/19 1205    Visit Number  4    Number of Visits  17    Date for SLP Re-Evaluation  03/04/19    Authorization Type  3/10 medicare progress note    SLP Start Time  1100    SLP Stop Time   1159    SLP Time Calculation (min)  59 min    Activity Tolerance  Patient tolerated treatment well       Past Medical History:  Diagnosis Date  . Arthritis   . Fibrocystic breast disease   . GERD (gastroesophageal reflux disease)   . Hyperlipidemia   . Hypertension     Past Surgical History:  Procedure Laterality Date  . APPENDECTOMY    . BREAST BIOPSY    . BREAST EXCISIONAL BIOPSY Bilateral 1638,4665   benign  . EYE SURGERY    . TEE WITHOUT CARDIOVERSION N/A 04/21/2018   Procedure: TRANSESOPHAGEAL ECHOCARDIOGRAM (TEE);  Surgeon: Corey Skains, MD;  Location: [facility] ORS;  Service: Cardiovascular;  Laterality: N/A;    There were no vitals filed for this visit.  Subjective Assessment - 01/14/19 1204    Subjective  Patient reported she is attending PT next Monday.    Currently in Pain?  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 Pt was seen by this SLP from 8/10-11/05/2018 for tx for mod expressive aphasia. Last tx note states "Patient has demonstrated consistent progress on treatment goals, meeting 2/4 goals. Remaining 2 goals were nearly met;  however patient continues to require min verbal cues to facilitate divergent naming >7 items and min cues to use compensatory strategies in conversation. Patient is very successful once prompted to use SFA to assist word finding in conversation.  Occasional phonemic & semantic paraphasias present in conversational speech. Ability to self identify and correct phonemic and written paraphasias is markedly improved from evaluation. Patient to be discharged from SLP treatment today with home practice program to continue to increase speed and accuracy of word finding skills.        Treatment Provided   Treatment provided  Cognitive-Linquistic      Pain Assessment   Pain Assessment  No/denies pain      Cognitive-Linquistic Treatment   Treatment focused on  Aphasia    Skilled Treatment  Patient      Assessment / Recommendations / Plan   Plan  Continue with current plan of care      Progression Toward Goals   Progression toward goals  Progressing toward goals        SLP Education - 01/14/19 1205    Education Details  Compensatory strategies for word finding and immediate memory    Person(s) Educated  Patient    Methods  Explanation;Demonstration;Verbal cues;Handout    Comprehension  Verbalized understanding;Need further instruction;Returned demonstration;Verbal cues required         SLP Long Term Goals - 01/04/19 1631      SLP LONG TERM GOAL #1   Title  Patient will describe pictured objects by naming 3-5 semantic features independently with 80% accuracy.    Time  8    Period  Weeks    Status  New      SLP LONG TERM GOAL #2   Title  Patient will name 12 members in a given category with 80% accuracy independently.    Time  8    Period  Weeks    Status  New      SLP LONG TERM GOAL #3   Title  Patient will compose a novel sentence when given 1-2 target words or when given a picture with 80% accuracy independently.    Time  8    Period  Weeks    Status  New      SLP LONG TERM  GOAL #4   Title  Patient will name semantic features/descriptors when having word finding difficulty in conversation with 80% accuracy independently.    Time  8    Period  Weeks    Status  New      SLP LONG TERM GOAL #5   Title  Patient will perform short term recall tasks with 80% accuracy independently.    Time  8    Period  Weeks    Status  New       Plan - 01/14/19 1206    Clinical Impression Statement  Pt    Speech Therapy Frequency  2x / week    Duration  Other (comment)   8 weeks   Treatment/Interventions  Compensatory strategies;SLP instruction and feedback;Patient/family education;Functional tasks    Potential to Achieve Goals  Good    Potential Considerations  Ability to learn/carryover information;Family/community support;Cooperation/participation level    SLP Home Exercise Plan  Written and verbal word finding home exercise program    Consulted and Agree with Plan of Care  Patient       Patient will benefit from skilled therapeutic intervention in order to improve the following deficits and impairments:   Aphasia    Problem List There are no active problems to display for this patient.   Amarri Satterly 01/14/2019, 12:09 PM  [place] MAIN [facility] SERVICES [address] [place], [place], [address] Phone: [phone]   Fax:  [phone]  Name: Adriana Hughes MRN: [id number] Date of Birth: Feb 06, 1941

## 2019-01-18 ENCOUNTER — Ambulatory Visit: Payer: Medicare Other | Admitting: Speech Pathology

## 2019-01-18 ENCOUNTER — Ambulatory Visit: Payer: Medicare Other

## 2019-01-21 ENCOUNTER — Encounter: Payer: Medicare Other | Admitting: Speech Pathology

## 2019-01-25 ENCOUNTER — Encounter: Payer: Medicare Other | Admitting: Speech Pathology

## 2019-01-26 NOTE — Therapy (Unsigned)
[place] [facility] MAIN [facility] SERVICES [address] [place], [place], [address] Phone: [phone]   Fax:  [phone]  Patient Details  Name: Adriana Hughes MRN: [id number] Date of Birth: Mar 19, 1941 Referring Provider:  No ref. provider found  Encounter Date: 01/26/2019    DPT called patient to let patient know the status of the clinic and ensure that we will be reaching out to schedule when we re-open. DPT fielded any questions patient had at this time. Patient had no further questions at this time, and DPT advised that should questions arise patient can reach out to the clinic for guidance via phone or DPT email.   Olga Coaster PT, DPT 1:45 PM,01/26/19 [phone]   [facility] MAIN [facility] SERVICES [address] [place], [place], [address] Phone: [phone]   Fax:  [phone]

## 2019-01-28 ENCOUNTER — Ambulatory Visit: Payer: Medicare Other | Admitting: Speech Pathology

## 2019-01-28 ENCOUNTER — Ambulatory Visit: Payer: Medicare Other

## 2019-02-01 ENCOUNTER — Encounter: Payer: Medicare Other | Admitting: Speech Pathology

## 2019-02-01 ENCOUNTER — Ambulatory Visit: Payer: Medicare Other

## 2019-02-04 ENCOUNTER — Encounter: Payer: Medicare Other | Admitting: Speech Pathology

## 2019-02-08 ENCOUNTER — Encounter: Payer: Medicare Other | Admitting: Speech Pathology

## 2019-02-11 ENCOUNTER — Encounter: Payer: Medicare Other | Admitting: Speech Pathology

## 2019-02-15 ENCOUNTER — Encounter: Payer: Medicare Other | Admitting: Speech Pathology

## 2019-02-15 ENCOUNTER — Ambulatory Visit: Payer: Medicare Other | Admitting: Physical Therapy

## 2019-02-18 ENCOUNTER — Encounter: Payer: Medicare Other | Admitting: Speech Pathology

## 2019-02-18 ENCOUNTER — Encounter: Payer: Self-pay | Admitting: Speech Pathology

## 2019-02-18 NOTE — Therapy (Unsigned)
[place] [facility] MAIN [facility] SERVICES [address] [place], [place], [address] Phone: [phone]   Fax:  [phone]  Patient Details  Name: Adriana Hughes MRN: [id number] Date of Birth: [birth year] Referring Provider:  No ref. provider found  Encounter Date: 02/18/2019   Lynnzie Blackson 02/18/2019, 12:57 PM   [facility] MAIN [facility] SERVICES [address] [place], [place], [address] Phone: [phone]   Fax:  [phone]   [facility]oree Ravis was contacted today regarding temporary reduction of Outpatient Rehabilitation Services due to concerns for community transmission of COVID-19.  Patient identity was verified.  Assessed if patient needed to be seen in person by clinician (recent fall or acute injury that requires hands on assessment and advice, change in diet order, post-surgical, special cases, etc.).     Patient did not have an acute/special need that requires in person visit. Proceeded with phone call.  Therapist advised the patient to continue to perform his/her HEP and assured he/she had no unanswered questions or concerns at this time.   The patient expressed interest in being contacted for a video visit to continue their plan of care when those services become available.  The patient verified that they have a video device and internet access for a telehealth visit.  Patient's MyChart  Account was not currently active. Patient reported she has forgotten her password and has not reset it. [employer] emailed a link to MyChart to assist in resetting password and activating patient's MyChart account.   Patient is aware we can be reached by telephone during limited business hours in the meantime  Jeannette How, MA, CCC-SLP  ***

## 2019-02-22 ENCOUNTER — Encounter: Payer: Medicare Other | Admitting: Speech Pathology

## 2019-02-22 ENCOUNTER — Ambulatory Visit: Payer: Medicare Other

## 2019-02-25 ENCOUNTER — Encounter: Payer: Medicare Other | Admitting: Speech Pathology

## 2019-03-01 ENCOUNTER — Encounter: Payer: Medicare Other | Admitting: Speech Pathology

## 2019-03-01 ENCOUNTER — Ambulatory Visit: Payer: Medicare Other

## 2019-03-03 ENCOUNTER — Ambulatory Visit: Payer: Medicare Other | Attending: Psychiatry | Admitting: Speech Pathology

## 2019-03-03 ENCOUNTER — Other Ambulatory Visit: Payer: Self-pay

## 2019-03-03 ENCOUNTER — Encounter: Payer: Self-pay | Admitting: Speech Pathology

## 2019-03-03 DIAGNOSIS — R4701 Aphasia: Secondary | ICD-10-CM | POA: Diagnosis present

## 2019-03-03 NOTE — Therapy (Signed)
[place] MAIN [facility] SERVICES [address] [place], [place], [address] Phone: [phone]   Fax:  [phone]  Speech Language Pathology Treatment  Patient Details  Name: TEMEKA PORE MRN: [id number] Date of Birth: 05-Dec-1940 Referring Provider (SLP): [name], NP   Encounter Date: 03/03/2019  End of Session - 03/03/19 1231    Visit Number  5    Number of Visits  17    Date for SLP Re-Evaluation  04/29/19    Authorization Type  4/10 visit until medicare progress note    Authorization Time Period  Current certification expires 03/04/2019; few visits completed due to outpatient clinic shutdown due to [place], able to resume tx today via telehealth; will submit re-certification request today.   SLP Start Time  1115    SLP Stop Time   1220   Extra time needed to assist pt setting up computer to connect to telehealth session   SLP Time Calculation (min)  65 min    Activity Tolerance  Patient tolerated treatment well       Past Medical History:  Diagnosis Date  . Arthritis   . Fibrocystic breast disease   . GERD (gastroesophageal reflux disease)   . Hyperlipidemia   . Hypertension     Past Surgical History:  Procedure Laterality Date  . APPENDECTOMY    . BREAST BIOPSY    . BREAST EXCISIONAL BIOPSY Bilateral 8264,1583   benign  . EYE SURGERY    . TEE WITHOUT CARDIOVERSION N/A 04/21/2018   Procedure: TRANSESOPHAGEAL ECHOCARDIOGRAM (TEE);  Surgeon: Corey Skains, MD;  Location: [facility] ORS;  Service: Cardiovascular;  Laterality: N/A;    There were no vitals filed for this visit.  Subjective Assessment - 03/03/19 1227    Subjective  Patient expressed frustration over the quarentine and can't wait to get her hair cut. Husband present & supportive. Husband assisted pt in downloading Webex app and connecting to telehealth visit.   Patient is accompained by:  Family member    Currently in Pain?  No/denies      Therapy Telehealth  Visit:  I connected with Kyrie Bun and her husband today at 11:30 am by [employer] and verified that I am speaking with the correct person using two identifiers.  I discussed the limitations, risks, security and privacy concerns of performing an evaluation and management service by Webex and the availability of in person appointments.   I also discussed with the patient that there may be a patient responsible charge related to this service. The patient expressed understanding and agreed to proceed.   The patient's address was confirmed.  Identified to the patient that therapist is a Programmer, multimedia in the state of [place].  Verified phone # as [phone]  to call in case of technical difficulties.      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 Pt was seen by this SLP from 8/10-11/05/2018 for tx for mod expressive aphasia. Last tx note states "Patient has demonstrated consistent progress on treatment goals, meeting 2/4 goals. Remaining 2 goals were nearly met; however patient continues to require min verbal cues to facilitate divergent naming >7 items and min cues to use compensatory strategies in conversation. Patient  is very successful once prompted to use SFA to assist word finding in conversation.  Occasional phonemic & semantic paraphasias present in conversational speech. Ability to self identify and correct phonemic and written paraphasias is markedly improved from evaluation. Patient to be discharged from SLP treatment today with home practice program to continue to increase speed and accuracy of word finding skills.        Treatment Provided   Treatment provided  Cognitive-Linquistic      Pain Assessment   Pain  Assessment  No/denies pain      Cognitive-Linquistic Treatment   Treatment focused on  Aphasia    Skilled Treatment  Patient described pictured objects by naming 3-5 semantic features given visual prompt of SFA Chart and mod verbal cues with 95% accuracy (32% accuracy independently).  Patient named synonyms given min to mod verbal cues with 70% accuracy.   Patient named similarities and differences given 2 named objects given mod verbal cues with 90% accuracy.  Patient produced 2 variations of a given sentence to state or ask the same meaning given min to mod verbal cues with 80% accuracy.     Assessment / Recommendations / Plan   Plan  Continue with current plan of care      Progression Toward Goals   Progression toward goals  Progressing toward goals       SLP Education - 03/03/19 1230    Education Details  Compensatory strategies for word finding, including semantic feature analysis    Person(s) Educated  Patient    Methods  Explanation;Demonstration;Verbal cues    Comprehension  Verbalized understanding;Returned demonstration;Need further instruction;Verbal cues required         SLP Long Term Goals - 03/03/19 1237      SLP LONG TERM GOAL #1   Title  Patient will describe pictured objects by naming 3-5 semantic features independently with 80% accuracy.    Baseline  Patient named 4 semantic features independently with 90% accuracy x1 session    Time  8    Period  Weeks    Status  Partially Met   6+ week break since last tx due to [place], began telehealth visits today   Target Date  04/29/19      SLP LONG TERM GOAL #2   Title  Patient will name 12 members in a given category with 80% accuracy independently.    Baseline  Patient continues to require min to mod verbal cues to name 10 items in a given category, independently =40-60% accuracy    Time  8    Period  Weeks    Status  On-going   6+ week break since last tx due to [place], began telehealth visits today   Target  Date  04/29/19      SLP LONG TERM GOAL #3   Title  Patient will compose a novel sentence when given 1-2 target words or when given a picture with 80% accuracy independently.    Baseline  Patient composes a novel sentence when given a picture with 90% independently, when given 1 target word with 90% with min cues.    Time  8    Period  Weeks    Status  On-going   6+ week break since last tx due to [place], began telehealth visits today   Target Date  04/29/19      SLP LONG TERM GOAL #4   Title  Patient will name semantic features/descriptors when having word finding difficulty in conversation with 80%  accuracy independently.    Baseline  Requires min verbal cues    Time  8    Period  Weeks    Status  On-going   6+ week break since last tx due to [place], began telehealth visits toda   Target Date  04/29/19      SLP LONG TERM GOAL #5   Title  Patient will perform short term recall tasks with 80% accuracy independently.    Time  8    Period  Weeks    Status  On-going   6+ week break since last tx due to [place], began telehealth visits today   Target Date  04/29/19       Plan - 03/03/19 1236    Clinical Impression Statement  Pt demonstrated increased word finding difficulties and mental flexibility using compensatory strategies as seen in a decreased level of accuracy and independence since last tx in early March, likely due to lack of tx services due to [place]. Rec continue telehealth visits throughout this time of quarentine and resume tx in clinic when allowed to regain independence using word finding strategies to improve functional independence in activities of daily living. Will submit for re-certification today.   Speech Therapy Frequency  2x / week   rec: 2x/week; telehealth currently authorized for 1x/week   Duration  Other (comment)   8 weeks   Treatment/Interventions  Compensatory strategies;SLP instruction and feedback;Patient/family education;Functional tasks    Potential to  Achieve Goals  Good    Potential Considerations  Ability to learn/carryover information;Family/community support;Cooperation/participation level    SLP Home Exercise Plan  Written and verbal word finding home exercise program    Consulted and Agree with Plan of Care  Patient       Patient will benefit from skilled therapeutic intervention in order to improve the following deficits and impairments:   Aphasia    Problem List There are no active problems to display for this patient.   [place], MA, CCC-SLP 03/03/2019, 12:49 PM  [place] MAIN [facility] SERVICES [address] [place], [place], [address] Phone: [phone]   Fax:  [phone]   Name: BREIA OCAMPO MRN: [id number] Date of Birth: June 01, 1941

## 2019-03-04 ENCOUNTER — Ambulatory Visit: Payer: Medicare Other

## 2019-03-04 ENCOUNTER — Encounter: Payer: Medicare Other | Admitting: Speech Pathology

## 2019-03-10 ENCOUNTER — Ambulatory Visit: Payer: Medicare Other | Admitting: Speech Pathology

## 2019-03-11 ENCOUNTER — Ambulatory Visit: Payer: Medicare Other | Attending: Psychiatry | Admitting: Speech Pathology

## 2019-03-11 ENCOUNTER — Other Ambulatory Visit: Payer: Self-pay

## 2019-03-11 ENCOUNTER — Encounter: Payer: Self-pay | Admitting: Speech Pathology

## 2019-03-11 DIAGNOSIS — R4701 Aphasia: Secondary | ICD-10-CM

## 2019-03-11 NOTE — Therapy (Signed)
[place] MAIN [facility] SERVICES [address] [place], [place], [address] Phone: [phone]   Fax:  [phone]  Speech Language Pathology Treatment  Patient Details  Name: BHAVYA ESCHETE MRN: [id number] Date of Birth: 06-Feb-1941 Referring Provider (SLP): [name], NP   Encounter Date: 03/11/2019  End of Session - 03/11/19 1231    Visit Number  6    Number of Visits  17    Date for SLP Re-Evaluation  04/29/19    Authorization Type  5/10 visit until medicare progress note    SLP Start Time  1115   attempted to begin session at 70, however due to technical difficulties could not begin until 11:15   SLP Stop Time   1215    SLP Time Calculation (min)  60 min    Activity Tolerance  Patient tolerated treatment well       Past Medical History:  Diagnosis Date  . Arthritis   . Fibrocystic breast disease   . GERD (gastroesophageal reflux disease)   . Hyperlipidemia   . Hypertension     Past Surgical History:  Procedure Laterality Date  . APPENDECTOMY    . BREAST BIOPSY    . BREAST EXCISIONAL BIOPSY Bilateral 8768,1157   benign  . EYE SURGERY    . TEE WITHOUT CARDIOVERSION N/A 04/21/2018   Procedure: TRANSESOPHAGEAL ECHOCARDIOGRAM (TEE);  Surgeon: Corey Skains, MD;  Location: [facility] ORS;  Service: Cardiovascular;  Laterality: N/A;    There were no vitals filed for this visit.   Therapy Telehealth Visit:  I connected with Helma Argyle today at 11:15am by [facility] video conference and verified that I am speaking with the correct person using two identifiers.  I discussed the limitations, risks, security and privacy concerns of performing an evaluation and management service by Webex and the availability of in person appointments.  I also discussed with the patient that there may be a patient responsible charge related to this service. The patient expressed understanding and agreed to proceed.    The patient's address was confirmed.   Identified to the patient that therapist is a licensed SLP in the state of [place].  Verified phone # as [phone] to call in case of technical difficulties.    Subjective Assessment - 03/11/19 1229    Subjective  Patient appeared to enjoy treatment smiling often. Husband present and supportive.    Patient is accompained by:  Family member    Currently in Pain?  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 Pt was seen by this SLP from 8/10-11/05/2018 for tx for mod expressive aphasia. Last tx note states "Patient has demonstrated consistent progress on treatment goals, meeting 2/4 goals. Remaining 2 goals were nearly met; however patient continues to require min verbal cues to facilitate divergent naming >7 items and min cues to use compensatory strategies in conversation. Patient is very successful once prompted to use SFA to assist word finding in conversation.  Occasional phonemic & semantic paraphasias present in conversational speech. Ability to self identify and correct phonemic and written paraphasias is markedly improved from evaluation. Patient to be discharged from SLP treatment today with home practice program  to continue to increase speed and accuracy of word finding skills.        Treatment Provided   Treatment provided  Cognitive-Linquistic      Pain Assessment   Pain Assessment  No/denies pain      Cognitive-Linquistic Treatment   Treatment focused on  Aphasia    Skilled Treatment Patient described pictured objects by naming 3-5 semantic features given visual prompt of SFA Chart and mod verbal cues with 95% accuracy (55% accuracy independently).  Patient named at least 12 members of a named category with 100% accuracy  given mod verbal cues.  Patient named similarities and differences given 2 named objects given min to mod verbal cues with 85% accuracy.  Patient performed short memory tasks including sequencing 3-4 named items either alphabetically, by size, by height or by weight given min to mod verbal cues with 80% accuracy.     Assessment / Recommendations / Plan   Plan  Continue with current plan of care      Progression Toward Goals   Progression toward goals  Progressing toward goals       SLP Education - 03/11/19 1230    Education Details  Compensatory strategies for word finding, including semantic feature analysis    Person(s) Educated  Patient    Methods  Explanation;Demonstration;Verbal cues;Handout    Comprehension  Verbalized understanding;Returned demonstration;Need further instruction;Verbal cues required         SLP Long Term Goals - 03/03/19 1237      SLP LONG TERM GOAL #1   Title  Patient will describe pictured objects by naming 3-5 semantic features independently with 80% accuracy.    Baseline  Patient named 4 semantic features independently with 90% accuracy x1 session    Time  8    Period  Weeks    Status  Partially Met   6+ week break since last tx due to [place], began telehealth visits today   Target Date  04/29/19      SLP LONG TERM GOAL #2   Title  Patient will name 12 members in a given category with 80% accuracy independently.    Baseline  Patient continues to require min to mod verbal cues to name 10 items in a given category, independently =40-60% accuracy    Time  8    Period  Weeks    Status  On-going   6+ week break since last tx due to [place], began telehealth visits today   Target Date  04/29/19      SLP LONG TERM GOAL #3   Title  Patient will compose a novel sentence when given 1-2 target words or when given a picture with 80% accuracy independently.    Baseline  Patient composes a novel sentence when given a picture with 90% independently, when given  1 target word with 90% with min cues.    Time  8    Period  Weeks    Status  On-going   6+ week break since last tx due to [place], began telehealth visits today   Target Date  04/29/19      SLP LONG TERM GOAL #4   Title  Patient will name semantic features/descriptors when having word finding difficulty in conversation with 80% accuracy independently.    Baseline  Requires min verbal cues    Time  8    Period  Weeks    Status  On-going   6+ week break since last tx due to [place], began telehealth  visits toda   Target Date  04/29/19      SLP LONG TERM GOAL #5   Title  Patient will perform short term recall tasks with 80% accuracy independently.    Time  8    Period  Weeks    Status  On-going   6+ week break since last tx due to [place], began telehealth visits today   Target Date  04/29/19       Plan - 03/11/19 1232    Clinical Impression Statement  Pt demonstrated mild improvements in word finding tasks involving naming similarities and differences of previously targeted items as seen in a decreased level of cueing; however continues to require mod verbal cues for new target items. Noted fatigue as short term memory tasks progressed, initial accuracy high which began to drop as tasks progressed. Home materials to be emailed to patient for daily home practice. Pt will continue to benefit from skilled ST services to regain prior level of independence with functional communication skills.   Speech Therapy Frequency  2x / week   rec 2x/week; however telehealth currently only approved for 1x/week;   Duration  Other (comment)   8 weeks   Treatment/Interventions  Compensatory strategies;SLP instruction and feedback;Patient/family education;Functional tasks    Potential to Achieve Goals  Good    Potential Considerations  Ability to learn/carryover information;Family/community support;Cooperation/participation level    SLP Home Exercise Plan  Written and verbal word finding home exercise  program    Consulted and Agree with Plan of Care  Patient       Patient will benefit from skilled therapeutic intervention in order to improve the following deficits and impairments:   Aphasia    Problem List There are no active problems to display for this patient.   [place], MA, CCC-SLP 03/11/2019, 12:40 PM  [place] MAIN [facility] SERVICES [address] [place], [place], [address] Phone: [phone]   Fax:  [phone]   Name: AALYIAH CAMBEROS MRN: [id number] Date of Birth: 14-Sep-1941

## 2019-03-18 ENCOUNTER — Encounter: Payer: Self-pay | Admitting: Speech Pathology

## 2019-03-18 ENCOUNTER — Ambulatory Visit: Payer: Medicare Other | Admitting: Speech Pathology

## 2019-03-18 ENCOUNTER — Other Ambulatory Visit: Payer: Self-pay

## 2019-03-18 DIAGNOSIS — R4701 Aphasia: Secondary | ICD-10-CM

## 2019-03-18 NOTE — Therapy (Signed)
[place] MAIN [facility] SERVICES [address] [place], [place], [address] Phone: [phone]   Fax:  [phone]  Speech Language Pathology Treatment  Patient Details  Name: Adriana Hughes MRN: [id number] Date of Birth: 1941/07/10 Referring Provider (SLP): [name], NP   Encounter Date: 03/18/2019  End of Session - 03/18/19 1220    Visit Number  7    Number of Visits  17    Date for SLP Re-Evaluation  04/29/19    Authorization Type  6/10 visit until medicare progress note    SLP Start Time  1105   Some, however reduced patient difficulty accessing telehealth visit   SLP Stop Time   1157    SLP Time Calculation (min)  52 min    Activity Tolerance  Patient tolerated treatment well       Past Medical History:  Diagnosis Date  . Arthritis   . Fibrocystic breast disease   . GERD (gastroesophageal reflux disease)   . Hyperlipidemia   . Hypertension     Past Surgical History:  Procedure Laterality Date  . APPENDECTOMY    . BREAST BIOPSY    . BREAST EXCISIONAL BIOPSY Bilateral [id number]   benign  . EYE SURGERY    . TEE WITHOUT CARDIOVERSION N/A 04/21/2018   Procedure: TRANSESOPHAGEAL ECHOCARDIOGRAM (TEE);  Surgeon: Corey Skains, MD;  Location: [facility] ORS;  Service: Cardiovascular;  Laterality: N/A;    There were no vitals filed for this visit.  Subjective Assessment - 03/18/19 1217    Subjective  Patient appeared to enjoy treatment activities, smiling often. Husband present and supportive    Currently in Pain?  No/denies      Therapy Telehealth Visit:  I connected with Carlota Philley today at 11:05 by [facility] video conference and verified that I am speaking with the correct person using two identifiers.  I discussed the limitations, risks, security and privacy concerns of performing an evaluation and management service by Webex and the availability of in person appointments.   I also discussed with the patient that there may be  a patient responsible charge related to this service. The patient expressed understanding and agreed to proceed.   The patient's address was confirmed.  Identified to the patient that therapist is a licensed SLP in the state of .  Verified phone # as [phone] to call in case of technical difficulties.       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 Pt was seen by this SLP from 8/10-11/05/2018 for tx for mod expressive aphasia. Last tx note states "Patient has demonstrated consistent progress on treatment goals, meeting 2/4 goals. Remaining 2 goals were nearly met; however patient continues to require min verbal cues to facilitate divergent naming >7 items and min cues to use compensatory strategies in conversation. Patient is very successful once prompted to use SFA to assist word finding in conversation.  Occasional phonemic & semantic paraphasias present in conversational speech. Ability to self identify and correct phonemic and written paraphasias is markedly improved from evaluation. Patient to be discharged from SLP treatment today with home practice program to continue to increase speed and accuracy of word finding skills.        Treatment Provided  Treatment provided  Cognitive-Linquistic      Pain Assessment   Pain Assessment  No/denies pain      Cognitive-Linquistic Treatment   Treatment focused on  Aphasia    Skilled Treatment  Patient described pictured objects by naming 3-5 semantic features given visual prompt of SFA Chart and mod verbal cues with 95% accuracy (60% accuracy independently).  Patient named similarities and differences given 2 named objects given mod verbal cues with 90% accuracy.  Patient composed a grammatically &  syntactically novel sentence given 2 pictured objects with 100% accuracy given intermittent cues for increased detail.     Assessment / Recommendations / Plan   Plan  Continue with current plan of care      Progression Toward Goals   Progression toward goals  Progressing toward goals       SLP Education - 03/18/19 1219    Education Details  Compensatory strategies for word finding, focusing on semantic feature analysis    Person(s) Educated  Patient    Methods  Explanation;Demonstration;Verbal cues    Comprehension  Verbalized understanding;Returned demonstration;Verbal cues required;Need further instruction         SLP Long Term Goals - 03/03/19 1237      SLP LONG TERM GOAL #1   Title  Patient will describe pictured objects by naming 3-5 semantic features independently with 80% accuracy.    Baseline  Patient named 4 semantic features independently with 90% accuracy x1 session    Time  8    Period  Weeks    Status  Partially Met   6+ week break since last tx due to [place], began telehealth visits today   Target Date  04/29/19      SLP LONG TERM GOAL #2   Title  Patient will name 12 members in a given category with 80% accuracy independently.    Baseline  Patient continues to require min to mod verbal cues to name 10 items in a given category, independently =40-60% accuracy    Time  8    Period  Weeks    Status  On-going   6+ week break since last tx due to [place], began telehealth visits today   Target Date  04/29/19      SLP LONG TERM GOAL #3   Title  Patient will compose a novel sentence when given 1-2 target words or when given a picture with 80% accuracy independently.    Baseline  Patient composes a novel sentence when given a picture with 90% independently, when given 1 target word with 90% with min cues.    Time  8    Period  Weeks    Status  On-going   6+ week break since last tx due to [place], began telehealth visits today   Target Date  04/29/19      SLP LONG  TERM GOAL #4   Title  Patient will name semantic features/descriptors when having word finding difficulty in conversation with 80% accuracy independently.    Baseline  Requires min verbal cues    Time  8    Period  Weeks    Status  On-going   6+ week break since last tx due to [place], began telehealth visits toda   Target Date  04/29/19      SLP LONG TERM GOAL #5   Title  Patient will perform short term recall tasks with 80% accuracy independently.    Time  8    Period  Weeks  Status  On-going   6+ week break since last tx due to COVID, began telehealth visits today   Target Date  04/29/19       Plan - 03/18/19 1220    Clinical Impression Statement Pt demonstrated marked improvement in independence and accuracy with spontaneous generation of novel sentences given 2 target words. However continues to require cues to complete word finding/semantic feature analysis tasks with accuracy, visual cue of SFA chart is helpful to provide focus for word finding and description. Noted slow but steady improving independence with word finding with verbal comparison tasks as trials progressed. Pt will continue to benefit from skilled ST services to regain prior level of independence with functional communication skills.   Speech Therapy Frequency  2x / week   However, telehealth limited to 1x/week   Duration  Other (comment)   8 weeks   Treatment/Interventions  Compensatory strategies;SLP instruction and feedback;Patient/family education;Functional tasks    Potential to Achieve Goals  Good    Potential Considerations  Ability to learn/carryover information;Family/community support;Cooperation/participation level    SLP Home Exercise Plan  Written and verbal word finding home exercise program    Consulted and Agree with Plan of Care  Patient       Patient will benefit from skilled therapeutic intervention in order to improve the following deficits and impairments:   Aphasia    Problem  List There are no active problems to display for this patient.   [place], [place], CCC-SLP 03/18/2019, 12:34 PM  [place] MAIN [facility] SERVICES [address] [place], [place], [address] Phone: [phone]   Fax:  [phone]   Name: TIEARRA COLWELL MRN: [id number] Date of Birth: [birth year]

## 2019-03-25 ENCOUNTER — Ambulatory Visit: Payer: Medicare Other | Admitting: Speech Pathology

## 2019-03-25 ENCOUNTER — Encounter: Payer: Self-pay | Admitting: Speech Pathology

## 2019-03-25 ENCOUNTER — Other Ambulatory Visit: Payer: Self-pay

## 2019-03-25 DIAGNOSIS — R4701 Aphasia: Secondary | ICD-10-CM

## 2019-03-25 NOTE — Therapy (Signed)
[place] MAIN [facility] SERVICES [address] [place], [place], [address] Phone: [phone]   Fax:  [phone]  Speech Language Pathology Treatment  Patient Details  Name: Adriana Hughes MRN: [id number] Date of Birth: [birth year] Referring Provider (SLP): [name], NP   Encounter Date: 03/25/2019  End of Session - 03/25/19 1213    Visit Number  8    Number of Visits  17    Date for SLP Re-Evaluation  04/29/19    Authorization Type  7/10 visit until medicare progress note    SLP Start Time  1059   easy connection to join telehealth appt today, no hiccups!   SLP Stop Time   9924    SLP Time Calculation (min)  58 min    Activity Tolerance  Patient tolerated treatment well       Past Medical History:  Diagnosis Date  . Arthritis   . Fibrocystic breast disease   . GERD (gastroesophageal reflux disease)   . Hyperlipidemia   . Hypertension     Past Surgical History:  Procedure Laterality Date  . APPENDECTOMY    . BREAST BIOPSY    . BREAST EXCISIONAL BIOPSY Bilateral [id number]   benign  . EYE SURGERY    . TEE WITHOUT CARDIOVERSION N/A 04/21/2018   Procedure: TRANSESOPHAGEAL ECHOCARDIOGRAM (TEE);  Surgeon: Corey Skains, MD;  Location: [facility] ORS;  Service: Cardiovascular;  Laterality: N/A;    Therapy Telehealth Visit:  I connected with Marylu Lund  today at 10:59am by [facility] video conference and verified that I am speaking with the correct person using two identifiers.  I discussed the limitations, risks, security and privacy concerns of performing an evaluation and management service by Webex and the availability of in person appointments.   I also discussed with the patient that there may be a patient responsible charge related to this service. The patient expressed understanding and agreed to proceed.   The patient's address was confirmed.  Identified to the patient that therapist is a licensed SLP in the state of  [place].  Verified phone # as [phone] to call in case of technical difficulties.   There were no vitals filed for this visit.  Subjective Assessment - 03/25/19 1210    Subjective  Patient worked hard to complete all tx activities, she did not get frustrated with repeated mistakes but remained persistant to correct her answers.    Currently in Pain?  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 Pt was seen by this SLP from 8/10-11/05/2018 for tx for mod expressive aphasia. Last tx note states "Patient has demonstrated consistent progress on treatment goals, meeting 2/4 goals. Remaining 2 goals were nearly met; however patient continues to require min verbal cues to facilitate divergent naming >7 items and min cues to use compensatory strategies in conversation. Patient is very successful once prompted to use SFA to assist word finding in conversation.  Occasional phonemic & semantic paraphasias present in conversational speech. Ability to self identify and correct phonemic and written paraphasias is markedly improved from evaluation. Patient to be discharged from SLP treatment today with home practice program to continue to increase speed and accuracy of  word finding skills.        Treatment Provided   Treatment provided  Cognitive-Linquistic      Pain Assessment   Pain Assessment  No/denies pain      Cognitive-Linquistic Treatment   Treatment focused on  Aphasia    Skilled Treatment Patient named items in a named category given 1st letter only with 90% accuracy given min to mod verbal cues and 50% accuracy independently.  Patient named 5 semantic features given pictured objects with 100% accuracy given min to moderate verbal cues and  visual cues via semantic feature analysis chart.  Patient composed 3 detailed novel sentences given a pictured scene with 80% accuracy, with im proved level of detail/description given min verbal cues.  Patient spelled four letter words in reverse order with 90% accuracy and five letter words with 70% accuracy given opportunities for repeat presentation.  Patient repeated 3 words in reverse order with 70% accuracy and in ABC order with 80% accuracy.     Assessment / Recommendations / Plan   Plan  Continue with current plan of care      Progression Toward Goals   Progression toward goals  Progressing toward goals       SLP Education - 03/25/19 1213    Education Details  Compensatory strategies for word finding and short term memory/flexibility    Person(s) Educated  Patient    Methods  Explanation;Demonstration;Verbal cues    Comprehension  Verbalized understanding;Need further instruction;Returned demonstration;Verbal cues required         SLP Long Term Goals - 03/03/19 1237      SLP LONG TERM GOAL #1   Title  Patient will describe pictured objects by naming 3-5 semantic features independently with 80% accuracy.    Baseline  Patient named 4 semantic features independently with 90% accuracy x1 session    Time  8    Period  Weeks    Status  Partially Met   6+ week break since last tx due to [place], began telehealth visits today   Target Date  04/29/19      SLP LONG TERM GOAL #2   Title  Patient will name 12 members in a given category with 80% accuracy independently.    Baseline  Patient continues to require min to mod verbal cues to name 10 items in a given category, independently =40-60% accuracy    Time  8    Period  Weeks    Status  On-going   6+ week break since last tx due to [place], began telehealth visits today   Target Date  04/29/19      SLP LONG TERM GOAL #3   Title  Patient will compose a novel sentence when given 1-2 target words or when given a picture with  80% accuracy independently.    Baseline  Patient composes a novel sentence when given a picture with 90% independently, when given 1 target word with 90% with min cues.    Time  8    Period  Weeks    Status  On-going   6+ week break since last tx due to [place], began telehealth visits today   Target Date  04/29/19      SLP LONG TERM GOAL #4   Title  Patient will name semantic features/descriptors when having word finding difficulty in conversation with 80% accuracy independently.    Baseline  Requires min verbal cues    Time  8    Period  Weeks  Status  On-going   6+ week break since last tx due to COVID, began telehealth visits toda   Target Date  04/29/19      SLP LONG TERM GOAL #5   Title  Patient will perform short term recall tasks with 80% accuracy independently.    Time  8    Period  Weeks    Status  On-going   6+ week break since last tx due to [place], began telehealth visits today   Target Date  04/29/19       Plan - 03/25/19 1215    Clinical Impression Statement  Patient continues to have difficulty identifying category name/groups with independence; however consistently identifies them given min verbal cues. Pt continues to demonstrate improvement in independence with spontaneous generation of detailed novel sentences given a decreased level of cueing.  Noted slow but steady improving independence with short term memory/flexibility tasks. Pt will continue to benefit from skilled ST services to regain prior level of independence with functional communication skills. Today's visit was conducted via telehealth due to pt concern attending outpatient clinic during COVID-19 pandemic as her husband has cancer. He is currently undergoing chemotherapy and is immune compromised. Pt would strongly benefit from tx twice a week; however telehealth visits are currently only approved for 1x/week.        Speech Therapy Frequency  2x / week   however telehealth limited to 2x/week    Duration  Other (comment)   8 weeks   Treatment/Interventions  Compensatory strategies;SLP instruction and feedback;Patient/family education;Functional tasks    Potential to Achieve Goals  Good    Potential Considerations  Ability to learn/carryover information;Family/community support;Cooperation/participation level    SLP Home Exercise Plan  Written word finding and short term memory/flexibility home exercise program    Consulted and Agree with Plan of Care  Patient       Patient will benefit from skilled therapeutic intervention in order to improve the following deficits and impairments:   Aphasia    Problem List There are no active problems to display for this patient.   [place], MA, CCC-SLP 03/25/2019, 12:35 PM  [place] MAIN [facility] SERVICES [address] [place], [place], [address] Phone: [phone]   Fax:  [phone]   Name: AHSHA HINSLEY MRN: [id number] Date of Birth: 1941/04/02

## 2019-04-01 ENCOUNTER — Encounter: Payer: Self-pay | Admitting: Speech Pathology

## 2019-04-01 ENCOUNTER — Other Ambulatory Visit: Payer: Self-pay

## 2019-04-01 ENCOUNTER — Ambulatory Visit: Payer: Medicare Other | Admitting: Speech Pathology

## 2019-04-01 DIAGNOSIS — R4701 Aphasia: Secondary | ICD-10-CM | POA: Diagnosis not present

## 2019-04-01 NOTE — Therapy (Signed)
[place] MAIN [facility] SERVICES [address] [place], [place], [address] Phone: [phone]   Fax:  [phone]  Speech Language Pathology Treatment  Patient Details  Name: Adriana Hughes MRN: [id number] Date of Birth: 10-Feb-1941 Referring Provider (SLP): [name], NP   Encounter Date: 04/01/2019  End of Session - 04/01/19 1653    Visit Number  9    Number of Visits  17    Date for SLP Re-Evaluation  04/29/19    Authorization Type  8/10 visit until medicare progress note    SLP Start Time  1101    SLP Stop Time   1155    SLP Time Calculation (min)  54 min    Activity Tolerance  Patient tolerated treatment well       Past Medical History:  Diagnosis Date  . Arthritis   . Fibrocystic breast disease   . GERD (gastroesophageal reflux disease)   . Hyperlipidemia   . Hypertension     Past Surgical History:  Procedure Laterality Date  . APPENDECTOMY    . BREAST BIOPSY    . BREAST EXCISIONAL BIOPSY Bilateral 0947,0962   benign  . EYE SURGERY    . TEE WITHOUT CARDIOVERSION N/A 04/21/2018   Procedure: TRANSESOPHAGEAL ECHOCARDIOGRAM (TEE);  Surgeon: Corey Skains, MD;  Location: [facility] ORS;  Service: Cardiovascular;  Laterality: N/A;    There were no vitals filed for this visit.  Therapy Telehealth Visit:  I connected with Tyger Oka today at 11:01 amby Webex video conference and verified that I am speaking with the correct person using two identifiers.  I discussed the limitations, risks, security and privacy concerns of performing an evaluation and management service by Webex and the availability of in person appointments.   I also discussed with the patient that there may be a patient responsible charge related to this service. The patient expressed understanding and agreed to proceed.   The patient's address was confirmed.  Identified to the patient that therapist is a licensed SLP in the state of [place].  Verified phone # as  [phone] to call in case of technical difficulties.   Subjective Assessment - 04/01/19 1651    Subjective  Pt appeared to enjoy treatment activities smiling often, husband present and supportive.    Patient is accompained by:  Family member    Currently in Pain?  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 Pt was seen by this SLP from 8/10-11/05/2018 for tx for mod expressive aphasia. Last tx note states "Patient has demonstrated consistent progress on treatment goals, meeting 2/4 goals. Remaining 2 goals were nearly met; however patient continues to require min verbal cues to facilitate divergent naming >7 items and min cues to use compensatory strategies in conversation. Patient is very successful once prompted to use SFA to assist word finding in conversation.  Occasional phonemic & semantic paraphasias present in conversational speech. Ability to self identify and correct phonemic and written paraphasias is markedly improved from evaluation. Patient to be discharged from SLP treatment today with home practice program to continue to increase speed and accuracy of word finding skills.  Treatment Provided   Treatment provided  Cognitive-Linquistic      Pain Assessment   Pain Assessment  No/denies pain      Cognitive-Linquistic Treatment   Treatment focused on  Aphasia    Skilled Treatment   Patient named 3 out of 5 requested semantic features given pictured objects with 100% accuracy independently, and named 5 with 90% accuracy given min to moderate verbal cues and visual cues via semantic feature analysis chart.  Patient named an average of 4 items in a named category independently and between 8-10 given mod verbal  cues.  Patient completed analogies presented verbally with 80% accuracy independently.     Assessment / Recommendations / Plan   Plan  Continue with current plan of care      Progression Toward Goals   Progression toward goals  Progressing toward goals       SLP Education - 04/01/19 1652    Education Details  Compensatory strategies for word finding and short term memory/flexibility    Person(s) Educated  Patient    Methods  Explanation;Demonstration;Verbal cues    Comprehension  Verbalized understanding;Need further instruction;Returned demonstration;Verbal cues required         SLP Long Term Goals - 03/03/19 1237      SLP LONG TERM GOAL #1   Title  Patient will describe pictured objects by naming 3-5 semantic features independently with 80% accuracy.    Baseline  Patient named 4 semantic features independently with 90% accuracy x1 session    Time  8    Period  Weeks    Status  Partially Met   6+ week break since last tx due to COVID, began telehealth visits today   Target Date  04/29/19      SLP LONG TERM GOAL #2   Title  Patient will name 12 members in a given category with 80% accuracy independently.    Baseline  Patient continues to require min to mod verbal cues to name 10 items in a given category, independently =40-60% accuracy    Time  8    Period  Weeks    Status  On-going   6+ week break since last tx due to COVID, began telehealth visits today   Target Date  04/29/19      SLP LONG TERM GOAL #3   Title  Patient will compose a novel sentence when given 1-2 target words or when given a picture with 80% accuracy independently.    Baseline  Patient composes a novel sentence when given a picture with 90% independently, when given 1 target word with 90% with min cues.    Time  8    Period  Weeks    Status  On-going   6+ week break since last tx due to COVID, began telehealth visits today   Target Date  04/29/19      SLP LONG TERM GOAL #4   Title  Patient will  name semantic features/descriptors when having word finding difficulty in conversation with 80% accuracy independently.    Baseline  Requires min verbal cues    Time  8    Period  Weeks    Status  On-going   6+ week break since last tx due to COVID, began telehealth visits toda   Target Date  04/29/19      SLP LONG TERM GOAL #5   Title  Patient will perform short term recall tasks with 80% accuracy independently.    Time  8      Period  Weeks    Status  On-going   6+ week break since last tx due to COVID, began telehealth visits today   Target Date  04/29/19       Plan - 04/01/19 1653    Clinical Impression Statement  Pt demonstrated improved independence identifying relationships between target items and word finding skills to complete analogies with accuracy. However struggled with divergent naming tasks and naming concrete visual descriptors for household items today, mod verbal cues were helpful in stimulating word finding. Pt will continue to benefit from skilled ST services to regain prior level of independence with functional communication skills.   Speech Therapy Frequency  2x / week    Duration  Other (comment)   8 weeks   Treatment/Interventions  Compensatory strategies;SLP instruction and feedback;Patient/family education;Functional tasks    Potential to Achieve Goals  Good    Potential Considerations  Ability to learn/carryover information;Family/community support;Cooperation/participation level    SLP Home Exercise Plan  Written word finding and short term memory/flexibility home exercise program    Consulted and Agree with Plan of Care  Patient       Patient will benefit from skilled therapeutic intervention in order to improve the following deficits and impairments:   Aphasia    Problem List There are no active problems to display for this patient.   Novak,Michelle, MA, CCC-SLP 04/01/2019, 4:54 PM  [facility]  SERVICES [address] [place], [place], [address] Phone: [phone]   Fax:  [phone]   Name: Clio R Maday MRN: [id number] Date of Birth: 12/13/1940 

## 2019-04-08 ENCOUNTER — Encounter: Payer: Self-pay | Admitting: Speech Pathology

## 2019-04-08 ENCOUNTER — Other Ambulatory Visit: Payer: Self-pay

## 2019-04-08 ENCOUNTER — Ambulatory Visit: Payer: Medicare Other | Attending: Psychiatry | Admitting: Speech Pathology

## 2019-04-08 DIAGNOSIS — R4701 Aphasia: Secondary | ICD-10-CM | POA: Diagnosis present

## 2019-04-08 NOTE — Therapy (Signed)
[place] MAIN [facility] SERVICES [address] [place], [place], [address] Phone: [phone]   Fax:  [phone]  Speech Language Pathology Treatment  Patient Details  Name: Adriana Hughes MRN: [id number] Date of Birth: 11-22-1940 Referring Provider: [name], NP   Encounter Date: 04/08/2019  End of Session - 04/08/19 1246    Visit Number  10    Number of Visits  17    Date for SLP Re-Evaluation  04/29/19    Authorization Type  9/10 visit until medicare progress note    SLP Start Time  1120    SLP Stop Time   1208    SLP Time Calculation (min)  48 min    Activity Tolerance  Patient tolerated treatment well       Past Medical History:  Diagnosis Date  . Arthritis   . Fibrocystic breast disease   . GERD (gastroesophageal reflux disease)   . Hyperlipidemia   . Hypertension     Past Surgical History:  Procedure Laterality Date  . APPENDECTOMY    . BREAST BIOPSY    . BREAST EXCISIONAL BIOPSY Bilateral [id number]   benign  . EYE SURGERY    . TEE WITHOUT CARDIOVERSION N/A 04/21/2018   Procedure: TRANSESOPHAGEAL ECHOCARDIOGRAM (TEE);  Surgeon: Corey Skains, MD;  Location: [facility] ORS;  Service: Cardiovascular;  Laterality: N/A;    There were no vitals filed for this visit.   Therapy Telehealth Visit:  I connected with Adriana Hughes  today at 11:20am by [facility] video conference and verified that I am speaking with the correct person using two identifiers.  I discussed the limitations, risks, security and privacy concerns of performing an evaluation and management service by Webex and the availability of in person appointments.   I also discussed with the patient that there may be a patient responsible charge related to this service. The patient expressed understanding and agreed to proceed.   The patient's address was confirmed.  Identified to the patient that therapist is a licensed SLP in the state of [place].  Verified phone # as  [phone] to call in case of technical difficulties.   Subjective Assessment - 04/08/19 1243    Subjective  Pt appeared to enjoy treatment activities smiling and joking often throughout tx, pt very understanding about moving appt time 20 minutes due to internet outage at the hospital.    Patient is accompained by:  Family member   husband   Currently in Pain?  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 Pt was seen by this SLP from 8/10-11/05/2018 for tx for mod expressive aphasia. Last tx note states "Patient has demonstrated consistent progress on treatment goals, meeting 2/4 goals. Remaining 2 goals were nearly met; however patient continues to require min verbal cues to facilitate divergent naming >7 items and min cues to use compensatory strategies in conversation. Patient is very successful once prompted to use SFA to assist word finding in conversation.  Occasional phonemic & semantic paraphasias present in conversational speech. Ability to self identify and correct phonemic and written paraphasias is markedly improved from evaluation. Patient to be discharged from SLP treatment today with home  practice program to continue to increase speed and accuracy of word finding skills.        Treatment Provided   Treatment provided  Cognitive-Linquistic      Pain Assessment   Pain Assessment  No/denies pain      Cognitive-Linquistic Treatment   Treatment focused on  Aphasia    Skilled Treatment  Patient named at least 3 semantic features to describe pictured objects with 100% accuracy independently and 5 semantic features with 100% accuracy given min verbal cues.   Patient named 12 members in a named category with 100% accuracy given  min verbal cues (named 5-6 independently depending on the category named).  Given 2 target words, patient verbally composed a novel descriptive sentence containing both words with 100% accuracy independently.  Patient performed the following working memory exercises given 1 verbal presentation: repeated 3 target words in order with 90% accuracy, reverse order with 75% accuracy, and in alphabetical order with 80% accuracy given intermittent verbal cues.     Assessment / Recommendations / Plan   Plan  Continue with current plan of care      Progression Toward Goals   Progression toward goals  Progressing toward goals       SLP Education - 04/08/19 1245    Education Details  Compensatory strategies for word finding and short term memory/flexibility    Person(s) Educated  Patient    Methods  Explanation;Demonstration;Verbal cues    Comprehension  Verbalized understanding;Need further instruction;Returned demonstration;Verbal cues required         SLP Long Term Goals - 04/08/19 1247      SLP LONG TERM GOAL #1   Title  Patient will describe pictured objects by naming 5 semantic features independently with 80% accuracy independently.    Baseline  Patient consistently names 3 semantic features independently with 100% accuracy.    Time  8    Period  Weeks    Status  Revised    Target Date  04/29/19      SLP LONG TERM GOAL #2   Title  Patient will name 12 members in a given category with 80% accuracy independently.    Baseline  Patient continues to require min to mod verbal cues to name 10 items in a given category, independently =40-60% accuracy    Time  8    Period  Weeks    Status  On-going    Target Date  04/29/19      SLP LONG TERM GOAL #3   Title  Patient will compose a novel sentence when given 1-2 target words or when given a picture with 80% accuracy independently.    Time  8    Period  Weeks    Status  Achieved    Target Date  04/08/19      SLP LONG TERM GOAL #4    Title  Patient will name semantic features/descriptors when having word finding difficulty in conversation with 80% accuracy independently.    Baseline  Requires min verbal cues    Time  8    Period  Weeks    Status  On-going    Target Date  04/29/19      SLP LONG TERM GOAL #5   Title  Patient will perform short term recall tasks with 80% accuracy independently.    Time  8    Period  Weeks    Status  On-going    Target Date  04/29/19       Plan -  04/08/19 1246    Clinical Impression Statement  Patient continues to require min verbal cues to complete generative naming tasks with accuracy and SFA tasks with increased level of detail. Pt demonstrated consistent improvement in independence with spontaneous generation of detailed novel sentences, this goal is met today.  Pt demonstrated increased difficulty with working memory tasks requiring mental manipulation of 3 named targets, likely due to mental fatigue today. Pt required regular verbal and visual cues to use working memory strategies. Pt will continue to benefit from skilled ST services to regain prior level of independence with functional communication skills.   Speech Therapy Frequency  2x / week   Rec. 2x/week; however due to insurance only able to do 1 telehealth visit/week   Duration  Other (comment)   8 weeks   Treatment/Interventions  Compensatory strategies;SLP instruction and feedback;Patient/family education;Functional tasks    Potential Considerations  Ability to learn/carryover information;Family/community support;Cooperation/participation level    SLP Home Exercise Plan  Written word finding and short term memory/flexibility home exercise program    Consulted and Agree with Plan of Care  Patient       Patient will benefit from skilled therapeutic intervention in order to improve the following deficits and impairments:   Aphasia    Problem List There are no active problems to display for this  patient.   Max, MA, CCC-SLP 04/08/2019, 12:50 PM  [place] MAIN [facility] SERVICES [address] [place], [place], [address] Phone: [phone]   Fax:  [phone]   Name: Adriana Hughes MRN: [id number] Date of Birth: 07-07-1941

## 2019-04-15 ENCOUNTER — Other Ambulatory Visit: Payer: Self-pay

## 2019-04-15 ENCOUNTER — Encounter: Payer: Self-pay | Admitting: Speech Pathology

## 2019-04-15 ENCOUNTER — Ambulatory Visit: Payer: Medicare Other | Admitting: Speech Pathology

## 2019-04-15 DIAGNOSIS — R4701 Aphasia: Secondary | ICD-10-CM

## 2019-04-15 NOTE — Therapy (Signed)
[place] MAIN [facility] SERVICES [address] [place], [place], [address] Phone: [phone]   Fax:  [phone]  Speech Language Pathology Progress Note/Treatment note  Patient Details  Name: Adriana Hughes MRN: [id number] Date of Birth: Feb 07, 1941 Referring Provider (SLP): [name], NP   Encounter Date: 04/15/2019  End of Session - 04/15/19 1242    Visit Number  11    Number of Visits  17    Date for SLP Re-Evaluation  04/29/19    Authorization Type  10/10 medicare progress note    Authorization Time Period  --    SLP Start Time  1100    SLP Stop Time   1154    SLP Time Calculation (min)  54 min    Activity Tolerance  Patient tolerated treatment well       Past Medical History:  Diagnosis Date  . Arthritis   . Fibrocystic breast disease   . GERD (gastroesophageal reflux disease)   . Hyperlipidemia   . Hypertension     Past Surgical History:  Procedure Laterality Date  . APPENDECTOMY    . BREAST BIOPSY    . BREAST EXCISIONAL BIOPSY Bilateral [id number]   benign  . EYE SURGERY    . TEE WITHOUT CARDIOVERSION N/A 04/21/2018   Procedure: TRANSESOPHAGEAL ECHOCARDIOGRAM (TEE);  Surgeon: Corey Skains, MD;  Location: [facility] ORS;  Service: Cardiovascular;  Laterality: N/A;    There were no vitals filed for this visit.   Therapy Telehealth Visit:  I connected with Adriana Hughes today at [place] by [facility] video conference and verified that I am speaking with the correct person using two identifiers.  I discussed the limitations, risks, security and privacy concerns of performing an evaluation and management service by Webex and the availability of in person appointments.   I also discussed with the patient that there may be a patient responsible charge related to this service. The patient expressed understanding and agreed to proceed.   The patient's address was confirmed.  Identified to the patient that therapist is a licensed SLP in  the state of [place].  Verified phone # as [phone] to call in case of technical difficulties.  Subjective Assessment - 04/15/19 1239    Subjective  Pt was very pleasant and cooperative with all tx tasks, smiling often. Pt was patient during brief disruptions in telehealth due to internet connections.    Currently in Pain?  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 Pt was seen by this SLP from 8/10-11/05/2018 for tx for mod expressive aphasia. Last tx note states "Patient has demonstrated consistent progress on treatment goals, meeting 2/4 goals. Remaining 2 goals were nearly met; however patient continues to require min verbal cues to facilitate divergent naming >7 items and min cues to use compensatory strategies in conversation. Patient is very successful once prompted to use SFA to assist word finding in conversation.  Occasional phonemic & semantic paraphasias present in conversational speech. Ability to self identify and correct phonemic and written paraphasias is markedly improved from evaluation. Patient to be discharged from SLP treatment today with home practice program to continue to increase speed and accuracy  of word finding skills.        Treatment Provided   Treatment provided  Cognitive-Linquistic      Pain Assessment   Pain Assessment  No/denies pain      Cognitive-Linquistic Treatment   Treatment focused on  Aphasia    Skilled Treatment  Patient named at least 3 semantic features to describe pictured objects or objects in home with 100% accuracy independently and 5 semantic features with 100% accuracy given min verbal cues.   Patient described similarities and differences of 2 named items with 100% accuracy given mod  verbal cues, 50% accuracy independently.      Assessment / Recommendations / Plan   Plan  Continue with current plan of care      Progression Toward Goals   Progression toward goals  Progressing toward goals       SLP Education - 04/15/19 1241    Education Details  Compensatory strategies for word finding and short term recall; re: HEP and written materials sent in the mail.    Person(s) Educated  Patient    Methods  Explanation;Demonstration;Handout;Verbal cues    Comprehension  Verbalized understanding;Returned demonstration;Verbal cues required;Need further instruction         SLP Long Term Goals - 04/15/19 1418      SLP LONG TERM GOAL #1   Title  Describe pictured objects by naming 5 semantic features with 80% accuracy    Time  8    Period  Weeks    Status  Revised   Previously 3-5 features; pt can independently name 3 features   Target Date  04/29/19      SLP LONG TERM GOAL #2   Title  Pt will name at least 12 members in a named category with 80% accuracy independently.    Time  8    Period  Weeks    Status  On-going    Target Date  04/29/19      SLP LONG TERM GOAL #3   Title  Pt will compose a novel sentence given 1-2 target words or given pictures with 80% accuracy independently.    Time  8    Period  Weeks    Status  Achieved      SLP LONG TERM GOAL #4   Title  Pt will name semantic features in conversation when experiencing word finding difficulties with 80% accuracy independently.    Time  8    Period  Weeks    Status  On-going    Target Date  04/29/19      SLP LONG TERM GOAL #5   Title  Pt will perform short term recall tasks with 80% accuracy independently.    Time  8    Period  Weeks    Status  On-going    Target Date  04/29/19       Plan - 04/15/19 1244    Clinical Impression Statement  Pt continues to require min verbal cues to describe objects with most effective semantic features: function and category, favoring instead describing less  salient features. Divergent naming skills appear mildly improved, naming up to 8-12 items given min to mod verbal cues. Pt has met 1/5 goals to date. Pt is now practicing her word finding HEP more consistently. SLP continued to stress necessity of regular home practice to improve word finding abilities. Pt will continue to benefit from skilled ST services to regain prior level of independence with functional communication skills.   Speech Therapy Frequency  2x / week   However telehealth only approves 1x/week   Duration  Other (comment)   8 weeks   Treatment/Interventions  SLP instruction and feedback;Functional tasks;Multimodal communcation approach;Internal/external aids;Patient/family education;Compensatory strategies    Potential to Achieve Goals  Good    Potential Considerations  Ability to learn/carryover information;Family/community support;Previous level of function;Cooperation/participation level    SLP Home Exercise Plan  Word finding HEP    Consulted and Agree with Plan of Care  Patient       Patient will benefit from skilled therapeutic intervention in order to improve the following deficits and impairments:   Aphasia - Plan:     Problem List There are no active problems to display for this patient.   Alda, MA, CCC-SLP 04/15/2019, 2:31 PM  [place] MAIN [facility] SERVICES [address] [place], [place], [address] Phone: [phone]   Fax:  [phone]   Name: Adriana Hughes MRN: [id number] Date of Birth: [birth year]

## 2019-04-18 IMAGING — CT CT CHEST W/ CM
2 of 4 series · 15 of 36 positions shown, 18 images · IV contrast (iopamidol)
Comparison: None.

CLINICAL DATA: Pulmonary nodule on CT neck.

EXAM:
CT CHEST WITH CONTRAST
TECHNIQUE: Multidetector CT imaging of the chest was performed during
intravenous contrast administration.
CONTRAST:  75mL [id number] IOPAMIDOL ([id number]) INJECTION 61%

[Series 2: axial chest · axial · 0.60mm/px · z∈[-1251,-997]mm · 12 of 151 slices shown, 15 images]
[im 12/151  mediastinal]
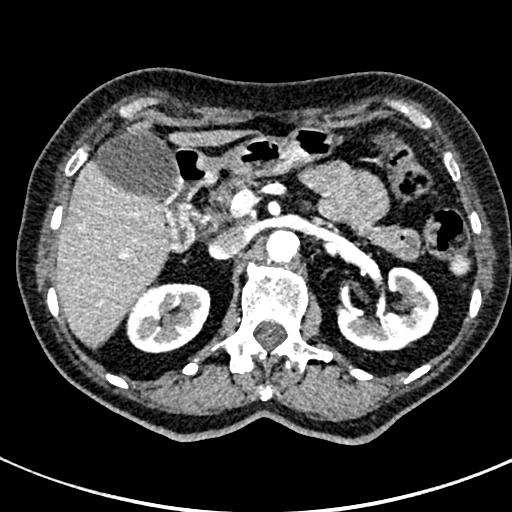
[im 12/151  lung]
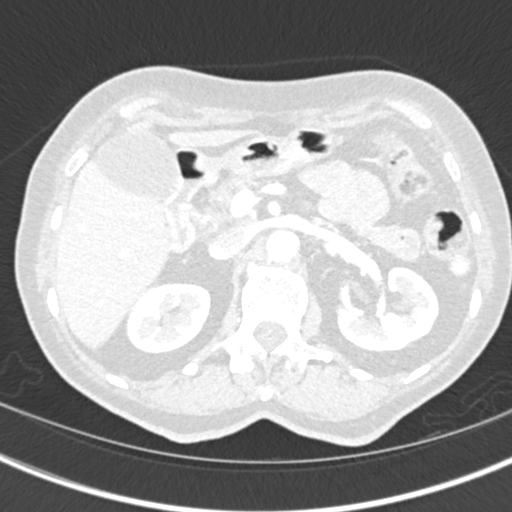
[im 24/151  lung]
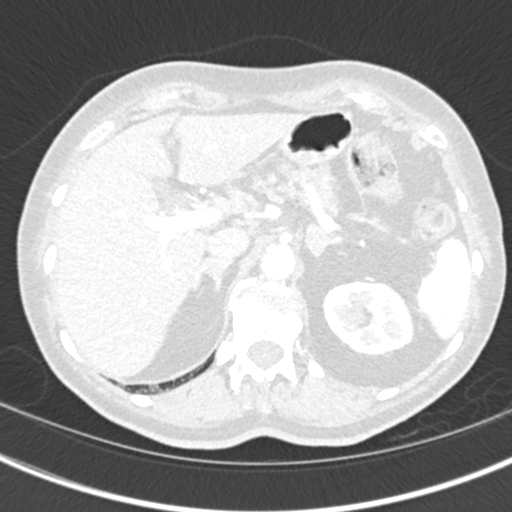
[im 35/151  lung]
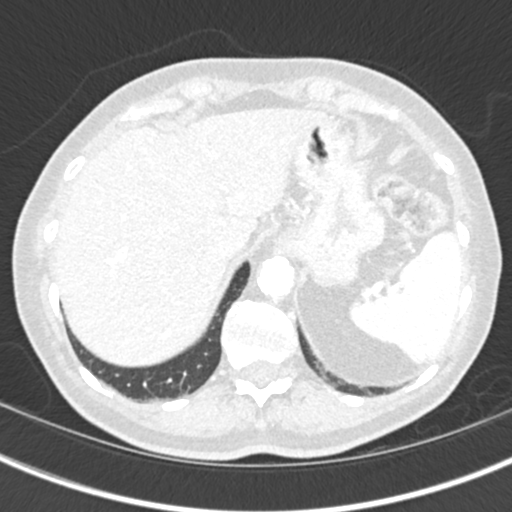
[im 47/151  lung]
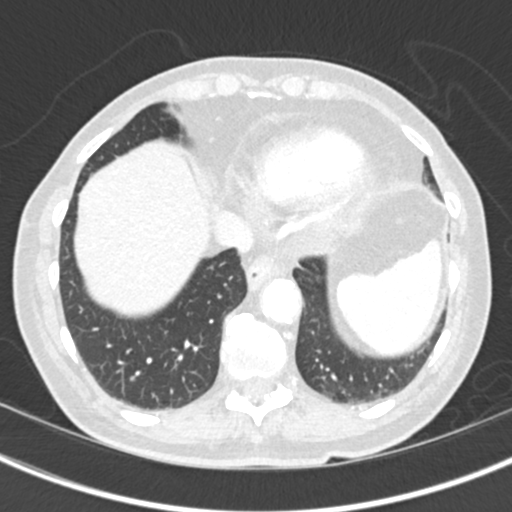
[im 58/151  mediastinal]
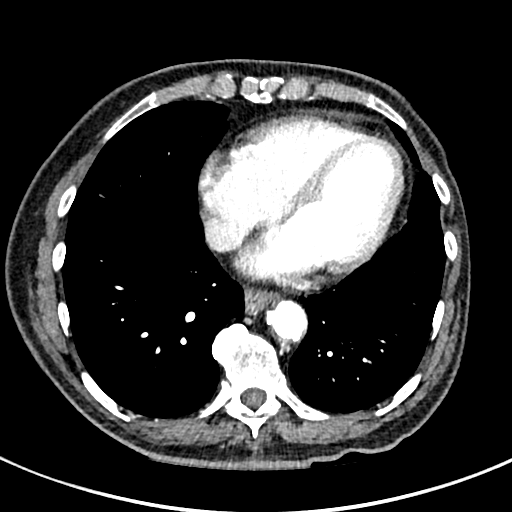
[im 58/151  lung]
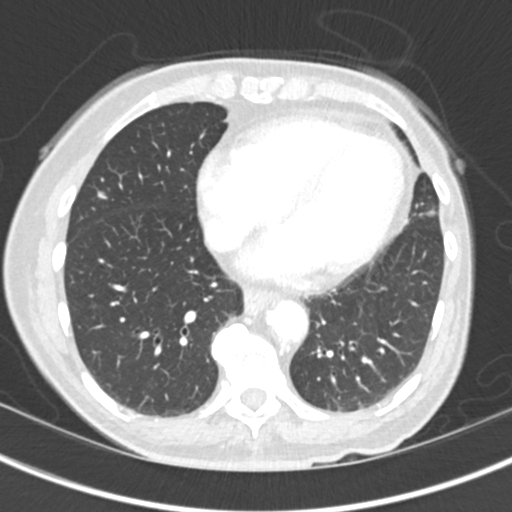
[im 70/151  lung]
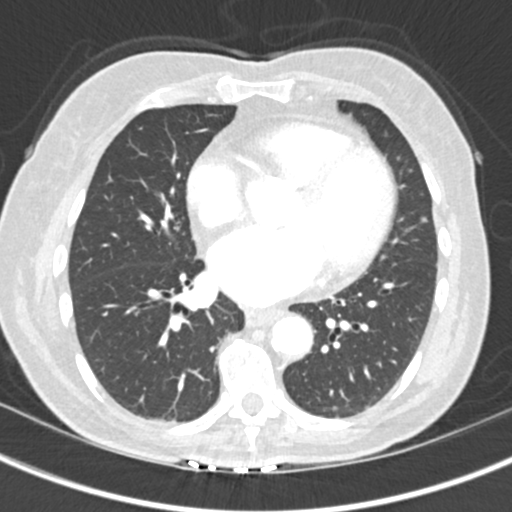
[im 81/151  lung]
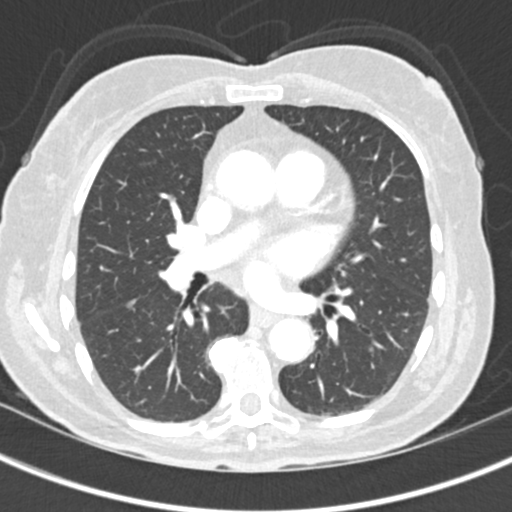
[im 93/151  lung]
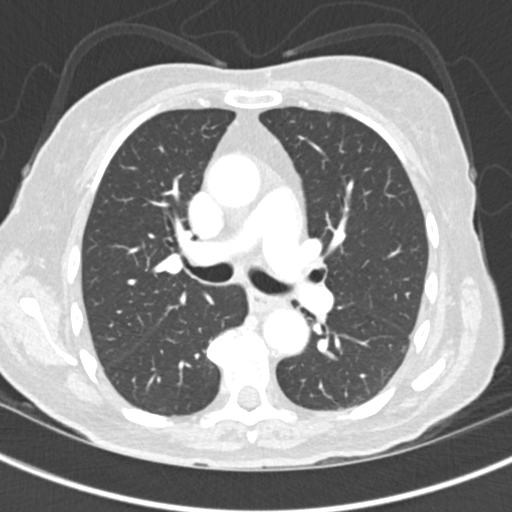
[im 104/151  mediastinal]
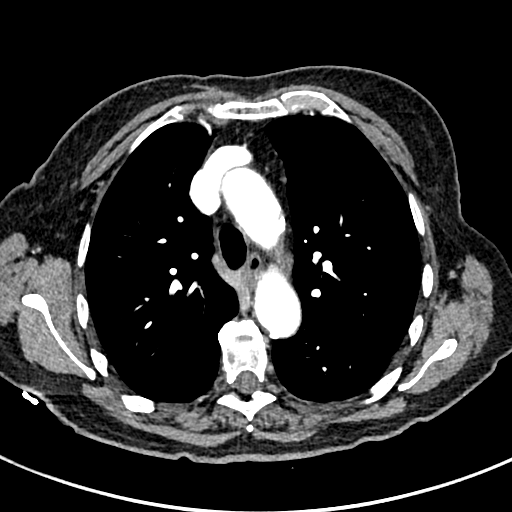
[im 104/151  lung]
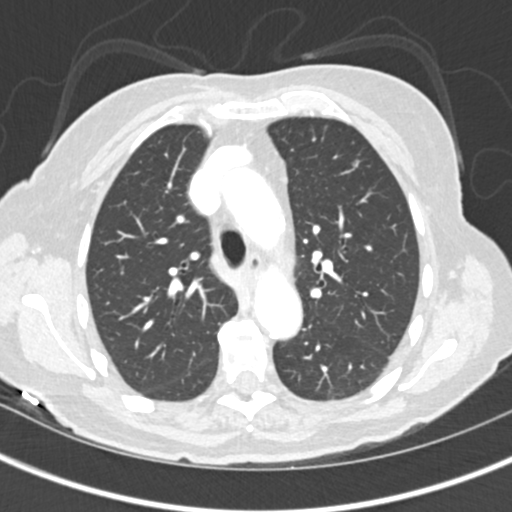
[im 116/151  lung]
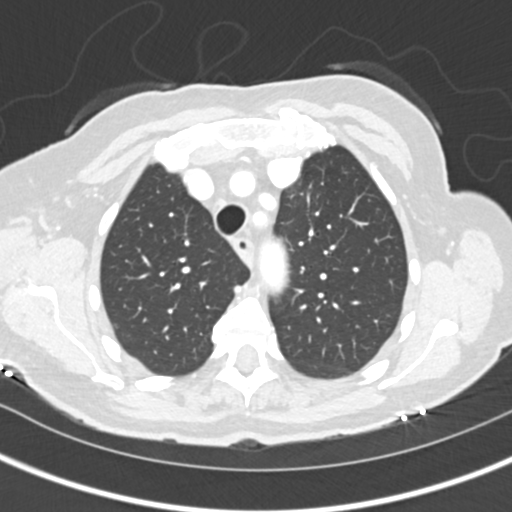
[im 127/151  lung]
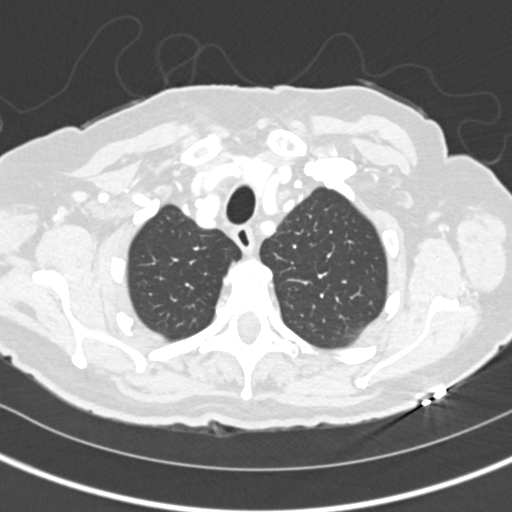
[im 139/151  lung]
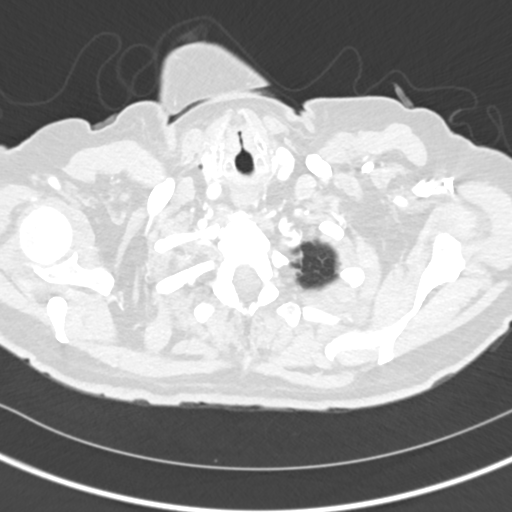

[Series 5: coronal chest · coronal · 0.59mm/px · 3 of 129 slices shown]
[im 26/129  lung]
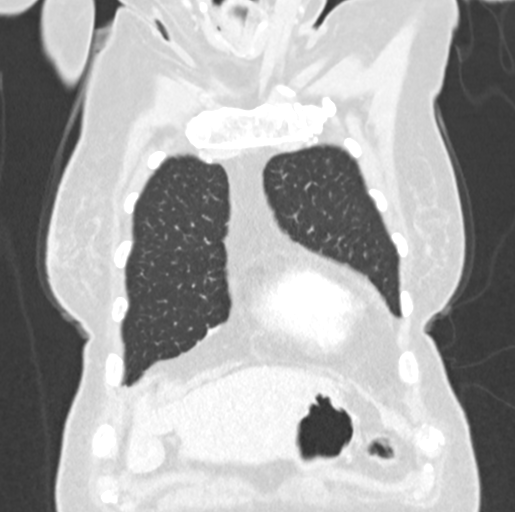
[im 52/129  lung]
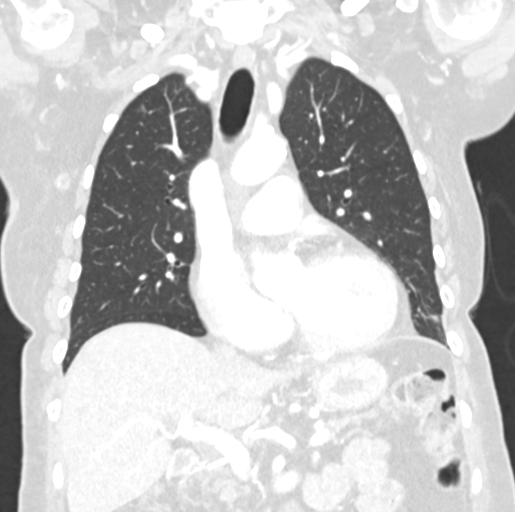
[im 77/129  lung]
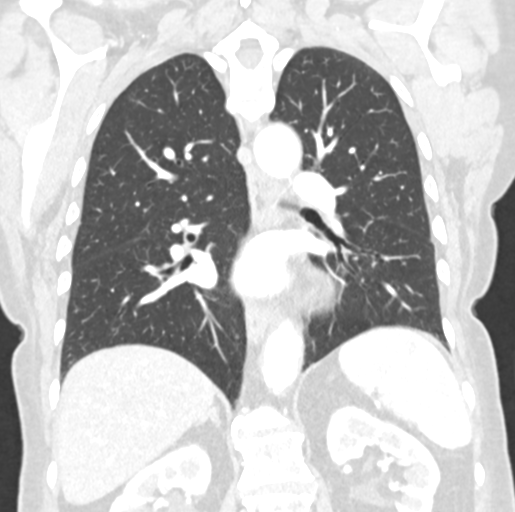

[15 of 36 positions shown; findings below may reference images not displayed]

FINDINGS: Cardiovascular: Atherosclerotic calcification of the arterial
vasculature, including coronary arteries. Right and left pulmonary
arteries and heart are enlarged. No pericardial effusion.

Mediastinum/Nodes: No pathologically enlarged mediastinal, hilar or
axillary lymph nodes. Esophagus is grossly unremarkable.

Lungs/Pleura: Pulmonary nodules measure up to 6 mm in the right
middle lobe. Calcified granuloma in the right upper lobe. No pleural
fluid. Airway is unremarkable.

Upper Abdomen: 7 mm low-attenuation lesion in the right hepatic lobe
is too small to characterize. Liver and gallbladder are
unremarkable. Common bile duct measures up to 9 mm, prominent for
age. Hyperattenuating bilateral adrenal nodules measure 1.3 cm on
the right and 1.8 cm on the left. Visualized portion of the right
kidney is unremarkable. Subcentimeter low-attenuation in the left
kidney is too small to characterize. Visualized portions of the
spleen, pancreas, stomach and bowel are grossly unremarkable. No
upper abdominal adenopathy.

Musculoskeletal: Degenerative changes in the spine. No worrisome
lytic or sclerotic lesions.
IMPRESSION: 1. Multiple bilateral pulmonary nodules measure 6 mm or less size.
Non-contrast chest CT at 3-6 months is recommended. If the nodules
are stable at time of repeat CT, then future CT at 18-24 months
(from today's scan) is considered optional for low-risk patients,
but is recommended for high-risk patients. This recommendation
follows the consensus statement: Guidelines for Management of
Incidental Pulmonary Nodules Detected on CT Images: From the
2. Common bile duct is prominent for age. Please correlate
clinically for right upper quadrant symptoms.
3. Hyperattenuating bilateral adrenal nodules are indeterminate. In
the absence of known malignancy, adenomas are likely. Further
evaluation is desired, MR or CT abdomen without and with contrast is
recommended.
4. Aortic atherosclerosis (9163D-170.0). Coronary artery
calcification.
5. Enlarged pulmonary arteries, indicative of pulmonary arterial
hypertension.

## 2019-04-22 ENCOUNTER — Ambulatory Visit: Payer: Medicare Other | Admitting: Speech Pathology

## 2019-04-22 ENCOUNTER — Other Ambulatory Visit: Payer: Self-pay

## 2019-04-22 DIAGNOSIS — R4701 Aphasia: Secondary | ICD-10-CM

## 2019-04-22 NOTE — Therapy (Signed)
[place] MAIN [facility] SERVICES [address] [place], [place], [address] Phone: [phone]   Fax:  [phone]  Speech Language Pathology Treatment  Patient Details  Name: Adriana Hughes MRN: [id number] Date of Birth: 29-Dec-1940 Referring Provider (SLP): [name], NP   Encounter Date: 04/22/2019  End of Session - 04/22/19 1258    Visit Number  12    Number of Visits  17    Date for SLP Re-Evaluation  04/29/19    Authorization Type  1/10 medicare progress note    SLP Start Time  1011    SLP Stop Time   1103    SLP Time Calculation (min)  52 min    Activity Tolerance  Patient tolerated treatment well       Past Medical History:  Diagnosis Date  . Arthritis   . Fibrocystic breast disease   . GERD (gastroesophageal reflux disease)   . Hyperlipidemia   . Hypertension     Past Surgical History:  Procedure Laterality Date  . APPENDECTOMY    . BREAST BIOPSY    . BREAST EXCISIONAL BIOPSY Bilateral [id number],3300   benign  . EYE SURGERY    . TEE WITHOUT CARDIOVERSION N/A 04/21/2018   Procedure: TRANSESOPHAGEAL ECHOCARDIOGRAM (TEE);  Surgeon: Corey Skains, MD;  Location: [facility] ORS;  Service: Cardiovascular;  Laterality: N/A;    There were no vitals filed for this visit.   Therapy Telehealth Visit:  I connected with Marylu Lund at 10:11 am by [facility] video conference and verified that I am speaking with the correct person using two identifiers.  I discussed the limitations, risks, security and privacy concerns of performing an evaluation and management service by Webex and the availability of in person appointments.   I also discussed with the patient that there may be a patient responsible charge related to this service. The patient expressed understanding and agreed to proceed.   The patient's address was confirmed.  Identified to the patient that therapist is a licensed SLP in the state of [place].  Verified phone # as [phone] to  call in case of technical difficulties.   Subjective Assessment - 04/22/19 1256    Subjective  Pt was very pleasant and cooperative with all tx tasks, smiling often.    Currently in Pain?  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 Pt was seen by this SLP from 8/10-11/05/2018 for tx for mod expressive aphasia. Last tx note states "Patient has demonstrated consistent progress on treatment goals, meeting 2/4 goals. Remaining 2 goals were nearly met; however patient continues to require min verbal cues to facilitate divergent naming >7 items and min cues to use compensatory strategies in conversation. Patient is very successful once prompted to use SFA to assist word finding in conversation.  Occasional phonemic & semantic paraphasias present in conversational speech. Ability to self identify and correct phonemic and written paraphasias is markedly improved from evaluation. Patient to be discharged from SLP treatment today with home practice program to continue to increase speed and accuracy of word finding skills.        Treatment Provided   Treatment provided  Cognitive-Linquistic  Pain Assessment   Pain Assessment  No/denies pain      Cognitive-Linquistic Treatment   Treatment focused on  Aphasia    Skilled Treatment Patient named at least 3 semantic features to describe pictured objects or objects in home with 100% accuracy independently and 5 semantic features with 100% accuracy given min verbal cues.   Patient described similarities and differences of 2 named items with 100% accuracy given min to mod verbal cues, 50% accuracy independently.     Assessment / Recommendations / Plan   Plan  Continue with current plan of care       Progression Toward Goals   Progression toward goals  Progressing toward goals       SLP Education - 04/22/19 1257    Education Details  Compensatory strategies for word finding and short term recall, Expressive Aphasia HEP    Person(s) Educated  Patient    Methods  Explanation;Demonstration;Verbal cues    Comprehension  Verbalized understanding;Need further instruction;Returned demonstration;Verbal cues required         SLP Long Term Goals - 04/15/19 1418      SLP LONG TERM GOAL #1   Title  Describe pictured objects by naming 5 semantic features with 80% accuracy    Time  8    Period  Weeks    Status  Revised   Previously 3-5 features; pt can independently name 3 features   Target Date  04/29/19      SLP LONG TERM GOAL #2   Title  Pt will name at least 12 members in a named category with 80% accuracy independently.    Time  8    Period  Weeks    Status  On-going    Target Date  04/29/19      SLP LONG TERM GOAL #3   Title  Pt will compose a novel sentence given 1-2 target words or given pictures with 80% accuracy independently.    Time  8    Period  Weeks    Status  Achieved      SLP LONG TERM GOAL #4   Title  Pt will name semantic features in conversation when experiencing word finding difficulties with 80% accuracy independently.    Time  8    Period  Weeks    Status  On-going    Target Date  04/29/19      SLP LONG TERM GOAL #5   Title  Pt will perform short term recall tasks with 80% accuracy independently.    Time  8    Period  Weeks    Status  On-going    Target Date  04/29/19       Plan - 04/22/19 1258    Clinical Impression Statement  Pt continues to require min to mod verbal cues to describe objects, but demonstrated improved naming shared semantic features (similarities) given 2 target items.  Pt is now practicing her word finding HEP more consistently, she reports she continues to have the most difficulty with synonym & antonym tasks. SLP continued  to stress necessity of regular home practice to improve word finding abilities. Pt will continue to benefit from skilled ST services to regain prior level of independence with functional communication skills.   Speech Therapy Frequency  2x / week    Duration  Other (comment)    Treatment/Interventions  SLP instruction and feedback;Functional tasks;Multimodal communcation approach;Internal/external aids;Patient/family education;Compensatory strategies    Potential to Achieve Goals  Good    Potential  Considerations  Ability to learn/carryover information;Family/community support;Previous level of function;Cooperation/participation level    SLP Home Exercise Plan  Word finding HEP    Consulted and Agree with Plan of Care  Patient       Patient will benefit from skilled therapeutic intervention in order to improve the following deficits and impairments:   1. Aphasia       Problem List There are no active problems to display for this patient.   [place], MA, CCC-SLP 04/22/2019, 2:05 PM  [place] MAIN [facility] SERVICES [address] [place], [place], [address] Phone: [phone]   Fax:  [phone]   Name: SINIA ANTOSH MRN: [id number] Date of Birth: 1941-08-29

## 2019-04-25 IMAGING — MG DIGITAL SCREENING BILATERAL MAMMOGRAM WITH TOMO AND CAD
2 series · 2 of 2 positions shown · non-contrast
Comparison: Previous exam(s).

CLINICAL DATA: Screening.

EXAM:
DIGITAL SCREENING BILATERAL MAMMOGRAM WITH TOMO AND CAD

[R MLO synth-2D]
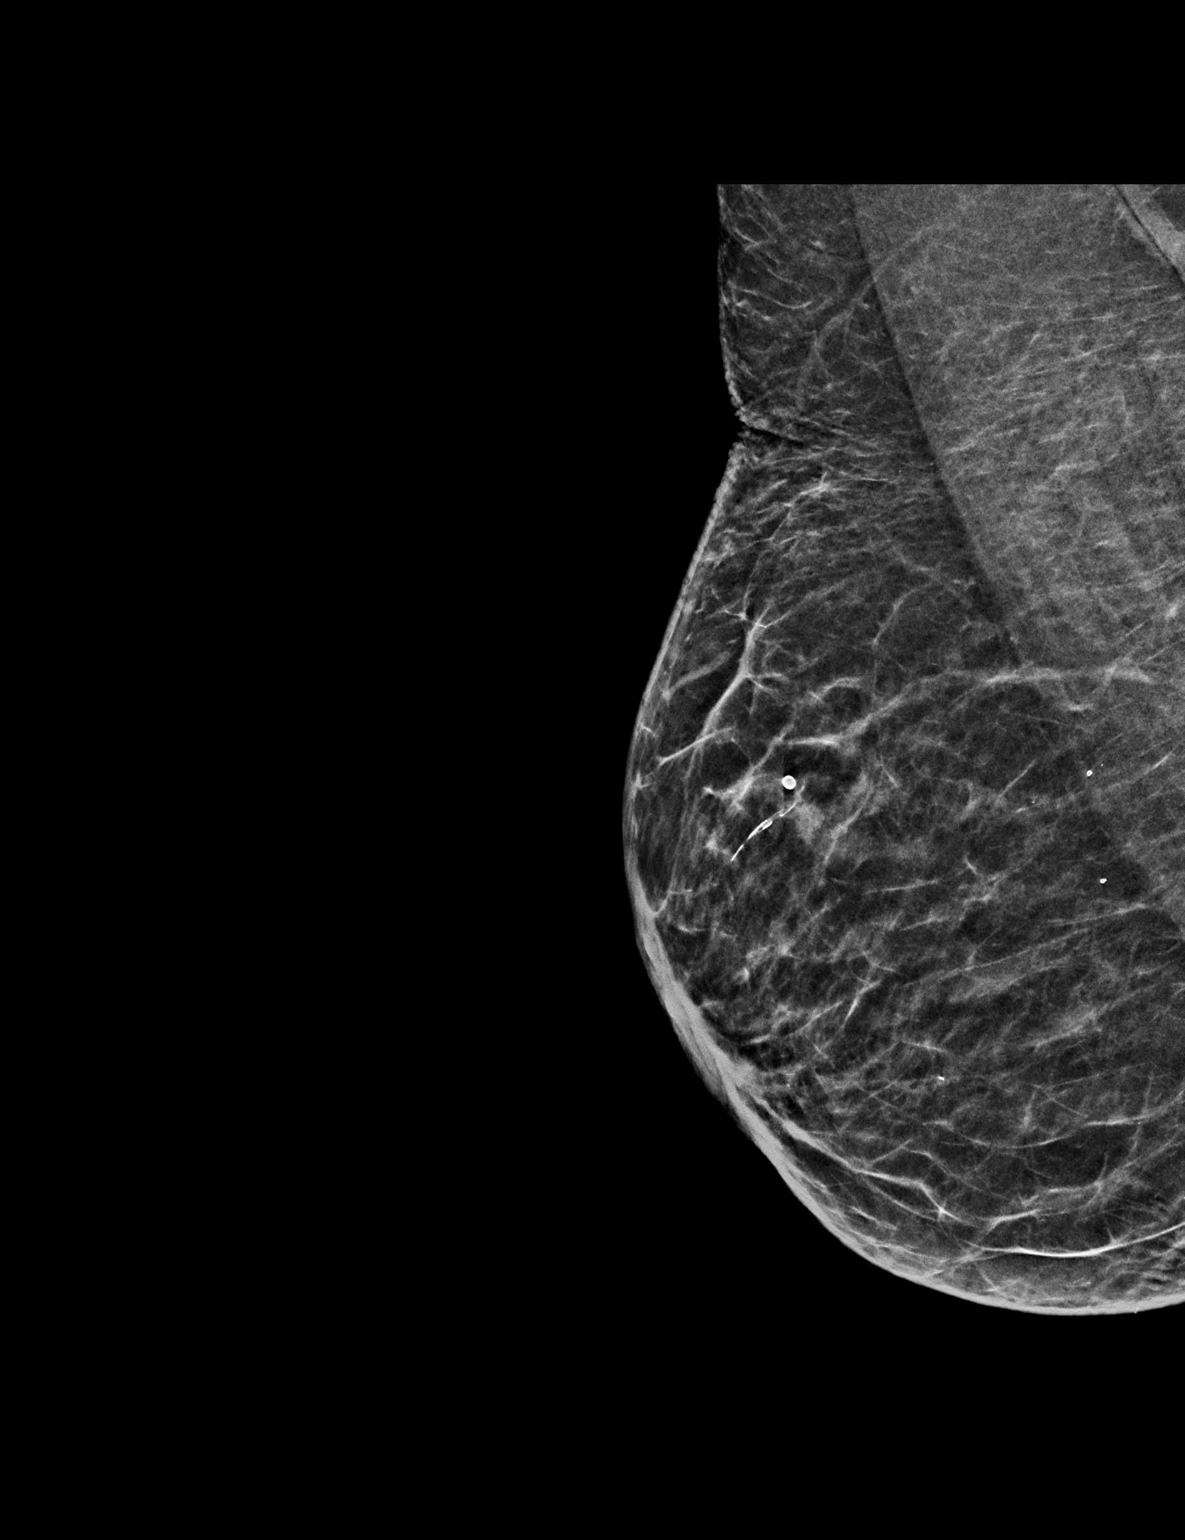

[L MLO synth-2D]
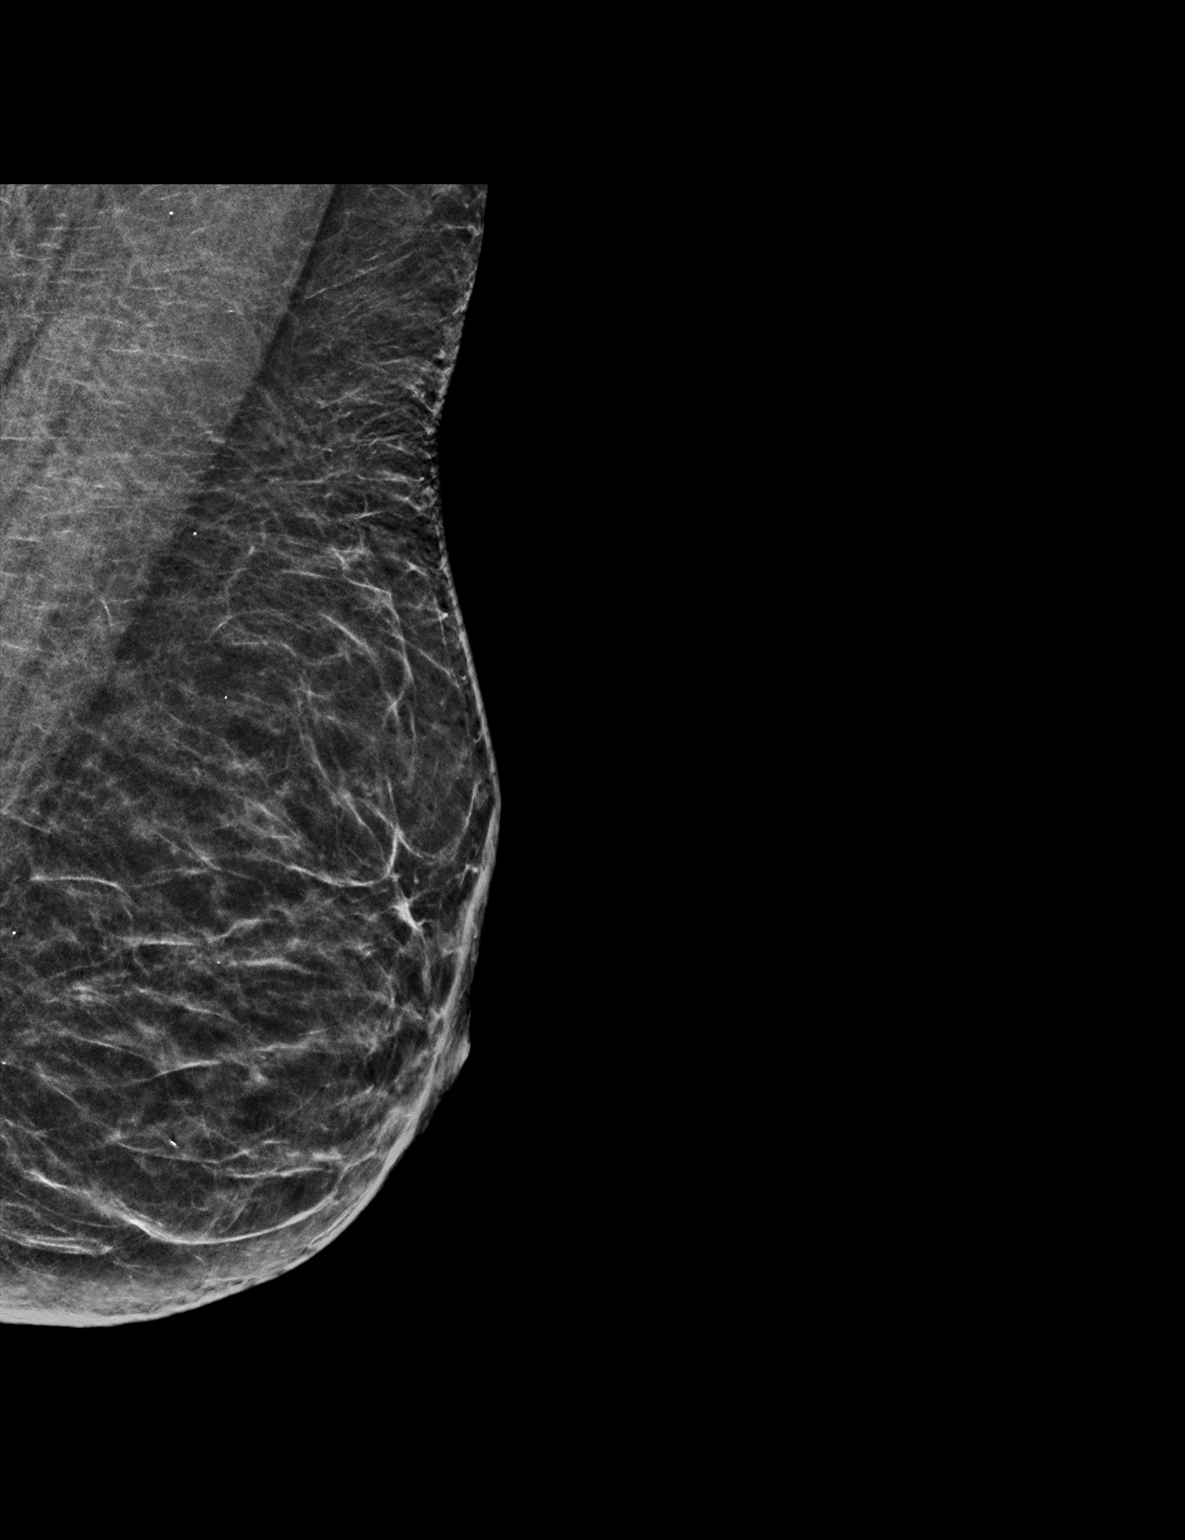

[2 of 2 positions shown; findings below may reference images not displayed]

ACR Breast Density Category b: There are scattered areas of
fibroglandular density.
FINDINGS: There are no findings suspicious for malignancy. Images were
processed with CAD.
IMPRESSION: No mammographic evidence of malignancy. A result letter of this
screening mammogram will be mailed directly to the patient.

RECOMMENDATION:
Screening mammogram in one year. (Code:[id number])

BI-RADS CATEGORY  1: Negative.

## 2019-04-29 ENCOUNTER — Ambulatory Visit: Payer: Medicare Other | Admitting: Speech Pathology

## 2019-04-29 ENCOUNTER — Other Ambulatory Visit: Payer: Self-pay

## 2019-04-29 ENCOUNTER — Encounter: Payer: Self-pay | Admitting: Speech Pathology

## 2019-04-29 DIAGNOSIS — R4701 Aphasia: Secondary | ICD-10-CM

## 2019-04-29 NOTE — Therapy (Signed)
[place] MAIN [facility] SERVICES [address] [place], [place], [address] Phone: [phone]   Fax:  [phone]  Speech Language Pathology Treatment  Patient Details  Name: Adriana Hughes MRN: [id number] Date of Birth: 1941/01/19 Referring Provider: [name], NP   Encounter Date: 04/29/2019  End of Session - 04/29/19 1252    Visit Number  13    Number of Visits  17    Date for SLP Re-Evaluation  06/03/19    Authorization Type  2/10 medicare progress note    Authorization Time Period  Will submit re-cert today, pt currently being seen 1x week rather than the rec'd 2x/week due to recieving tx via telehealth due to COVID-19. Only 1 visit a week currently approved for telehealth. Pt will require 4-6 weeks to meet goals.   SLP Start Time  1100    SLP Stop Time   1155    SLP Time Calculation (min)  55 min    Activity Tolerance  Patient tolerated treatment well       Past Medical History:  Diagnosis Date  . Arthritis   . Fibrocystic breast disease   . GERD (gastroesophageal reflux disease)   . Hyperlipidemia   . Hypertension     Past Surgical History:  Procedure Laterality Date  . APPENDECTOMY    . BREAST BIOPSY    . BREAST EXCISIONAL BIOPSY Bilateral [id number]   benign  . EYE SURGERY    . TEE WITHOUT CARDIOVERSION N/A 04/21/2018   Procedure: TRANSESOPHAGEAL ECHOCARDIOGRAM (TEE);  Surgeon: Corey Skains, MD;  Location: [facility] ORS;  Service: Cardiovascular;  Laterality: N/A;    There were no vitals filed for this visit.   Therapy Telehealth Visit:  I connected with Adriana Hughes today at [place] by [facility] video conference and verified that I am speaking with the correct person using two identifiers.  I discussed the limitations, risks, security and privacy concerns of performing an evaluation and management service by Webex and the availability of in person appointments.   I also discussed with the patient that there may be a patient  responsible charge related to this service. The patient expressed understanding and agreed to proceed.   The patient's address was confirmed.  Identified to the patient that therapist is a licensed SLP in the state of [place].  Verified phone # as [phone] to call in case of technical difficulties.   Subjective Assessment - 04/29/19 1250    Subjective  Pt was very pleasant and cooperative with all tx tasks, smiling often.    Currently in Pain?  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 Pt was seen by this SLP from 8/10-11/05/2018 for tx for mod expressive aphasia. Last tx note states "Patient has demonstrated consistent progress on treatment goals, meeting 2/4 goals. Remaining 2 goals were nearly met; however patient continues to require min verbal cues to facilitate divergent naming >7 items and min cues to use compensatory strategies in conversation. Patient is very successful once prompted to use SFA to assist word finding in conversation.  Occasional phonemic & semantic paraphasias present in conversational speech. Ability to self identify and correct phonemic and written  paraphasias is markedly improved from evaluation. Patient to be discharged from SLP treatment today with home practice program to continue to increase speed and accuracy of word finding skills.        Treatment Provided   Treatment provided  Cognitive-Linquistic      Pain Assessment   Pain Assessment  No/denies pain      Cognitive-Linquistic Treatment   Treatment focused on  Aphasia    Skilled Treatment  Patient named 3 out of 5 semantic features given pictured objects with 100% accuracy independently, and named 5 given min verbal cues. Pt named semantic  features to describe item in house to enable SLP to name object in patient's home with 100% accuracy given occasional min verbal cues. Patient named an average of 5 items in a named category independently and between 8-10 given mod verbal cues. Pt named matched rhyming words given mod verbal cues with 100%accuracy, and named 3 rhyming words given a target word with 100% accuracy given mod verbal and written cues. Patient spelled four letter words in forward and then reverse order with 70% accuracy.       Assessment / Recommendations / Plan   Plan  Continue with current plan of care      Progression Toward Goals   Progression toward goals  Progressing toward goals       SLP Education - 04/29/19 1251    Education Details  Compensatory strategies for word finding and short term recal, Expressive Aphasia HEP    Person(s) Educated  Patient    Methods  Explanation;Demonstration;Verbal cues    Comprehension  Verbalized understanding;Need further instruction;Returned demonstration;Verbal cues required         SLP Long Term Goals - 04/29/19 1310      SLP LONG TERM GOAL #1   Title  Describe pictured objects by naming 5 semantic features with 80% accuracy    Baseline  Patient consistently names 3 semantic features independently with 100% accuracy, requires min verbal cues to name 5 and to consistently name most salient features: category & function    Time  6    Period  Weeks    Status  On-going    Target Date  06/03/19      SLP LONG TERM GOAL #2   Title  Pt will name at least 12 members in a named category with 80% accuracy independently.    Baseline  Patient continues to require min to mod verbal cues to name 10 items in a given category, independently =40-60% accuracy    Time  6    Period  Weeks    Status  On-going    Target Date  06/03/19      SLP LONG TERM GOAL #3   Title  Pt will compose a novel sentence given 1-2 target words or given pictures with 80% accuracy independently.     Baseline  Patient composes a novel sentence when given a picture with 90% independently, when given 1 target word with 90% with min cues.    Time  8    Period  Weeks    Status  Achieved      SLP LONG TERM GOAL #4   Title  Pt will name semantic features in conversation when experiencing word finding difficulties with 80% accuracy independently.    Baseline  Requires min verbal cues    Time  6    Status  On-going    Target Date  06/03/19      SLP LONG  TERM GOAL #5   Title  Pt will perform short term recall tasks with 80% accuracy independently.    Time  6    Period  Weeks    Status  On-going    Target Date  06/03/19      Additional Long Term Goals   Additional Long Term Goals  Yes      SLP LONG TERM GOAL #6   Title  Pt will name 3 similarities and 3 differences given named objects with 80% accuracy independently.    Time  6    Period  Weeks    Status  New       Plan - 04/29/19 1316    Clinical Impression Statement  Pt demonstrated improved independence and accuracy naming semantic features to enable SLP to guess object in patient's home today, requiring a decreased level of cueing. Pt reported consistent practice of her Expressive Aphasia HEP this past week. Pt near to goal completion; however will require re-certification due to need for more visits to meet goals, as a decreased number of visits performed per week due to telehealth insurance restrictions to 1 visit a week. Rec. 4-6 more visits to meet goals and improve functional communication skills. Pt will continue to benefit from skilled ST services to regain prior level of independence with functional communication skills.   Speech Therapy Frequency  2x / week    Duration  Other (comment)   4-6 weeks   Treatment/Interventions  SLP instruction and feedback;Functional tasks;Multimodal communcation approach;Internal/external aids;Patient/family education;Compensatory strategies    Potential to Achieve Goals  Good    Potential  Considerations  Ability to learn/carryover information;Family/community support;Previous level of function;Cooperation/participation level    SLP Home Exercise Plan  Word finding HEP    Consulted and Agree with Plan of Care  Patient       Patient will benefit from skilled therapeutic intervention in order to improve the following deficits and impairments:   1. Aphasia   2. Expressive aphasia       Problem List There are no active problems to display for this patient.   [place], MA, CCC-SLP 04/29/2019, 1:22 PM  [place] MAIN [facility] SERVICES [address] [place], [place], [address] Phone: [phone]   Fax:  [phone]   Name: Adriana Hughes MRN: [id number] Date of Birth: 01/03/1941

## 2019-05-06 ENCOUNTER — Encounter: Payer: Self-pay | Admitting: Speech Pathology

## 2019-05-06 ENCOUNTER — Ambulatory Visit: Payer: Medicare Other | Attending: Psychiatry | Admitting: Speech Pathology

## 2019-05-06 ENCOUNTER — Other Ambulatory Visit: Payer: Self-pay

## 2019-05-06 DIAGNOSIS — R4701 Aphasia: Secondary | ICD-10-CM | POA: Diagnosis present

## 2019-05-06 NOTE — Therapy (Signed)
[place] MAIN [facility] SERVICES [address] [place], [place], [address] Phone: [phone]   Fax:  [phone]  Speech Language Pathology Treatment  Patient Details  Name: Adriana Hughes MRN: [id number] Date of Birth: 25-Sep-1941 Referring Provider: [name], NP   Encounter Date: 05/06/2019  End of Session - 05/06/19 1447    Visit Number  14    Number of Visits  17    Date for SLP Re-Evaluation  06/03/19    Authorization Type  3/10 medicare progress note    SLP Start Time  1100    SLP Stop Time   1155    SLP Time Calculation (min)  55 min    Activity Tolerance  Patient tolerated treatment well       Past Medical History:  Diagnosis Date  . Arthritis   . Fibrocystic breast disease   . GERD (gastroesophageal reflux disease)   . Hyperlipidemia   . Hypertension     Past Surgical History:  Procedure Laterality Date  . APPENDECTOMY    . BREAST BIOPSY    . BREAST EXCISIONAL BIOPSY Bilateral 2575,0518   benign  . EYE SURGERY    . TEE WITHOUT CARDIOVERSION N/A 04/21/2018   Procedure: TRANSESOPHAGEAL ECHOCARDIOGRAM (TEE);  Surgeon: Corey Skains, MD;  Location: [facility] ORS;  Service: Cardiovascular;  Laterality: N/A;    There were no vitals filed for this visit.   Therapy Telehealth Visit:  I connected with Adriana Hughes today at 11 am by [facility] video conference and verified that I am speaking with the correct person using two identifiers.  I discussed the limitations, risks, security and privacy concerns of performing an evaluation and management service by Webex and the availability of in person appointments.   I also discussed with the patient that there may be a patient responsible charge related to this service. The patient expressed understanding and agreed to proceed.   The patient's address was confirmed.  Identified to the patient that therapist is a licensed SLP in the state of [place].  Verified phone # as [phone] to call  in case of technical difficulties.   Subjective Assessment - 05/06/19 1446    Subjective  Pt was very pleasant and cooperative with all tx tasks, smiling often.    Currently in Pain?  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 Pt was seen by this SLP from 8/10-11/05/2018 for tx for mod expressive aphasia. Last tx note states "Patient has demonstrated consistent progress on treatment goals, meeting 2/4 goals. Remaining 2 goals were nearly met; however patient continues to require min verbal cues to facilitate divergent naming >7 items and min cues to use compensatory strategies in conversation. Patient is very successful once prompted to use SFA to assist word finding in conversation.  Occasional phonemic & semantic paraphasias present in conversational speech. Ability to self identify and correct phonemic and written paraphasias is markedly improved from evaluation. Patient to be discharged from SLP treatment today with home practice program to continue to increase speed and accuracy of word finding skills.        Treatment Provided   Treatment provided  Cognitive-Linquistic  Pain Assessment   Pain Assessment  No/denies pain      Cognitive-Linquistic Treatment   Treatment focused on  Aphasia    Skilled Treatment  On average, pt named 3 out of 5 semantic features given pictured objects with 100% accuracy given visual cues through use of SFA chart, and named 5 given min verbal cues from SLP. Pt named semantic features to describe item in house to enable SLP to name object in patient's home with 66% accuracy independently and 100% given min verbal cues.  Pt named 4 rhyming words given a target word with 100% accuracy given mod verbal  and written cues, pt requires written cue of alphabet to name rhyming words with accuracy     Patient described similarities and differences of 2 named items with 100% accuracy given mod verbal cues, 50% accuracy independently.      Assessment / Recommendations / Plan   Plan  Continue with current plan of care      Progression Toward Goals   Progression toward goals  Progressing toward goals       SLP Education - 05/06/19 1447    Education Details  Compensatory strategies for word finding and short term recall, Expressive Aphasia HEP    Person(s) Educated  Patient    Methods  Explanation;Demonstration;Verbal cues    Comprehension  Verbalized understanding;Need further instruction;Returned demonstration;Verbal cues required         SLP Long Term Goals - 04/29/19 1310      SLP LONG TERM GOAL #1   Title  Describe pictured objects by naming 5 semantic features with 80% accuracy    Baseline  Patient consistently names 3 semantic features independently with 100% accuracy, requires min verbal cues to name 5 and to consistently name most salient features: category & function    Time  6    Period  Weeks    Status  On-going    Target Date  06/03/19      SLP LONG TERM GOAL #2   Title  Pt will name at least 12 members in a named category with 80% accuracy independently.    Baseline  Patient continues to require min to mod verbal cues to name 10 items in a given category, independently =40-60% accuracy    Time  6    Period  Weeks    Status  On-going    Target Date  06/03/19      SLP LONG TERM GOAL #3   Title  Pt will compose a novel sentence given 1-2 target words or given pictures with 80% accuracy independently.    Baseline  Patient composes a novel sentence when given a picture with 90% independently, when given 1 target word with 90% with min cues.    Time  8    Period  Weeks    Status  Achieved      SLP LONG TERM GOAL #4   Title  Pt will name semantic features in  conversation when experiencing word finding difficulties with 80% accuracy independently.    Baseline  Requires min verbal cues    Time  6    Status  On-going    Target Date  06/03/19      SLP LONG TERM GOAL #5   Title  Pt will perform short term recall tasks with 80% accuracy independently.    Time  6    Period  Weeks    Status  On-going    Target Date  06/03/19  Additional Long Term Goals   Additional Long Term Goals  Yes      SLP LONG TERM GOAL #6   Title  Pt will name 3 similarities and 3 differences given named objects with 80% accuracy independently.    Time  6    Period  Weeks    Status  New       Plan - 05/06/19 1448    Clinical Impression Statement  Pt demonstrated decreased independence on nearly all tx tasks this session requiring an increased level of cueing. Pt also reported increased difficulty writing. She says she prefers not to write as she has difficulty keeping her words level on a line. SLP strongly encouraged pt to continue to write responses on homework to assist word retrieval and organization. Pt reported consistent practice of her Expressive Aphasia HEP this past week. Pt will continue to benefit from skilled ST services to regain prior level of independence with functional communication skills.   Speech Therapy Frequency  1x /week   rec 2x/week; however telehealth approved x1/week   Duration  Other (comment)   4 weeks   Treatment/Interventions  SLP instruction and feedback;Functional tasks;Multimodal communcation approach;Internal/external aids;Patient/family education;Compensatory strategies    Potential Considerations  Ability to learn/carryover information;Family/community support;Previous level of function;Cooperation/participation level    SLP Home Exercise Plan  Word finding HEP    Consulted and Agree with Plan of Care  Patient       Patient will benefit from skilled therapeutic intervention in order to improve the following deficits and  impairments:   1. Aphasia       Problem List There are no active problems to display for this patient.   [place], MA, CCC-SLP 05/06/2019, 3:07 PM  [place] MAIN [facility] SERVICES [address] [place], [place], [address] Phone: [phone]   Fax:  [phone]   Name: Adriana Hughes MRN: [id number] Date of Birth: Sep 16, 1941

## 2019-05-13 ENCOUNTER — Ambulatory Visit: Payer: Medicare Other | Admitting: Speech Pathology

## 2019-05-20 ENCOUNTER — Other Ambulatory Visit: Payer: Self-pay

## 2019-05-20 ENCOUNTER — Encounter: Payer: Self-pay | Admitting: Speech Pathology

## 2019-05-20 ENCOUNTER — Ambulatory Visit: Payer: Medicare Other | Admitting: Speech Pathology

## 2019-05-20 DIAGNOSIS — R4701 Aphasia: Secondary | ICD-10-CM

## 2019-05-20 NOTE — Therapy (Signed)
[place] MA[place] [facility] SERVICES [address] [place], [place], [address] Phone: [phone]   Fax:  [phone]  Speech Language Pathology Treatment  Patient Details  Name: Adriana Hughes MRN: [id number] Date of Birth: 1941/01/28 Referring Provider: [name], NP   Encounter Date: 05/20/2019  End of Session - 05/20/19 1054    Visit Number  15    Number of Visits  17    Date for SLP Re-Evaluation  06/03/19    Authorization Type  4/10 medicare progress note    SLP Start Time  1002    SLP Stop Time   1051    SLP Time Calculation (min)  49 min    Activity Tolerance  Patient tolerated treatment well       Past Medical History:  Diagnosis Date  . Arthritis   . Fibrocystic breast disease   . GERD (gastroesophageal reflux disease)   . Hyperlipidemia   . Hypertension     Past Surgical History:  Procedure Laterality Date  . APPENDECTOMY    . BREAST BIOPSY    . BREAST EXCISIONAL BIOPSY Bilateral 1410,3013   benign  . EYE SURGERY    . TEE WITHOUT CARDIOVERSION N/A 04/21/2018   Procedure: TRANSESOPHAGEAL ECHOCARDIOGRAM (TEE);  Surgeon: Corey Skains, MD;  Location: [facility] ORS;  Service: Cardiovascular;  Laterality: N/A;    There were no vitals filed for this visit.   Therapy Telehealth Visit:  I connected with Adriana Hughes today at 10:02 am by [facility] video conference and verified that I am speaking with the correct person using two identifiers.  I discussed the limitations, risks, security and privacy concerns of performing an evaluation and management service by Webex and the availability of in person appointments.   I also discussed with the patient that there may be a patient responsible charge related to this service. The patient expressed understanding and agreed to proceed.   The patient's address was confirmed.  Identified to the patient that therapist is a licensed SLP in the state of [place].  Verified phone # as [phone] to  call in case of technical difficulties.   Subjective Assessment - 05/20/19 1051    Subjective  Pt was very pleasant and cooperative with all tx tasks, smiling often. She reported she has used her word finding strategies to help her find her words in conversation about 50% of the time.    Currently in Pain?  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 Pt was seen by this SLP from 8/10-11/05/2018 for tx for mod expressive aphasia. Last tx note states "Patient has demonstrated consistent progress on treatment goals, meeting 2/4 goals. Remaining 2 goals were nearly met; however patient continues to require min verbal cues to facilitate divergent naming >7 items and min cues to use compensatory strategies in conversation. Patient is very successful once prompted to use SFA to assist word finding in conversation.  Occasional phonemic & semantic paraphasias present in conversational speech. Ability to self identify and correct phonemic and written paraphasias is markedly improved from evaluation. Patient to be discharged from SLP treatment today with home practice program to continue to increase speed and  accuracy of word finding skills.        Treatment Provided   Treatment provided  Cognitive-Linquistic      Pain Assessment   Pain Assessment  No/denies pain      Cognitive-Linquistic Treatment   Treatment focused on  Aphasia    Skilled Treatment  On average, pt named 3 /5 semantic features independently given pictured objects with 100% accuracy, 4/5 semantic features given visual cues through use of SFA chart, and named 5-6 given min verbal cues from SLP.  Patient described similarities and differences of 2 named items with 100% accuracy given  mod verbal cues, 65% accuracy independently. Patient named 8 members in a named category with 100% accuracy given min to mod verbal cues (named 5-6 independently depending on the category named).       Assessment / Recommendations / Plan   Plan  Continue with current plan of care      Progression Toward Goals   Progression toward goals  Progressing toward goals       SLP Education - 05/20/19 1053    Education Details  Compensatory strategies for word finding and short term recall & re: expressive aphasia HEP    Person(s) Educated  Patient    Methods  Explanation;Demonstration;Verbal cues;Handout    Comprehension  Verbalized understanding;Need further instruction;Verbal cues required;Returned demonstration         SLP Long Term Goals - 04/29/19 1310      SLP LONG TERM GOAL #1   Title  Describe pictured objects by naming 5 semantic features with 80% accuracy    Baseline  Patient consistently names 3 semantic features independently with 100% accuracy, requires min verbal cues to name 5 and to consistently name most salient features: category & function    Time  6    Period  Weeks    Status  On-going    Target Date  06/03/19      SLP LONG TERM GOAL #2   Title  Pt will name at least 12 members in a named category with 80% accuracy independently.    Baseline  Patient continues to require min to mod verbal cues to name 10 items in a given category, independently =40-60% accuracy    Time  6    Period  Weeks    Status  On-going    Target Date  06/03/19      SLP LONG TERM GOAL #3   Title  Pt will compose a novel sentence given 1-2 target words or given pictures with 80% accuracy independently.    Baseline  Patient composes a novel sentence when given a picture with 90% independently, when given 1 target word with 90% with min cues.    Time  8    Period  Weeks    Status  Achieved      SLP LONG TERM GOAL #4   Title  Pt will name semantic features in conversation when experiencing  word finding difficulties with 80% accuracy independently.    Baseline  Requires min verbal cues    Time  6    Status  On-going    Target Date  06/03/19      SLP LONG TERM GOAL #5   Title  Pt will perform short term recall tasks with 80% accuracy independently.    Time  6    Period  Weeks    Status  On-going    Target Date  06/03/19      Additional Long Term Goals  Additional Long Term Goals  Yes      SLP LONG TERM GOAL #6   Title  Pt will name 3 similarities and 3 differences given named objects with 80% accuracy independently.    Time  6    Period  Weeks    Status  New       Plan - 05/20/19 1055    Clinical Impression Statement Pt demonstrated emerging independence using SFA to assist word finding when given pictured objects and in describing similarities and differences btwn 2 named items. Pt reported consistent practice of her Expressive Aphasia HEP this past week. Last week's telehealth tx session was cancelled due to poor internet connection, & next week's will be cancelled due to therapist unavailable. Will need to submit re-certification soon to meet tx goals. Pt will continue to benefit from skilled ST services to regain prior level of independence with functional communication skills.   Speech Therapy Frequency  1x /week   rec 2x/week, 1x/week telehealth approved Medicare   Duration  4 weeks    Treatment/Interventions  SLP instruction and feedback;Functional tasks;Multimodal communcation approach;Internal/external aids;Patient/family education;Compensatory strategies    Potential to Achieve Goals  Good    Potential Considerations  Ability to learn/carryover information;Family/community support;Previous level of function;Cooperation/participation level    SLP Home Exercise Plan  Word finding HEP    Consulted and Agree with Plan of Care  Patient       Patient will benefit from skilled therapeutic intervention in order to improve the following deficits and impairments:    1. Aphasia       Problem List There are no active problems to display for this patient.   [place], MA, CCC-SLP 05/20/2019, 12:30 PM  [place] MAIN [facility] SERVICES [address] [place], [place], [address] Phone: [phone]   Fax:  [phone]   Name: Adriana Hughes MRN: [id number] Date of Birth: [birth year]

## 2019-06-03 ENCOUNTER — Ambulatory Visit: Payer: Medicare Other | Admitting: Speech Pathology

## 2019-06-10 ENCOUNTER — Ambulatory Visit: Payer: Medicare Other | Admitting: Speech Pathology

## 2019-06-17 ENCOUNTER — Ambulatory Visit: Payer: Medicare Other | Admitting: Speech Pathology

## 2019-06-24 ENCOUNTER — Ambulatory Visit: Payer: Medicare Other | Admitting: Speech Pathology

## 2019-07-01 ENCOUNTER — Ambulatory Visit: Payer: Medicare Other | Admitting: Speech Pathology

## 2019-07-01 ENCOUNTER — Other Ambulatory Visit: Payer: Self-pay

## 2019-07-08 ENCOUNTER — Ambulatory Visit: Payer: Medicare Other | Admitting: Speech Pathology

## 2019-07-15 ENCOUNTER — Ambulatory Visit: Payer: Medicare Other | Admitting: Speech Pathology

## 2019-07-22 ENCOUNTER — Ambulatory Visit: Payer: Medicare Other | Admitting: Speech Pathology

## 2019-07-29 ENCOUNTER — Ambulatory Visit: Payer: Medicare Other | Admitting: Speech Pathology

## 2019-08-05 ENCOUNTER — Ambulatory Visit: Payer: Medicare Other | Admitting: Speech Pathology

## 2019-08-12 ENCOUNTER — Ambulatory Visit: Payer: Medicare Other | Admitting: Speech Pathology

## 2019-08-19 ENCOUNTER — Ambulatory Visit: Payer: Medicare Other | Admitting: Speech Pathology

## 2019-08-26 ENCOUNTER — Ambulatory Visit: Payer: Medicare Other | Admitting: Speech Pathology

## 2019-09-02 ENCOUNTER — Ambulatory Visit: Payer: Medicare Other | Admitting: Speech Pathology

## 2020-05-30 ENCOUNTER — Ambulatory Visit: Payer: Medicare Other | Attending: Family Medicine | Admitting: Physical Therapy

## 2020-05-30 ENCOUNTER — Encounter: Payer: Self-pay | Admitting: Physical Therapy

## 2020-05-30 DIAGNOSIS — R262 Difficulty in walking, not elsewhere classified: Secondary | ICD-10-CM

## 2020-05-30 DIAGNOSIS — R2681 Unsteadiness on feet: Secondary | ICD-10-CM | POA: Diagnosis present

## 2020-05-30 DIAGNOSIS — M6281 Muscle weakness (generalized): Secondary | ICD-10-CM | POA: Diagnosis present

## 2020-05-30 NOTE — Therapy (Signed)
[facility] [address] [place], [place], [address] Phone: [phone]   Fax:  [phone]  Physical Therapy Evaluation  Patient Details  Name: Adriana Hughes MRN: [id number] Date of Birth: 1941-01-25 Referring Provider (PT): Kandyce RudMarcus Babaoff MD   Encounter Date: 05/30/2020   PT End of Session - 05/30/20 1102    Visit Number 1    Number of Visits 9    Date for PT Re-Evaluation 07/25/20    Authorization Type FOTO- PT    PT Start Time 1002    PT Stop Time 1100    PT Time Calculation (min) 58 min    Equipment Utilized During Treatment Gait belt    Activity Tolerance Patient tolerated treatment well    Behavior [facility] Therapy [facility] for tasks assessed/performed           Past Medical History:  Diagnosis Date  . Arthritis   . Fibrocystic breast disease   . GERD (gastroesophageal reflux disease)   . Hyperlipidemia   . Hypertension     Past Surgical History:  Procedure Laterality Date  . APPENDECTOMY    . BREAST BIOPSY    . BREAST EXCISIONAL BIOPSY Bilateral [id number]   benign  . EYE SURGERY    . TEE WITHOUT CARDIOVERSION N/A 04/21/2018   Procedure: TRANSESOPHAGEAL ECHOCARDIOGRAM (TEE);  Surgeon: Lamar BlinksKowalski, Bruce J, MD;  Location: [facility] ORS;  Service: Cardiovascular;  Laterality: N/A;    There were no vitals filed for this visit.    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 Patient completed Physical therapy from Aug-Nov 2019; She finished therapy with good mobility, walking without assistive device. She reports over the last year she has been doing pretty well. Her physician recommended that she return to therapy for a reassesment to see if therapy could help with mobility. She is not currently using any assistive device. She denies any falls in last  6 months. She denies any numbness/tingling; She is currently independent in all self care ADLs. She does some cooking. She is back to driving;    Limitations Standing    How long can you sit comfortably? NA    How long can you stand comfortably? 10-15 min with increased fatigue    How long can you walk comfortably? Pt reports she is walking approximately 1/2 mile 3-4 times a day;    Diagnostic tests none recent;    Patient Stated Goals "Be able to stand longer, improve activity tolerance."    Currently in Pain? No/denies    Multiple Pain Sites No              [facility] PT Assessment - 05/30/20 0001      Assessment   Medical Diagnosis s/p CVA    Referring Provider (PT) Kandyce RudMarcus Babaoff MD    Onset Date/Surgical Date 03/17/18    Hand Dominance Right    Next MD Visit 6 months    Prior Therapy had outpatient PT following stroke in Aug-Nov 2019 with good results; denies any recent therapy      Precautions   Precautions None      Restrictions   Weight Bearing Restrictions No      Balance Screen   Has the patient fallen in the past 6 months No    Has the  patient had a decrease in activity level because of a fear of falling?  No    Is the patient reluctant to leave their home because of a fear of falling?  No      Home Environment   Living Environment Private residence    Living Arrangements Spouse/significant other    Type of Home House    Home Access Stairs to enter    Entrance Stairs-Number of Steps 2    Entrance Stairs-Rails Left    Home Layout One level    Home Equipment Hand held shower head      Prior Function   Level of Independence Independent with basic ADLs    Vocation Retired    Leisure watch grandchildren (7 and 14)      Observation/Other Assessments   Observations very nice woman;     Focus on Therapeutic Outcomes (FOTO)  60/100      Sensation   Light Touch Appears Intact    Proprioception Appears Intact      Coordination   Gross Motor Movements are Fluid  and Coordinated Yes    Fine Motor Movements are Fluid and Coordinated Yes    Finger Nose Finger Test accurate, slightly slow on right side    Heel Shin Test accurate bilaterally      Posture/Postural Control   Posture Comments WFL      ROM / Strength   AROM / PROM / Strength AROM;Strength      AROM   Overall AROM Comments BUE and BLE are [facility]      Strength   Strength Assessment Site Hip;Knee;Ankle    Right/Left Hip Right;Left    Right Hip Flexion 4-/5    Right Hip ABduction 4-/5    Right Hip ADduction 3/5    Left Hip Flexion 4+/5    Left Hip ABduction 4-/5    Left Hip ADduction 4-/5    Right/Left Knee Right;Left    Right Knee Flexion 4-/5    Right Knee Extension 4/5    Left Knee Flexion 4/5    Left Knee Extension 4+/5    Right/Left Ankle Right;Left    Right Ankle Dorsiflexion 4-/5    Left Ankle Dorsiflexion 4/5      6 Minute Walk- Baseline   6 Minute Walk- Baseline yes    BP (mmHg) 124/82    HR (bpm) 87    02 Sat (%RA) 100 %      6 Minute walk- Post Test   6 Minute Walk Post Test yes    BP (mmHg) 123/68    HR (bpm) 110    02 Sat (%RA) 100 %    Modified Borg Scale for Dyspnea 3- Moderate shortness of breath or breathing difficulty      6 minute walk test results    Aerobic Endurance Distance Walked 1240      Standardized Balance Assessment   Five times sit to stand comments  11.68 sec with arms across chest, low fall risk    10 Meter Walk 1.09 m/s without AD, community ambulator, low fall risk, slightly slower than 08/24/18 which was 1.16 m/s      Berg Balance Test   Sit to Stand Able to stand without using hands and stabilize independently    Standing Unsupported Able to stand safely 2 minutes    Sitting with Back Unsupported but Feet Supported on Floor or Stool Able to sit safely and securely 2 minutes    Stand to Sit Sits safely with minimal  use of hands    Transfers Able to transfer safely, minor use of hands    Standing Unsupported with Eyes Closed Able to  stand 10 seconds safely    Standing Unsupported with Feet Together Able to place feet together independently and stand 1 minute safely    From Standing, Reach Forward with Outstretched Arm Can reach confidently >25 cm (10")    From Standing Position, Pick up Object from Floor Able to pick up shoe safely and easily    From Standing Position, Turn to Look Behind Over each Shoulder Looks behind from both sides and weight shifts well    Turn 360 Degrees Able to turn 360 degrees safely but slowly    Standing Unsupported, Alternately Place Feet on Step/Stool Able to stand independently and safely and complete 8 steps in 20 seconds    Standing Unsupported, One Foot in Front Able to take small step independently and hold 30 seconds    Standing on One Leg Able to lift leg independently and hold equal to or more than 3 seconds    Total Score 50    Berg comment: slight risk for falls, more impaired than 08/24/18 which was 54/56                      Objective measurements completed on examination: See above findings.               PT Education - 05/30/20 1102    Education Details recommendation/POC    Person(s) Educated Patient    Methods Explanation    Comprehension Verbalized understanding            PT Short Term Goals - 05/30/20 1111      PT SHORT TERM GOAL #1   Title Patient will be adherent to HEP at least 3x a week to improve functional strength and balance for better safety at home.    Time 4    Period Weeks    Status New    Target Date 06/27/20             PT Long Term Goals - 05/30/20 1111      PT LONG TERM GOAL #1   Title Patient will improve FOTO score to >65/100 to indicate improved functional mobility;    Time 8    Period Weeks    Status New    Target Date 07/25/20      PT LONG TERM GOAL #2   Title Patient will ascend/descend 4 stairs without rail assist independently without loss of balance to improve ability to get in/out of home.      Time 8    Period Weeks    Status New    Target Date 07/25/20      PT LONG TERM GOAL #3   Title Patient will demonstrate an improved Berg Balance Score of > 53/56 as to demonstrate improved balance with ADLs such as sitting/standing and transfer balance and reduced fall risk    Time 8    Period Weeks    Status New    Target Date 07/25/20      PT LONG TERM GOAL #4   Title Patient will increase RLE gross strength to 4+/5 as to improve functional strength for independent gait, increased standing tolerance and increased ADL ability.    Time 8    Period Weeks    Status New    Target Date 07/25/20  Plan - 05/30/20 1103    Clinical Impression Statement 79 yo Female presents to therapy with continued right sided weakness after old CVA on 03/17/18. Patient did receive outpatient PT from Aug-Nov 2019 with good results. She currently does exhibit slight decline in mobility since discharge from PT in Nov 2019. Patient does exhibit mild weakness in RLE. She tested as a low fall risk although does have some balance deficits with mild veering side/side with dynamic walking. She would benefit from additional skilled PT Intervention to improve strength, balance and mobility;    Personal Factors and Comorbidities Age;Comorbidity 3+;Time since onset of injury/illness/exacerbation    Comorbidities HTN, HLD, past stroke, GERD    Examination-Activity Limitations Stairs;Stand;Locomotion Level    Examination-Participation Restrictions Cleaning;Community Activity;Shop;Volunteer;Yard Work    Stability/Clinical Decision Making Stable/Uncomplicated    Optometrist Low    Rehab Potential Good    PT Frequency 1x / week    PT Duration 8 weeks    PT Treatment/Interventions Patient/family education;Balance training;Functional mobility training;Therapeutic activities;Therapeutic exercise;Gait training;Cryotherapy;Moist Heat;Stair training;Neuromuscular re-education;Energy conservation     PT Next Visit Plan consider FGA/DGI; work on LE strengthening/balance    PT Home Exercise Plan initiate next session with LE strengthening- standing exercise    Consulted and Agree with Plan of Care Patient           Patient will benefit from skilled therapeutic intervention in order to improve the following deficits and impairments:  Abnormal gait, Decreased balance, Decreased endurance, Decreased mobility, Difficulty walking, Decreased activity tolerance, Decreased safety awareness, Decreased strength  Visit Diagnosis: Muscle weakness (generalized)  Difficulty in walking, not elsewhere classified  Unsteadiness on feet     Problem List There are no problems to display for this patient.   Amar Keenum PT, DPT 05/30/2020, 11:13 AM  [facility] MAIN [facility] SERVICES [address] [place], [place], [address] Phone: [phone]   Fax:  [phone]  Name: Adriana Hughes MRN: [id number] Date of Birth: Aug 12, 1941

## 2020-06-05 ENCOUNTER — Other Ambulatory Visit: Payer: Self-pay

## 2020-06-05 ENCOUNTER — Ambulatory Visit: Payer: Medicare Other | Attending: Family Medicine

## 2020-06-05 DIAGNOSIS — M6281 Muscle weakness (generalized): Secondary | ICD-10-CM | POA: Insufficient documentation

## 2020-06-05 DIAGNOSIS — R2681 Unsteadiness on feet: Secondary | ICD-10-CM | POA: Insufficient documentation

## 2020-06-05 DIAGNOSIS — R262 Difficulty in walking, not elsewhere classified: Secondary | ICD-10-CM | POA: Insufficient documentation

## 2020-06-05 NOTE — Therapy (Signed)
[place] [facility] MAIN [facility] SERVICES [address] [place], [place], [address] Phone: [phone]   Fax:  [phone]  Physical Therapy Treatment  Patient Details  Name: Adriana Hughes MRN: [id number] Date of Birth: [birth year] Referring Provider (PT): Kandyce Rud MD   Encounter Date: 06/05/2020   PT End of Session - 06/05/20 1433    Visit Number 2    Number of Visits 9    Date for PT Re-Evaluation 07/25/20    Authorization Type FOTO- PT    PT Start Time 1430    PT Stop Time 1514    PT Time Calculation (min) 44 min    Equipment Utilized During Treatment Gait belt    Activity Tolerance Patient tolerated treatment well    Behavior During Therapy [facility] for tasks assessed/performed           Past Medical History:  Diagnosis Date  . Arthritis   . Fibrocystic breast disease   . GERD (gastroesophageal reflux disease)   . Hyperlipidemia   . Hypertension     Past Surgical History:  Procedure Laterality Date  . APPENDECTOMY    . BREAST BIOPSY    . BREAST EXCISIONAL BIOPSY Bilateral [id number]   benign  . EYE SURGERY    . TEE WITHOUT CARDIOVERSION N/A 04/21/2018   Procedure: TRANSESOPHAGEAL ECHOCARDIOGRAM (TEE);  Surgeon: Lamar Blinks, MD;  Location: [facility] ORS;  Service: Cardiovascular;  Laterality: N/A;    There were no vitals filed for this visit.   Subjective Assessment - 06/05/20 1432    Subjective Patient reports having a good weekend, no falls or LOB. Reports no pain.    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 Patient completed Physical therapy from Aug-Nov 2019; She finished therapy with good mobility, walking without assistive device. She reports over the last year she has been doing pretty well. Her physician recommended that she return to therapy for a reassesment to see if therapy could help with mobility. She is not currently using any assistive device. She denies any falls in last 6 months.  She denies any numbness/tingling; She is currently independent in all self care ADLs. She does some cooking. She is back to driving;    Limitations Standing    How long can you sit comfortably? NA    How long can you stand comfortably? 10-15 min with increased fatigue    How long can you walk comfortably? Pt reports she is walking approximately 1/2 mile 3-4 times a day;    Diagnostic tests none recent;    Patient Stated Goals "Be able to stand longer, improve activity tolerance."    Currently in Pain? No/denies              [facility] PT Assessment - 06/05/20 0001      Standardized Balance Assessment   Standardized Balance Assessment Dynamic Gait Index      Dynamic Gait Index   Level Surface Mild Impairment    Change in Gait Speed Mild Impairment    Gait with Horizontal Head Turns Mild Impairment    Gait with Vertical Head Turns Moderate Impairment    Gait and Pivot Turn Mild Impairment    Step Over Obstacle Mild Impairment    Step Around Obstacles Mild Impairment    Steps Mild Impairment    Total Score 15           Nustep Lvl 3 RPM>60 for cardiovascular challenge x 3 minutes  DGI(dynamic gait index) test: 15/24  Standing with support surface:  airex pad: one foot on 6" step for modified tandem stance with horizontal head turns airex pad 6" step toe taps with cues for weight shift and stabilization   airex pad under feet: sit to stand no UE support, grab into ball and throw small object at target for pertubation and stabilization on unstable surface. X 15   6' step eccentric toe taps BUE support; heel tap with focus on foot placement and control for descending steps.   Resisted walking matrix machine #12. 5 weight, side stepping, 3x each direction, mod A for RLE due to scissoring last few reps   #3 ankle weight:  -seated LAQ 10x 3 second holds -standing hip extension with focus on upright posture and gluteal activation/squeeze 15x each LE -standing hip abduction 15x  each LE, focus on stabilization on single limb while laterally abducting opposite limb.   Initiation/termination of ambulation (red light green light) 2x 86 ft with one episode of near LOB, ; second set performed with dual task of naming grocery shopping list with patient demonstrating increased episodes of R foot drag.     Pt educated throughout session about proper posture and technique with exercises. Improved exercise technique, movement at target joints, use of target muscles after min to mod verbal, visual, tactile cues                    PT Education - 06/05/20 1432    Education Details exercise technique, body mechanics    Person(s) Educated Patient    Methods Explanation;Demonstration;Tactile cues;Verbal cues    Comprehension Verbalized understanding;Returned demonstration;Verbal cues required;Tactile cues required            PT Short Term Goals - 05/30/20 1111      PT SHORT TERM GOAL #1   Title Patient will be adherent to HEP at least 3x a week to improve functional strength and balance for better safety at home.    Time 4    Period Weeks    Status New    Target Date 06/27/20             PT Long Term Goals - 05/30/20 1111      PT LONG TERM GOAL #1   Title Patient will improve FOTO score to >65/100 to indicate improved functional mobility;    Time 8    Period Weeks    Status New    Target Date 07/25/20      PT LONG TERM GOAL #2   Title Patient will ascend/descend 4 stairs without rail assist independently without loss of balance to improve ability to get in/out of home.     Time 8    Period Weeks    Status New    Target Date 07/25/20      PT LONG TERM GOAL #3   Title Patient will demonstrate an improved Berg Balance Score of > 53/56 as to demonstrate improved balance with ADLs such as sitting/standing and transfer balance and reduced fall risk    Time 8    Period Weeks    Status New    Target Date 07/25/20      PT LONG TERM GOAL #4    Title Patient will increase RLE gross strength to 4+/5 as to improve functional strength for independent gait, increased standing tolerance and increased ADL ability.    Time 8    Period Weeks    Status New  Target Date 07/25/20                 Plan - 06/05/20 1517    Clinical Impression Statement Patient is very challenged with dual task interventions with regression to foot drag ambulatory mechanics of RLE. Patient is challenged with single limb stability on affected limb with buckling of limb and fatigue. Difficulty noted with controlling TKE and inversion/eversion of R ankle during standing activities. CGA and close supervision throughout to maintain safety. She would benefit from additional skilled PT Intervention to improve strength, balance and mobility;    Personal Factors and Comorbidities Age;Comorbidity 3+;Time since onset of injury/illness/exacerbation    Comorbidities HTN, HLD, past stroke, GERD    Examination-Activity Limitations Stairs;Stand;Locomotion Level    Examination-Participation Restrictions Cleaning;Community Activity;Shop;Volunteer;Yard Work    Stability/Clinical Decision Making Stable/Uncomplicated    Rehab Potential Good    PT Frequency 1x / week    PT Duration 8 weeks    PT Treatment/Interventions Patient/family education;Balance training;Functional mobility training;Therapeutic activities;Therapeutic exercise;Gait training;Cryotherapy;Moist Heat;Stair training;Neuromuscular re-education;Energy conservation    PT Next Visit Plan RLE strength, dual task foot clearance RLE    PT Home Exercise Plan initiate next session with LE strengthening- standing exercise    Consulted and Agree with Plan of Care Patient           Patient will benefit from skilled therapeutic intervention in order to improve the following deficits and impairments:  Abnormal gait, Decreased balance, Decreased endurance, Decreased mobility, Difficulty walking, Decreased activity  tolerance, Decreased safety awareness, Decreased strength  Visit Diagnosis: Muscle weakness (generalized)  Difficulty in walking, not elsewhere classified  Unsteadiness on feet     Problem List There are no problems to display for this patient.  Precious Bard, PT, DPT   06/05/2020, 3:19 PM  [place] [facility] MAIN [facility] SERVICES [address] [place], [place], [address] Phone: [phone]   Fax:  [phone]  Name: MARCEE JACOBS MRN: [id number] Date of Birth: May 24, 1941

## 2020-06-09 ENCOUNTER — Ambulatory Visit: Payer: Medicare Other

## 2020-06-12 ENCOUNTER — Ambulatory Visit: Payer: Medicare Other

## 2020-06-12 ENCOUNTER — Other Ambulatory Visit: Payer: Self-pay

## 2020-06-12 DIAGNOSIS — M6281 Muscle weakness (generalized): Secondary | ICD-10-CM

## 2020-06-12 DIAGNOSIS — R2681 Unsteadiness on feet: Secondary | ICD-10-CM

## 2020-06-12 DIAGNOSIS — R262 Difficulty in walking, not elsewhere classified: Secondary | ICD-10-CM

## 2020-06-12 NOTE — Therapy (Signed)
[facility] MAIN [facility] SERVICES [address] [place], [place], [address] Phone: [phone]   Fax:  [phone]  Physical Therapy Treatment  Patient Details  Name: Adriana Hughes MRN: [id number] Date of Birth: 1941-10-17 Referring Provider (PT): Kandyce Rud MD   Encounter Date: 06/12/2020   PT End of Session - 06/12/20 0936    Visit Number 3    Number of Visits 9    Date for PT Re-Evaluation 07/25/20    Authorization Type FOTO- PT    PT Start Time 0932    PT Stop Time 1014    PT Time Calculation (min) 42 min    Equipment Utilized During Treatment Gait belt    Activity Tolerance Patient tolerated treatment well    Behavior During Therapy [facility] for tasks assessed/performed           Past Medical History:  Diagnosis Date  . Arthritis   . Fibrocystic breast disease   . GERD (gastroesophageal reflux disease)   . Hyperlipidemia   . Hypertension     Past Surgical History:  Procedure Laterality Date  . APPENDECTOMY    . BREAST BIOPSY    . BREAST EXCISIONAL BIOPSY Bilateral [id number]   benign  . EYE SURGERY    . TEE WITHOUT CARDIOVERSION N/A 04/21/2018   Procedure: TRANSESOPHAGEAL ECHOCARDIOGRAM (TEE);  Surgeon: Lamar Blinks, MD;  Location: [facility] ORS;  Service: Cardiovascular;  Laterality: N/A;    There were no vitals filed for this visit.   Subjective Assessment - 06/12/20 0935    Subjective Patient reports having a good weekend, just cleaned her house. No falls or LOB since last session.    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 Patient completed Physical therapy from Aug-Nov 2019; She finished therapy with good mobility, walking without assistive device. She reports over the last year she has been doing pretty well. Her physician recommended that she return to therapy for a reassesment to see if therapy could help with mobility. She is not currently using any assistive device. She denies  any falls in last 6 months. She denies any numbness/tingling; She is currently independent in all self care ADLs. She does some cooking. She is back to driving;    Limitations Standing    How long can you sit comfortably? NA    How long can you stand comfortably? 10-15 min with increased fatigue    How long can you walk comfortably? Pt reports she is walking approximately 1/2 mile 3-4 times a day;    Diagnostic tests none recent;    Patient Stated Goals "Be able to stand longer, improve activity tolerance."    Currently in Pain? No/denies                 Treatment:  Nustep Lvl 3 RPM> 50 for cardiovascular support and musculoskeletal challenge  Ambulate with horizontal head turns in hall way, intermittent verbal cues to maintain feet in neutral position CGA/supervision, three near LOB requiring Min A to stabilize self 160 ft  Tandem walking on blue balance beam 3 rounds, intermittent use of UE support, decreased throughout reps with verbal cues Side stepping on blue balance beam 6x length of // bars no UE support   Airex pad: 6" step modified tandem stance 2x 60 seconds,  -weighted ball (2000 gr) modified tandem stance airex pad 6" step: chest press each foot placement (20x total), overhead press 10x each foot placement (20x total)  GTB lateral walks 4x length of // bars GTB monster/cowboy walks 4x length of // bars.   6 cones weaving; CGA, knocking cone over x 4 trials.  6 cones side stepping over.  Max cueing, unable to perform with RLE 4x .   RTB hip extension 10x each LE, 2 sets    ?Pt educated throughout session about proper posture and technique with exercises. Improved exercise technique, movement at target joints, use of target muscles after min to mod verbal, visual, tactile cues. 1-2 sitting rest breaks needed due to fatigue.                        PT Education - 06/12/20 0935    Education Details exercise technique, body mechanics    Person(s)  Educated Patient    Methods Explanation;Demonstration;Tactile cues;Verbal cues    Comprehension Verbalized understanding;Returned demonstration;Verbal cues required;Tactile cues required            PT Short Term Goals - 05/30/20 1111      PT SHORT TERM GOAL #1   Title Patient will be adherent to HEP at least 3x a week to improve functional strength and balance for better safety at home.    Time 4    Period Weeks    Status New    Target Date 06/27/20             PT Long Term Goals - 05/30/20 1111      PT LONG TERM GOAL #1   Title Patient will improve FOTO score to >65/100 to indicate improved functional mobility;    Time 8    Period Weeks    Status New    Target Date 07/25/20      PT LONG TERM GOAL #2   Title Patient will ascend/descend 4 stairs without rail assist independently without loss of balance to improve ability to get in/out of home.     Time 8    Period Weeks    Status New    Target Date 07/25/20      PT LONG TERM GOAL #3   Title Patient will demonstrate an improved Berg Balance Score of > 53/56 as to demonstrate improved balance with ADLs such as sitting/standing and transfer balance and reduced fall risk    Time 8    Period Weeks    Status New    Target Date 07/25/20      PT LONG TERM GOAL #4   Title Patient will increase RLE gross strength to 4+/5 as to improve functional strength for independent gait, increased standing tolerance and increased ADL ability.    Time 8    Period Weeks    Status New    Target Date 07/25/20                 Plan - 06/12/20 1243    Clinical Impression Statement Patient is challenged with foot clearance when fatigued with decreased single limb stability. Patient is challenged with dual task and head turns with active mobility. CGA and close supervision throughout to maintain safety.Most difficulty with weight side stepping eccentric control for RLE She would benefit from additional skilled PT Intervention to improve  strength, balance and mobility;    Personal Factors and Comorbidities Age;Comorbidity 3+;Time since onset of injury/illness/exacerbation    Comorbidities HTN, HLD, past stroke, GERD    Examination-Activity Limitations Stairs;Stand;Locomotion Level    Examination-Participation Restrictions Cleaning;Community Activity;Shop;Volunteer;Yard Work    Stability/Clinical Decision Making Stable/Uncomplicated  Rehab Potential Good    PT Frequency 1x / week    PT Duration 8 weeks    PT Treatment/Interventions Patient/family education;Balance training;Functional mobility training;Therapeutic activities;Therapeutic exercise;Gait training;Cryotherapy;Moist Heat;Stair training;Neuromuscular re-education;Energy conservation    PT Next Visit Plan RLE strength, dual task foot clearance RLE    PT Home Exercise Plan initiate next session with LE strengthening- standing exercise    Consulted and Agree with Plan of Care Patient           Patient will benefit from skilled therapeutic intervention in order to improve the following deficits and impairments:  Abnormal gait, Decreased balance, Decreased endurance, Decreased mobility, Difficulty walking, Decreased activity tolerance, Decreased safety awareness, Decreased strength  Visit Diagnosis: Muscle weakness (generalized)  Difficulty in walking, not elsewhere classified  Unsteadiness on feet     Problem List There are no problems to display for this patient.  Precious Bard, PT, DPT   06/12/2020, 12:44 PM  [facility] MAIN [facility] SERVICES [address] [place], [place], [address] Phone: [phone]   Fax:  [phone]  Name: Adriana Hughes MRN: [id number] Date of Birth: December 08, 1940

## 2020-06-14 ENCOUNTER — Ambulatory Visit: Payer: Medicare Other

## 2020-06-19 ENCOUNTER — Other Ambulatory Visit: Payer: Self-pay

## 2020-06-19 ENCOUNTER — Ambulatory Visit: Payer: Medicare Other

## 2020-06-19 DIAGNOSIS — R2681 Unsteadiness on feet: Secondary | ICD-10-CM

## 2020-06-19 DIAGNOSIS — M6281 Muscle weakness (generalized): Secondary | ICD-10-CM

## 2020-06-19 DIAGNOSIS — R262 Difficulty in walking, not elsewhere classified: Secondary | ICD-10-CM

## 2020-06-19 NOTE — Therapy (Signed)
[facility] MAIN [facility] SERVICES [address] [place], [place], [address] Phone: [phone]   Fax:  [phone]  Physical Therapy Treatment  Patient Details  Name: Adriana Hughes MRN: [id number] Date of Birth: 18-Nov-1940 Referring Provider (PT): Kandyce Rud MD   Encounter Date: 06/19/2020   PT End of Session - 06/19/20 1250    Visit Number 4    Number of Visits 9    Date for PT Re-Evaluation 07/25/20    Authorization Type FOTO- PT    PT Start Time 1102    PT Stop Time 1146    PT Time Calculation (min) 44 min    Equipment Utilized During Treatment Gait belt    Activity Tolerance Patient tolerated treatment well    Behavior During Therapy [facility] for tasks assessed/performed           Past Medical History:  Diagnosis Date  . Arthritis   . Fibrocystic breast disease   . GERD (gastroesophageal reflux disease)   . Hyperlipidemia   . Hypertension     Past Surgical History:  Procedure Laterality Date  . APPENDECTOMY    . BREAST BIOPSY    . BREAST EXCISIONAL BIOPSY Bilateral [id number]   benign  . EYE SURGERY    . TEE WITHOUT CARDIOVERSION N/A 04/21/2018   Procedure: TRANSESOPHAGEAL ECHOCARDIOGRAM (TEE);  Surgeon: Lamar Blinks, MD;  Location: [facility] ORS;  Service: Cardiovascular;  Laterality: N/A;    There were no vitals filed for this visit.   Subjective Assessment - 06/19/20 1105    Subjective Patient reports after her last session she went home and picked up 5-6 weeds out of the garden and then had severe back pain, L side.    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 Patient completed Physical therapy from Aug-Nov 2019; She finished therapy with good mobility, walking without assistive device. She reports over the last year she has been doing pretty well. Her physician recommended that she return to therapy for a reassesment to see if therapy could help with mobility. She is not currently  using any assistive device. She denies any falls in last 6 months. She denies any numbness/tingling; She is currently independent in all self care ADLs. She does some cooking. She is back to driving;    Limitations Standing    How long can you sit comfortably? NA    How long can you stand comfortably? 10-15 min with increased fatigue    How long can you walk comfortably? Pt reports she is walking approximately 1/2 mile 3-4 times a day;    Diagnostic tests none recent;    Patient Stated Goals "Be able to stand longer, improve activity tolerance."    Currently in Pain? Yes    Pain Score 7     Pain Location Back    Pain Orientation Left;Lower    Pain Descriptors / Indicators Aching    Pain Type Acute pain    Pain Onset More than a month ago    Pain Frequency Intermittent            in // bars: CGA with cues for safety awareness and sequencing.  3 cone tap with no UE support, challenging to RLE  Orange hurdle forward backwards stepping 10x each LE Orange hurdle lateral stepping 10x  Each LE challenging leaving enough room for each LE for spatial awareness  3lb ankle weight: -standing marches with cues to widen BOS by placing PT  foot between patients feet 15x  -standing abduction side step 15x each LE, sight pain increase in low back -hip extension, modified 10x each LE Seated with 3lb ankle weight -RLE  LAQ 10x 3 second holds -RLE march 10x; slight increase pain -RLE ER with green ball between knees. 10x  Half foam roller: df/pf 20x with UE support bosu ball modified lunges with BUE support 12x each LE  airex pad:  -NBOS 60 seconds -NBOS horizontal head turns 10x -NBOS vertical head turns 10x, very challenging  seated: RTB ankle 4 way: 10x each direction dynadisc RLE clockwise 10x, counterclockwise 10x  swiss ball forward trunk lean with chin tuck 10x    ?Pt educated throughout session about proper posture and technique with exercises. Improved exercise technique,  movement at target joints, use of target muscles after min to mod verbal, visual, tactile cues. 1-2 sitting rest breaks needed due to fatigue.                       PT Education - 06/19/20 1250    Education Details body mechanics, ankle stability    Person(s) Educated Patient    Methods Explanation;Demonstration;Tactile cues;Verbal cues    Comprehension Verbalized understanding;Returned demonstration;Verbal cues required;Tactile cues required            PT Short Term Goals - 05/30/20 1111      PT SHORT TERM GOAL #1   Title Patient will be adherent to HEP at least 3x a week to improve functional strength and balance for better safety at home.    Time 4    Period Weeks    Status New    Target Date 06/27/20             PT Long Term Goals - 05/30/20 1111      PT LONG TERM GOAL #1   Title Patient will improve FOTO score to >65/100 to indicate improved functional mobility;    Time 8    Period Weeks    Status New    Target Date 07/25/20      PT LONG TERM GOAL #2   Title Patient will ascend/descend 4 stairs without rail assist independently without loss of balance to improve ability to get in/out of home.     Time 8    Period Weeks    Status New    Target Date 07/25/20      PT LONG TERM GOAL #3   Title Patient will demonstrate an improved Berg Balance Score of > 53/56 as to demonstrate improved balance with ADLs such as sitting/standing and transfer balance and reduced fall risk    Time 8    Period Weeks    Status New    Target Date 07/25/20      PT LONG TERM GOAL #4   Title Patient will increase RLE gross strength to 4+/5 as to improve functional strength for independent gait, increased standing tolerance and increased ADL ability.    Time 8    Period Weeks    Status New    Target Date 07/25/20                 Plan - 06/19/20 1251    Clinical Impression Statement Patient presents with excellent motivation despite low back pain.  Stabilization interventions limited due to pain however focused foot clearance of affected RLE was tolerated. Ankle stabilizing interventions progressed with good tolerance however patient does fatigue quickly. She would benefit from additional skilled PT  Intervention to improve strength, balance and mobility;    Personal Factors and Comorbidities Age;Comorbidity 3+;Time since onset of injury/illness/exacerbation    Comorbidities HTN, HLD, past stroke, GERD    Examination-Activity Limitations Stairs;Stand;Locomotion Level    Examination-Participation Restrictions Cleaning;Community Activity;Shop;Volunteer;Yard Work    Stability/Clinical Decision Making Stable/Uncomplicated    Rehab Potential Good    PT Frequency 1x / week    PT Duration 8 weeks    PT Treatment/Interventions Patient/family education;Balance training;Functional mobility training;Therapeutic activities;Therapeutic exercise;Gait training;Cryotherapy;Moist Heat;Stair training;Neuromuscular re-education;Energy conservation    PT Next Visit Plan RLE strength, dual task foot clearance RLE    PT Home Exercise Plan initiate next session with LE strengthening- standing exercise    Consulted and Agree with Plan of Care Patient           Patient will benefit from skilled therapeutic intervention in order to improve the following deficits and impairments:  Abnormal gait, Decreased balance, Decreased endurance, Decreased mobility, Difficulty walking, Decreased activity tolerance, Decreased safety awareness, Decreased strength  Visit Diagnosis: Muscle weakness (generalized)  Difficulty in walking, not elsewhere classified  Unsteadiness on feet     Problem List There are no problems to display for this patient.  Precious Bard, PT, DPT   06/19/2020, 12:52 PM  [place] [facility] MAIN [facility] SERVICES [address] [place], [place], [address] Phone: [phone]   Fax:  [phone]  Name: SEBA MADOLE MRN: [id number] Date of Birth: 1941-06-17

## 2020-06-21 ENCOUNTER — Ambulatory Visit: Payer: Medicare Other

## 2020-06-27 ENCOUNTER — Other Ambulatory Visit: Payer: Self-pay

## 2020-06-27 ENCOUNTER — Ambulatory Visit: Payer: Medicare Other

## 2020-06-27 DIAGNOSIS — M6281 Muscle weakness (generalized): Secondary | ICD-10-CM | POA: Diagnosis not present

## 2020-06-27 DIAGNOSIS — R262 Difficulty in walking, not elsewhere classified: Secondary | ICD-10-CM

## 2020-06-27 DIAGNOSIS — R2681 Unsteadiness on feet: Secondary | ICD-10-CM

## 2020-06-27 NOTE — Therapy (Signed)
[facility] MAIN [facility] SERVICES [address] [place], [place], [address] Phone: [phone]   Fax:  [phone]  Physical Therapy Treatment  Patient Details  Name: Adriana Hughes MRN: [id number] Date of Birth: Sep 04, 1941 Referring Provider (PT): Kandyce Rud MD   Encounter Date: 06/27/2020   PT End of Session - 06/27/20 1350    Visit Number 5    Number of Visits 9    Date for PT Re-Evaluation 07/25/20    Authorization Type FOTO- PT    PT Start Time 1344    PT Stop Time 1429    PT Time Calculation (min) 45 min    Equipment Utilized During Treatment Gait belt    Activity Tolerance Patient tolerated treatment well    Behavior During Therapy [facility] for tasks assessed/performed           Past Medical History:  Diagnosis Date   Arthritis    Fibrocystic breast disease    GERD (gastroesophageal reflux disease)    Hyperlipidemia    Hypertension     Past Surgical History:  Procedure Laterality Date   APPENDECTOMY     BREAST BIOPSY     BREAST EXCISIONAL BIOPSY Bilateral [id number]   benign   EYE SURGERY     TEE WITHOUT CARDIOVERSION N/A 04/21/2018   Procedure: TRANSESOPHAGEAL ECHOCARDIOGRAM (TEE);  Surgeon: Lamar Blinks, MD;  Location: [facility] ORS;  Service: Cardiovascular;  Laterality: N/A;    There were no vitals filed for this visit.   Subjective Assessment - 06/27/20 1347    Subjective Patient reports compliance with HEP. No falls or LOB since last session. Back pain is better and no longer is bothering her.    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 Patient completed Physical therapy from Aug-Nov 2019; She finished therapy with good mobility, walking without assistive device. She reports over the last year she has been doing pretty well. Her physician recommended that she return to therapy for a reassesment to see if therapy could help with mobility. She is not currently using any  assistive device. She denies any falls in last 6 months. She denies any numbness/tingling; She is currently independent in all self care ADLs. She does some cooking. She is back to driving;    Limitations Standing    How long can you sit comfortably? NA    How long can you stand comfortably? 10-15 min with increased fatigue    How long can you walk comfortably? Pt reports she is walking approximately 1/2 mile 3-4 times a day;    Diagnostic tests none recent;    Patient Stated Goals "Be able to stand longer, improve activity tolerance."    Currently in Pain? No/denies                 Treatment:   Nustep Lvl 3 RPM> 50 for cardiovascular support/challenge  Fwd/ bwd walking fast and stopping, turning with fast walking  x5 laps each with mod VCS for sequencing.. Patient had difficulty with turning left quickly and  changing from forward to backward walking;    Forward walking level surface eyes closed x20 feet x3 laps   in // bars airex pad 6" step up/down no UE support 15x each LE   Side step up and over 4 inch step with airex pad,  X 10  25 % need of UE for support    Modified forward/backwards lunges x 2x length of // bars  Standing with # 4 ankle weight: CGA for stability  -Hip extension with bilateral upper extremity support, cueing for neutral hip alignment, upright posture for optimal muscle recruitment, and sequencing, 10x each LE,  -Hip abduction with bilateral upper extremity support, cueing for neutral foot alignment for correct muscle activation, 10x each LE -Hip flexion with bilateral upper extremity support, cueing for body mechanics, speed of muscle recruitment for optimal strengthening and stabilization 10x each LE -Hamstring curl with bilateral upper extremity support, cueing for knee alignment for recruitment of hamstring musculature, 10x each LE   Seated with # 4 ankle weights  -Seated marches with upright posture, back away from back of chair for  abdominal/trunk activation/stabilization, 10x each LE -Seated LAQ with 3 second holds, 10x each LE, cueing for muscle activation and sequencing for neutral alignment -Seated IR/ER with cueing for stabilizing knee placement with lateral foot movement for optimal muscle recruitment, 10x each LE  Modified squat/STS with UE support 10x, feet supported by 2x4  [place] in // bars 4x length of // bars. Finger tip support   Tilt board: anterior/posterior BUE support 10x each foot placement.   RLE ankle 4 way 10x each LE      CGA and Min  verbal cues used throughout with increased in postural sway and LOB most seen with narrow base of support and while on uneven surfaces. Continues to have balance deficits typical with diagnosis. Patient performs intermediate level exercises without pain behaviors and needs verbal cuing for postural alignment and head positioning                          PT Education - 06/27/20 1349    Education Details body mechanics, exercise technique,    Person(s) Educated Patient    Methods Explanation;Demonstration;Tactile cues;Verbal cues    Comprehension Verbalized understanding;Returned demonstration;Verbal cues required;Tactile cues required            PT Short Term Goals - 05/30/20 1111      PT SHORT TERM GOAL #1   Title Patient will be adherent to HEP at least 3x a week to improve functional strength and balance for better safety at home.    Time 4    Period Weeks    Status New    Target Date 06/27/20             PT Long Term Goals - 05/30/20 1111      PT LONG TERM GOAL #1   Title Patient will improve FOTO score to >65/100 to indicate improved functional mobility;    Time 8    Period Weeks    Status New    Target Date 07/25/20      PT LONG TERM GOAL #2   Title Patient will ascend/descend 4 stairs without rail assist independently without loss of balance to improve ability to get in/out of home.     Time 8    Period Weeks     Status New    Target Date 07/25/20      PT LONG TERM GOAL #3   Title Patient will demonstrate an improved Berg Balance Score of > 53/56 as to demonstrate improved balance with ADLs such as sitting/standing and transfer balance and reduced fall risk    Time 8    Period Weeks    Status New    Target Date 07/25/20      PT LONG TERM GOAL #4   Title Patient will increase RLE gross  strength to 4+/5 as to improve functional strength for independent gait, increased standing tolerance and increased ADL ability.    Time 8    Period Weeks    Status New    Target Date 07/25/20                 Plan - 06/28/20 0750    Clinical Impression Statement Patient continues to present physical therapy with excellent motivation. Increased stabilization and exercise technique tolerated well with increased resistance resulting in fatigue. Weight shift and foot clearance improving with verbal cues as visual assistance. She would benefit from additional skilled PT Intervention to improve strength, balance and mobility;    Personal Factors and Comorbidities Age;Comorbidity 3+;Time since onset of injury/illness/exacerbation    Comorbidities HTN, HLD, past stroke, GERD    Examination-Activity Limitations Stairs;Stand;Locomotion Level    Examination-Participation Restrictions Cleaning;Community Activity;Shop;Volunteer;Yard Work    Stability/Clinical Decision Making Stable/Uncomplicated    Rehab Potential Good    PT Frequency 1x / week    PT Duration 8 weeks    PT Treatment/Interventions Patient/family education;Balance training;Functional mobility training;Therapeutic activities;Therapeutic exercise;Gait training;Cryotherapy;Moist Heat;Stair training;Neuromuscular re-education;Energy conservation    PT Next Visit Plan RLE strength, dual task foot clearance RLE    PT Home Exercise Plan initiate next session with LE strengthening- standing exercise    Consulted and Agree with Plan of Care Patient            Patient will benefit from skilled therapeutic intervention in order to improve the following deficits and impairments:  Abnormal gait, Decreased balance, Decreased endurance, Decreased mobility, Difficulty walking, Decreased activity tolerance, Decreased safety awareness, Decreased strength  Visit Diagnosis: Muscle weakness (generalized)  Difficulty in walking, not elsewhere classified  Unsteadiness on feet     Problem List There are no problems to display for this patient.  Precious Bard, PT, DPT   06/28/2020, 7:52 AM  [place] [facility] MAIN [facility] SERVICES [address] [place], [place], [address] Phone: [phone]   Fax:  [phone]  Name: Adriana Hughes MRN: [id number] Date of Birth: 05/07/1941

## 2020-06-29 ENCOUNTER — Ambulatory Visit: Payer: Medicare Other

## 2020-07-04 ENCOUNTER — Other Ambulatory Visit: Payer: Self-pay

## 2020-07-04 ENCOUNTER — Ambulatory Visit: Payer: Medicare Other

## 2020-07-04 DIAGNOSIS — R262 Difficulty in walking, not elsewhere classified: Secondary | ICD-10-CM

## 2020-07-04 DIAGNOSIS — R2681 Unsteadiness on feet: Secondary | ICD-10-CM

## 2020-07-04 DIAGNOSIS — M6281 Muscle weakness (generalized): Secondary | ICD-10-CM

## 2020-07-04 NOTE — Therapy (Signed)
[facility] MAIN [facility] SERVICES [address] [place], [place], [address] Phone: [phone]   Fax:  [phone]  Physical Therapy Treatment  Patient Details  Name: Adriana Hughes MRN: [id number] Date of Birth: 1941-02-01 Referring Provider (PT): Kandyce Rud MD   Encounter Date: 07/04/2020   PT End of Session - 07/04/20 1405    Visit Number 6    Number of Visits 9    Date for PT Re-Evaluation 07/25/20    Authorization Type FOTO- PT    PT Start Time 1345    PT Stop Time 1429    PT Time Calculation (min) 44 min    Equipment Utilized During Treatment Gait belt    Activity Tolerance Patient tolerated treatment well    Behavior During Therapy [facility] for tasks assessed/performed           Past Medical History:  Diagnosis Date  . Arthritis   . Fibrocystic breast disease   . GERD (gastroesophageal reflux disease)   . Hyperlipidemia   . Hypertension     Past Surgical History:  Procedure Laterality Date  . APPENDECTOMY    . BREAST BIOPSY    . BREAST EXCISIONAL BIOPSY Bilateral [id number]   benign  . EYE SURGERY    . TEE WITHOUT CARDIOVERSION N/A 04/21/2018   Procedure: TRANSESOPHAGEAL ECHOCARDIOGRAM (TEE);  Surgeon: Lamar Blinks, MD;  Location: [facility] ORS;  Service: Cardiovascular;  Laterality: N/A;    There were no vitals filed for this visit.   Subjective Assessment - 07/04/20 1350    Subjective Patient reports she went to the mall and shopping with her daughter with little to no difficulties. Was tired after last session but walking better when she got home.    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 Patient completed Physical therapy from Aug-Nov 2019; She finished therapy with good mobility, walking without assistive device. She reports over the last year she has been doing pretty well. Her physician recommended that she return to therapy for a reassesment to see if therapy could help with  mobility. She is not currently using any assistive device. She denies any falls in last 6 months. She denies any numbness/tingling; She is currently independent in all self care ADLs. She does some cooking. She is back to driving;    Limitations Standing    How long can you sit comfortably? NA    How long can you stand comfortably? 10-15 min with increased fatigue    How long can you walk comfortably? Pt reports she is walking approximately 1/2 mile 3-4 times a day;    Diagnostic tests none recent;    Patient Stated Goals "Be able to stand longer, improve activity tolerance."    Currently in Pain? No/denies                   Treatment:   Nustep Lvl 3 RPM> 50 for cardiovascular support/challenge     in // bars  6" step crane step ups 10x each LE, finger tip support.     Standing with # 4 ankle weight: CGA for stability   -Hip extension with bilateral upper extremity support, cueing for neutral hip alignment, upright posture for optimal muscle recruitment, and sequencing, 10x each LE,  -Hip abduction with bilateral upper extremity support, cueing for neutral foot alignment for correct muscle activation, 10x each LE -Hip flexion with bilateral upper extremity support, cueing for body mechanics, speed of muscle recruitment  for optimal strengthening and stabilization 10x each LE -Hamstring curl with bilateral upper extremity support, cueing for knee alignment for recruitment of hamstring musculature, 10x each LE -forward/backwards walking with partial march 4x length of // bars.      Seated with # 4 ankle weights  -Seated marches with upright posture, back away from back of chair for abdominal/trunk activation/stabilization, 10x each LE -Seated LAQ with 3 second holds, 10x each LE, cueing for muscle activation and sequencing for neutral alignment -Seated IR/ER with cueing for stabilizing knee placement with lateral foot movement for optimal muscle recruitment, 10x each LE   Tilt  board: anterior/posterior BUE support 15x each foot placement.    Bosu ball modified forward lunge, finger tip support 15x each LE  RLE ankle 4 way 15x each LE    Weighted ball (2000 gr) sit to stand 10x    CGA and Min  verbal cues used throughout with increased in postural sway and LOB most seen with narrow base of support and while on uneven surfaces. Continues to have balance deficits typical with diagnosis. Patient performs intermediate level exercises without pain behaviors and needs verbal cuing for postural alignment and head positioning                    PT Education - 07/04/20 1351    Education Details exercise technique, body mechanics    Person(s) Educated Patient    Methods Explanation;Demonstration;Tactile cues;Verbal cues    Comprehension Verbalized understanding;Returned demonstration;Verbal cues required;Tactile cues required            PT Short Term Goals - 05/30/20 1111      PT SHORT TERM GOAL #1   Title Patient will be adherent to HEP at least 3x a week to improve functional strength and balance for better safety at home.    Time 4    Period Weeks    Status New    Target Date 06/27/20             PT Long Term Goals - 05/30/20 1111      PT LONG TERM GOAL #1   Title Patient will improve FOTO score to >65/100 to indicate improved functional mobility;    Time 8    Period Weeks    Status New    Target Date 07/25/20      PT LONG TERM GOAL #2   Title Patient will ascend/descend 4 stairs without rail assist independently without loss of balance to improve ability to get in/out of home.     Time 8    Period Weeks    Status New    Target Date 07/25/20      PT LONG TERM GOAL #3   Title Patient will demonstrate an improved Berg Balance Score of > 53/56 as to demonstrate improved balance with ADLs such as sitting/standing and transfer balance and reduced fall risk    Time 8    Period Weeks    Status New    Target Date 07/25/20      PT  LONG TERM GOAL #4   Title Patient will increase RLE gross strength to 4+/5 as to improve functional strength for independent gait, increased standing tolerance and increased ADL ability.    Time 8    Period Weeks    Status New    Target Date 07/25/20                 Plan - 07/04/20 1715    Clinical  Impression Statement Patient presents with excellent motivation to physical therapy session.  Weight shift onto right LE is improving as well as foot clearance and spatial awareness. RLE does continue to fatigue quicker than LLE at this time however is improving in duration prior to fatigue. She would benefit from additional skilled PT Intervention to improve strength, balance and mobility;    Personal Factors and Comorbidities Age;Comorbidity 3+;Time since onset of injury/illness/exacerbation    Comorbidities HTN, HLD, past stroke, GERD    Examination-Activity Limitations Stairs;Stand;Locomotion Level    Examination-Participation Restrictions Cleaning;Community Activity;Shop;Volunteer;Yard Work    Stability/Clinical Decision Making Stable/Uncomplicated    Rehab Potential Good    PT Frequency 1x / week    PT Duration 8 weeks    PT Treatment/Interventions Patient/family education;Balance training;Functional mobility training;Therapeutic activities;Therapeutic exercise;Gait training;Cryotherapy;Moist Heat;Stair training;Neuromuscular re-education;Energy conservation    PT Next Visit Plan RLE strength, dual task foot clearance RLE    PT Home Exercise Plan initiate next session with LE strengthening- standing exercise    Consulted and Agree with Plan of Care Patient           Patient will benefit from skilled therapeutic intervention in order to improve the following deficits and impairments:  Abnormal gait, Decreased balance, Decreased endurance, Decreased mobility, Difficulty walking, Decreased activity tolerance, Decreased safety awareness, Decreased strength  Visit Diagnosis: Muscle  weakness (generalized)  Difficulty in walking, not elsewhere classified  Unsteadiness on feet     Problem List There are no problems to display for this patient.  Precious Bard, PT, DPT   07/04/2020, 5:16 PM  [facility] MAIN [place] SERVICES [address] [place], [place], [address] Phone: [phone]   Fax:  [phone]  Name: CHRISETTE MAN MRN: [id number] Date of Birth: January 22, 1941

## 2020-07-06 ENCOUNTER — Ambulatory Visit: Payer: Medicare Other

## 2020-07-12 ENCOUNTER — Other Ambulatory Visit: Payer: Self-pay

## 2020-07-12 ENCOUNTER — Ambulatory Visit: Payer: Medicare Other | Attending: Family Medicine

## 2020-07-12 DIAGNOSIS — R2681 Unsteadiness on feet: Secondary | ICD-10-CM | POA: Insufficient documentation

## 2020-07-12 DIAGNOSIS — R262 Difficulty in walking, not elsewhere classified: Secondary | ICD-10-CM | POA: Diagnosis present

## 2020-07-12 DIAGNOSIS — M6281 Muscle weakness (generalized): Secondary | ICD-10-CM | POA: Diagnosis not present

## 2020-07-12 NOTE — Therapy (Signed)
[facility] MAIN [facility] SERVICES [address] [place], [place], [address] Phone: [phone]   Fax:  [phone]  Physical Therapy Treatment  Patient Details  Name: Adriana Hughes MRN: [id number] Date of Birth: 10-20-1941 Referring Provider (PT): Kandyce Rud MD   Encounter Date: 07/12/2020   PT End of Session - 07/12/20 1259    Visit Number 7    Number of Visits 9    Date for PT Re-Evaluation 07/25/20    Authorization Type FOTO- PT    PT Start Time 1100    PT Stop Time 1144    PT Time Calculation (min) 44 min    Equipment Utilized During Treatment Gait belt    Activity Tolerance Patient tolerated treatment well    Behavior During Therapy [facility] for tasks assessed/performed           Past Medical History:  Diagnosis Date  . Arthritis   . Fibrocystic breast disease   . GERD (gastroesophageal reflux disease)   . Hyperlipidemia   . Hypertension     Past Surgical History:  Procedure Laterality Date  . APPENDECTOMY    . BREAST BIOPSY    . BREAST EXCISIONAL BIOPSY Bilateral [id number]   benign  . EYE SURGERY    . TEE WITHOUT CARDIOVERSION N/A 04/21/2018   Procedure: TRANSESOPHAGEAL ECHOCARDIOGRAM (TEE);  Surgeon: Lamar Blinks, MD;  Location: [facility] ORS;  Service: Cardiovascular;  Laterality: N/A;    There were no vitals filed for this visit.   Subjective Assessment - 07/12/20 1116    Subjective Patient reports no falls or LOB over the weekend. Wants to continue therapy. Has been compliant with HEP.    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 Patient completed Physical therapy from Aug-Nov 2019; She finished therapy with good mobility, walking without assistive device. She reports over the last year she has been doing pretty well. Her physician recommended that she return to therapy for a reassesment to see if therapy could help with mobility. She is not currently using any assistive device. She  denies any falls in last 6 months. She denies any numbness/tingling; She is currently independent in all self care ADLs. She does some cooking. She is back to driving;    Limitations Standing    How long can you sit comfortably? NA    How long can you stand comfortably? 10-15 min with increased fatigue    How long can you walk comfortably? Pt reports she is walking approximately 1/2 mile 3-4 times a day;    Diagnostic tests none recent;    Patient Stated Goals "Be able to stand longer, improve activity tolerance."    Currently in Pain? No/denies                 Treatment:      Next to support surface airex pad 6" step toe taps forward 12x each LE, no UE support airex pad 6' step lateral toe taps 15x each LE, no UE support   6" step crane step ups 10x each LE, finger tip support.     Standing with # 4 ankle weight: CGA for stability   -Hip extension with bilateral upper extremity support, cueing for neutral hip alignment, upright posture for optimal muscle recruitment, and sequencing, 10x each LE,  -Hip abduction with bilateral upper extremity support, cueing for neutral foot alignment for correct muscle activation, 10x each LE -Hip flexion with bilateral upper extremity support, cueing for  body mechanics, speed of muscle recruitment for optimal strengthening and stabilization 15x each LE -Hamstring curl with bilateral upper extremity support, cueing for knee alignment for recruitment of hamstring musculature, 10x each LE -forward/backwards walking with partial march 4x length of // bars.      Seated with # 4 ankle weights  -Seated marches with upright posture, back away from back of chair for abdominal/trunk activation/stabilization, 10x each LE -Seated LAQ with 3 second holds, 10x each LE, cueing for muscle activation and sequencing for neutral alignment -Seated IR/ER with cueing for stabilizing knee placement with lateral foot movement for optimal muscle recruitment, 10x each  LE   Seated: STS 10x no UE support RTB 4 way ankle resisted, 10x each direction  In hallway: Horizontal head turns 86 ft with increased scissor steppage requiring CGA/Min A for upright posture Ambulate with use of walking poles for arm swing assistance 2x 86 ft, decreased episodes of scissoring improved step length, close CGA required   CGA and Min  verbal cues used throughout with increased in postural sway and LOB most seen with narrow base of support and while on uneven surfaces. Continues to have balance deficits typical with diagnosis. Patient performs intermediate level exercises without pain behaviors and needs verbal cuing for postural alignment and head positioning                         PT Education - 07/12/20 1259    Education Details exercise technique, body mechanics    Person(s) Educated Patient    Methods Explanation;Demonstration;Tactile cues;Verbal cues    Comprehension Verbalized understanding;Returned demonstration;Verbal cues required;Tactile cues required            PT Short Term Goals - 05/30/20 1111      PT SHORT TERM GOAL #1   Title Patient will be adherent to HEP at least 3x a week to improve functional strength and balance for better safety at home.    Time 4    Period Weeks    Status New    Target Date 06/27/20             PT Long Term Goals - 05/30/20 1111      PT LONG TERM GOAL #1   Title Patient will improve FOTO score to >65/100 to indicate improved functional mobility;    Time 8    Period Weeks    Status New    Target Date 07/25/20      PT LONG TERM GOAL #2   Title Patient will ascend/descend 4 stairs without rail assist independently without loss of balance to improve ability to get in/out of home.     Time 8    Period Weeks    Status New    Target Date 07/25/20      PT LONG TERM GOAL #3   Title Patient will demonstrate an improved Berg Balance Score of > 53/56 as to demonstrate improved balance with ADLs such  as sitting/standing and transfer balance and reduced fall risk    Time 8    Period Weeks    Status New    Target Date 07/25/20      PT LONG TERM GOAL #4   Title Patient will increase RLE gross strength to 4+/5 as to improve functional strength for independent gait, increased standing tolerance and increased ADL ability.    Time 8    Period Weeks    Status New    Target Date 07/25/20  Plan - 07/12/20 1301    Clinical Impression Statement Patient continues to progress with functional strength and stability. As patient's RLE fatigues increased foot drag of LLE presents due to limited ability to retain single limb stability. Dual task and arm swing interventions tolerated well. She would benefit from additional skilled PT Intervention to improve strength, balance and mobility;    Personal Factors and Comorbidities Age;Comorbidity 3+;Time since onset of injury/illness/exacerbation    Comorbidities HTN, HLD, past stroke, GERD    Examination-Activity Limitations Stairs;Stand;Locomotion Level    Examination-Participation Restrictions Cleaning;Community Activity;Shop;Volunteer;Yard Work    Stability/Clinical Decision Making Stable/Uncomplicated    Rehab Potential Good    PT Frequency 1x / week    PT Duration 8 weeks    PT Treatment/Interventions Patient/family education;Balance training;Functional mobility training;Therapeutic activities;Therapeutic exercise;Gait training;Cryotherapy;Moist Heat;Stair training;Neuromuscular re-education;Energy conservation    PT Next Visit Plan RLE strength, dual task foot clearance RLE    PT Home Exercise Plan initiate next session with LE strengthening- standing exercise    Consulted and Agree with Plan of Care Patient           Patient will benefit from skilled therapeutic intervention in order to improve the following deficits and impairments:  Abnormal gait, Decreased balance, Decreased endurance, Decreased mobility, Difficulty  walking, Decreased activity tolerance, Decreased safety awareness, Decreased strength  Visit Diagnosis: Muscle weakness (generalized)  Difficulty in walking, not elsewhere classified  Unsteadiness on feet     Problem List There are no problems to display for this patient.  Precious Bard, PT, DPT   07/12/2020, 1:02 PM  [facility] MAIN [facility] SERVICES [address] [place], [place], [address] Phone: [phone]   Fax:  [phone]  Name: Adriana Hughes MRN: [id number] Date of Birth: 06/07/1941

## 2020-07-20 ENCOUNTER — Other Ambulatory Visit: Payer: Self-pay

## 2020-07-20 ENCOUNTER — Ambulatory Visit: Payer: Medicare Other

## 2020-07-20 DIAGNOSIS — M6281 Muscle weakness (generalized): Secondary | ICD-10-CM | POA: Diagnosis not present

## 2020-07-20 DIAGNOSIS — R262 Difficulty in walking, not elsewhere classified: Secondary | ICD-10-CM

## 2020-07-20 DIAGNOSIS — R2681 Unsteadiness on feet: Secondary | ICD-10-CM

## 2020-07-20 NOTE — Therapy (Signed)
[place] [address] [facility] MAIN [facility] SERVICES [address] [place], [place], [address] Phone: [phone]   Fax:  [phone]  Physical Therapy Treatment  Patient Details  Name: Adriana Hughes MRN: [id number] Date of Birth: May 11, 1941 Referring Provider (PT): Adriana Rud MD   Encounter Date: 07/20/2020   PT End of Session - 07/20/20 1355    Visit Number 8    Number of Visits 9    Date for PT Re-Evaluation 07/25/20    Authorization Type FOTO- PT    PT Start Time 1350    PT Stop Time 1430    PT Time Calculation (min) 40 min    Equipment Utilized During Treatment Gait belt    Activity Tolerance Patient tolerated treatment well    Behavior During Therapy [facility] for tasks assessed/performed           Past Medical History:  Diagnosis Date  . Arthritis   . Fibrocystic breast disease   . GERD (gastroesophageal reflux disease)   . Hyperlipidemia   . Hypertension     Past Surgical History:  Procedure Laterality Date  . APPENDECTOMY    . BREAST BIOPSY    . BREAST EXCISIONAL BIOPSY Bilateral [id number]   benign  . EYE SURGERY    . TEE WITHOUT CARDIOVERSION N/A 04/21/2018   Procedure: TRANSESOPHAGEAL ECHOCARDIOGRAM (TEE);  Surgeon: Lamar Blinks, MD;  Location: [facility] ORS;  Service: Cardiovascular;  Laterality: N/A;    There were no vitals filed for this visit.   Subjective Assessment - 07/20/20 1353    Subjective Patient reports shopping at [employer] prior to PT appointment today. No falls or LOB since last session.    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 Patient completed Physical therapy from Aug-Nov 2019; She finished therapy with good mobility, walking without assistive device. She reports over the last year she has been doing pretty well. Her physician recommended that she return to therapy for a reassesment to see if therapy could help with mobility. She is not currently using any assistive device. She  denies any falls in last 6 months. She denies any numbness/tingling; She is currently independent in all self care ADLs. She does some cooking. She is back to driving;    Limitations Standing    How long can you sit comfortably? NA    How long can you stand comfortably? 10-15 min with increased fatigue    How long can you walk comfortably? Pt reports she is walking approximately 1/2 mile 3-4 times a day;    Diagnostic tests none recent;    Patient Stated Goals "Be able to stand longer, improve activity tolerance."    Currently in Pain? No/denies                Treatment:    Nustep Lvl 4 RPM> 40 4 minutes for cardiovascular challenge/musucloskeletal challenge   Next to support surface airex pad eyes closed 30 seconds; improved stability no LOB Single leg stance: forward backwards LE kick (pendulum) 30 seconds each LE     Standing with # 4 ankle weight: CGA for stability   -Hip extension with bilateral upper extremity support, cueing for neutral hip alignment, upright posture for optimal muscle recruitment, and sequencing, 10x each LE,  -Hip abduction with bilateral upper extremity support, cueing for neutral foot alignment for correct muscle activation, 10x each LE -Hip flexion with bilateral upper extremity support, cueing for body mechanics, speed of muscle recruitment for  optimal strengthening and stabilization 15x each LE -Hamstring curl with bilateral upper extremity support, cueing for knee alignment for recruitment of hamstring musculature, 10x each LE   Seated with # 4 ankle weights  -Seated marches with upright posture, back away from back of chair for abdominal/trunk activation/stabilization, 10x each LE -Seated LAQ with 3 second holds, 10x each LE, cueing for muscle activation and sequencing for neutral alignment -Seated IR/ER with cueing for stabilizing knee placement with lateral foot movement for optimal muscle recruitment, 10x each LE   Seated: RTB around toes  marching 12x  RTB 4 way ankle resisted, 10x each direction  seated df/pf rockers 15x  Patient requires breaks for HR reduction to therapeutic range   CGA and Min  verbal cues used throughout with increased in postural sway and LOB most seen with narrow base of support and while on uneven surfaces. Continues to have balance deficits typical with diagnosis. Patient performs intermediate level exercises without pain behaviors and needs verbal cuing for postural alignment and head positioning                         PT Education - 07/20/20 1354    Education Details exercise technique, body mechanics    Person(s) Educated Patient    Methods Explanation;Demonstration;Tactile cues;Verbal cues    Comprehension Verbalized understanding;Returned demonstration;Verbal cues required;Tactile cues required            PT Short Term Goals - 05/30/20 1111      PT SHORT TERM GOAL #1   Title Patient will be adherent to HEP at least 3x a week to improve functional strength and balance for better safety at home.    Time 4    Period Weeks    Status New    Target Date 06/27/20             PT Long Term Goals - 05/30/20 1111      PT LONG TERM GOAL #1   Title Patient will improve FOTO score to >65/100 to indicate improved functional mobility;    Time 8    Period Weeks    Status New    Target Date 07/25/20      PT LONG TERM GOAL #2   Title Patient will ascend/descend 4 stairs without rail assist independently without loss of balance to improve ability to get in/out of home.     Time 8    Period Weeks    Status New    Target Date 07/25/20      PT LONG TERM GOAL #3   Title Patient will demonstrate an improved Berg Balance Score of > 53/56 as to demonstrate improved balance with ADLs such as sitting/standing and transfer balance and reduced fall risk    Time 8    Period Weeks    Status New    Target Date 07/25/20      PT LONG TERM GOAL #4   Title Patient will increase RLE  gross strength to 4+/5 as to improve functional strength for independent gait, increased standing tolerance and increased ADL ability.    Time 8    Period Weeks    Status New    Target Date 07/25/20                 Plan - 07/20/20 1635    Clinical Impression Statement Patient continues to progress with functional strengthening in single limb stance.  Occasional rest breaks required due to HR elevation  with fatigue. Dorsiflexion is improving in active range as well as strength with improved foot clearance. She would benefit from additional skilled PT Intervention to improve strength, balance and mobility;    Personal Factors and Comorbidities Age;Comorbidity 3+;Time since onset of injury/illness/exacerbation    Comorbidities HTN, HLD, past stroke, GERD    Examination-Activity Limitations Stairs;Stand;Locomotion Level    Examination-Participation Restrictions Cleaning;Community Activity;Shop;Volunteer;Yard Work    Stability/Clinical Decision Making Stable/Uncomplicated    Rehab Potential Good    PT Frequency 1x / week    PT Duration 8 weeks    PT Treatment/Interventions Patient/family education;Balance training;Functional mobility training;Therapeutic activities;Therapeutic exercise;Gait training;Cryotherapy;Moist Heat;Stair training;Neuromuscular re-education;Energy conservation    PT Next Visit Plan RLE strength, dual task foot clearance RLE    PT Home Exercise Plan initiate next session with LE strengthening- standing exercise    Consulted and Agree with Plan of Care Patient           Patient will benefit from skilled therapeutic intervention in order to improve the following deficits and impairments:  Abnormal gait, Decreased balance, Decreased endurance, Decreased mobility, Difficulty walking, Decreased activity tolerance, Decreased safety awareness, Decreased strength  Visit Diagnosis: Muscle weakness (generalized)  Difficulty in walking, not elsewhere  classified  Unsteadiness on feet     Problem List There are no problems to display for this patient.  Precious Bard, PT, DPT   07/20/2020, 4:36 PM  [place] [facility] MAIN [facility] SERVICES [address] [place], [place], [address] Phone: [phone]   Fax:  [phone]  Name: Adriana Hughes MRN: [id number] Date of Birth: [birth year]

## 2020-07-26 ENCOUNTER — Ambulatory Visit: Payer: Medicare Other

## 2020-07-26 ENCOUNTER — Other Ambulatory Visit: Payer: Self-pay

## 2020-07-26 DIAGNOSIS — R262 Difficulty in walking, not elsewhere classified: Secondary | ICD-10-CM

## 2020-07-26 DIAGNOSIS — R2681 Unsteadiness on feet: Secondary | ICD-10-CM

## 2020-07-26 DIAGNOSIS — M6281 Muscle weakness (generalized): Secondary | ICD-10-CM | POA: Diagnosis not present

## 2020-07-26 NOTE — Therapy (Signed)
[place] MAIN [facility] SERVICES [address] [place], [place], [address] Phone: [phone]   Fax:  [phone]  Physical Therapy Treatment/RECERT  Patient Details  Name: Adriana Hughes MRN: [id number] Date of Birth: June 23, 1941 Referring Provider (PT): Derinda Late MD   Encounter Date: 07/26/2020   PT End of Session - 07/26/20 1415    Visit Number 9    Number of Visits 17    Date for PT Re-Evaluation 09/20/20    Authorization Type FOTO- PT    PT Start Time 1300    PT Stop Time 1346    PT Time Calculation (min) 46 min    Equipment Utilized During Treatment Gait belt    Activity Tolerance Patient tolerated treatment well    Behavior During Therapy [facility] for tasks assessed/performed           Past Medical History:  Diagnosis Date  . Arthritis   . Fibrocystic breast disease   . GERD (gastroesophageal reflux disease)   . Hyperlipidemia   . Hypertension     Past Surgical History:  Procedure Laterality Date  . APPENDECTOMY    . BREAST BIOPSY    . BREAST EXCISIONAL BIOPSY Bilateral [id number]   benign  . EYE SURGERY    . TEE WITHOUT CARDIOVERSION N/A 04/21/2018   Procedure: TRANSESOPHAGEAL ECHOCARDIOGRAM (TEE);  Surgeon: Corey Skains, MD;  Location: [facility] ORS;  Service: Cardiovascular;  Laterality: N/A;    There were no vitals filed for this visit.   Subjective Assessment - 07/26/20 1304    Subjective Patient reports she is ~75% to her goal. Is improving with her balance and feels much more steady. Went to [employer] over the weekend.    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 Patient completed Physical therapy from Aug-Nov 2019; She finished therapy with good mobility, walking without assistive device. She reports over the last year she has been doing pretty well. Her physician recommended that she return to therapy for a reassesment to see if therapy could help with mobility. She is not  currently using any assistive device. She denies any falls in last 6 months. She denies any numbness/tingling; She is currently independent in all self care ADLs. She does some cooking. She is back to driving;    Limitations Standing    How long can you sit comfortably? NA    How long can you stand comfortably? 10-15 min with increased fatigue    How long can you walk comfortably? Pt reports she is walking approximately 1/2 mile 3-4 times a day;    Diagnostic tests none recent;    Patient Stated Goals "Be able to stand longer, improve activity tolerance."    Currently in Pain? No/denies              [facility] PT Assessment - 07/26/20 0001      Standardized Balance Assessment   Standardized Balance Assessment Dynamic Gait Index;Berg Balance Test      Berg Balance Test   Sit to Stand Able to stand without using hands and stabilize independently    Standing Unsupported Able to stand safely 2 minutes    Sitting with Back Unsupported but Feet Supported on Floor or Stool Able to sit safely and securely 2 minutes    Stand to Sit Sits safely with minimal use of hands    Transfers Able to transfer safely, minor use of hands    Standing Unsupported with Eyes Closed  Able to stand 10 seconds safely    Standing Unsupported with Feet Together Able to place feet together independently and stand 1 minute safely    From Standing, Reach Forward with Outstretched Arm Can reach confidently >25 cm (10")    From Standing Position, Pick up Object from Floor Able to pick up shoe safely and easily    From Standing Position, Turn to Look Behind Over each Shoulder Looks behind from both sides and weight shifts well    Turn 360 Degrees Able to turn 360 degrees safely one side only in 4 seconds or less    Standing Unsupported, Alternately Place Feet on Step/Stool Able to stand independently and safely and complete 8 steps in 20 seconds    Standing Unsupported, One Foot in Front Able to plae foot ahead of the other  independently and hold 30 seconds    Standing on One Leg Able to lift leg independently and hold 5-10 seconds    Total Score 53      Dynamic Gait Index   Level Surface Normal    Change in Gait Speed Normal    Gait with Horizontal Head Turns Moderate Impairment    Gait with Vertical Head Turns Mild Impairment    Gait and Pivot Turn Mild Impairment    Step Over Obstacle Mild Impairment    Step Around Obstacles Mild Impairment    Steps Mild Impairment    Total Score 17             Goals:  FOTO: 80.7 %  BERG: 53/ 56 (improved from 50/56 on eval )  RLE strength   Right Left  Hip flexion 4-/5 4+/5  Hip Abduction 4/5 4+/5  Hip Adduction 4-/5 4+/5  Knee Extension  4/5 4+/5  Knee Flexion 4-/5 4+/5  DF 3+/5 4+/5  PF 4/5 4+/5      stairs: ascend without rail, descend with rail reciprocating pattern.   Patient reports she is about 75% to where she would like to be.    Wants to pick up feet better when she walks  New goal : DGI: 17/24   Treatment: Nustep Lvl 3 RPM> 60 cardiovascular challenge and musculoskeletal strengthening x 4 minutes    standing with Divine support: -heel toe rocker for preswing 12x each LE placement -sumo squat modified 10x  Standing with #4ankle weight: CGA for stability  -Hip flexion withbilateralupper extremity support, cueing for body mechanics, speed of muscle recruitment for optimal strengthening and stabilization 15x each LE -heel raise (plantarflexion ) 15-20x -toe raise (dorsiflexion)15-20x  Seated with #4ankle weights  -Seated LAQ with 3 second holds, 10x each LE, cueing for muscle activation and sequencing for neutral alignment -Seated IR/ER with cueing for stabilizing knee placement with lateral foot movement for optimal muscle recruitment, 10x each LE   Seated: RTB around toes marching 12x  RTB 4 way ankle resisted, 10x each direction seated df/pf rockers 15x    Patient has met FOTO and BERG goal allowing for progression  and addition of DGI goal. RLE strength continues to progress however is an area for continued improvement. Weight shift onto right LE is improving as well as foot clearance and spatial awareness. RLE does continue to fatigue quicker than LLE at this time however is improving in duration prior to fatigue. Patient would benefit from one more recert for 1x/week to progress functional stabilization during ambulation.  She would benefit from additional skilled PT Intervention to improve strength, balance and decrease fall risk with dynamic  mobility.          PT Education - 07/26/20 1415    Education Details goals, body mechanics    Person(s) Educated Patient    Methods Explanation;Demonstration;Tactile cues;Verbal cues    Comprehension Verbalized understanding;Returned demonstration;Verbal cues required;Tactile cues required            PT Short Term Goals - 07/26/20 1411      PT SHORT TERM GOAL #1   Title Patient will be adherent to HEP at least 3x a week to improve functional strength and balance for better safety at home.    Baseline HEP compliant    Time 4    Period Weeks    Status Partially Met    Target Date 08/23/20             PT Long Term Goals - 07/26/20 1316      PT LONG TERM GOAL #1   Title Patient will improve FOTO score to >65/100 to indicate improved functional mobility;    Baseline 9/22: 80.7%    Time 8    Period Weeks    Status Achieved      PT LONG TERM GOAL #2   Title Patient will ascend/descend 4 stairs without rail assist independently without loss of balance to improve ability to get in/out of home.     Baseline 9/22: ascend without rail, descend with rail reciprocating pattern    Time 8    Period Weeks    Status Partially Met    Target Date 09/20/20      PT LONG TERM GOAL #3   Title Patient will demonstrate an improved Berg Balance Score of > 53/56 as to demonstrate improved balance with ADLs such as sitting/standing and transfer balance and reduced  fall risk    Baseline 9/22: 53/56    Time 8    Period Weeks    Status Achieved      PT LONG TERM GOAL #4   Title Patient will increase RLE gross strength to 4+/5 as to improve functional strength for independent gait, increased standing tolerance and increased ADL ability.    Baseline see note    Time 8    Period Weeks    Status Partially Met    Target Date 09/20/20      PT LONG TERM GOAL #5   Title Patient will perform the DGI and increase score to >19/24 for reduced fall risk and to demonstrate improved gait mechanics and mobility.    Baseline 9/22: 17/24    Time 8    Period Weeks    Status New    Target Date 09/20/20                 Plan - 07/26/20 1411    Clinical Impression Statement Patient has met FOTO and BERG goal allowing for progression and addition of DGI goal. RLE strength continues to progress however is an area for continued improvement. Weight shift onto right LE is improving as well as foot clearance and spatial awareness. RLE does continue to fatigue quicker than LLE at this time however is improving in duration prior to fatigue. Patient would benefit from one more recert for 1x/week to progress functional stabilization during ambulation.  She would benefit from additional skilled PT Intervention to improve strength, balance and decrease fall risk with dynamic mobility.    Personal Factors and Comorbidities Age;Comorbidity 3+;Time since onset of injury/illness/exacerbation    Comorbidities HTN, HLD, past stroke, GERD    Examination-Activity  Limitations Stairs;Stand;Locomotion Level    Examination-Participation Restrictions Cleaning;Community Activity;Shop;Volunteer;Yard Work    Stability/Clinical Decision Making Stable/Uncomplicated    Rehab Potential Good    PT Frequency 1x / week    PT Duration 8 weeks    PT Treatment/Interventions Patient/family education;Balance training;Functional mobility training;Therapeutic activities;Therapeutic exercise;Gait  training;Cryotherapy;Moist Heat;Stair training;Neuromuscular re-education;Energy conservation    PT Next Visit Plan RLE strength, dual task foot clearance RLE    PT Home Exercise Plan initiate next session with LE strengthening- standing exercise    Consulted and Agree with Plan of Care Patient           Patient will benefit from skilled therapeutic intervention in order to improve the following deficits and impairments:  Abnormal gait, Decreased balance, Decreased endurance, Decreased mobility, Difficulty walking, Decreased activity tolerance, Decreased safety awareness, Decreased strength  Visit Diagnosis: Muscle weakness (generalized) - Plan: PT plan of care cert/re-cert  Difficulty in walking, not elsewhere classified - Plan: PT plan of care cert/re-cert  Unsteadiness on feet - Plan: PT plan of care cert/re-cert     Problem List There are no problems to display for this patient.  Janna Arch, PT, DPT   07/26/2020, 2:16 PM  [place] MAIN [facility] SERVICES [address] [place], [place], [address] Phone: [phone]   Fax:  [phone]  Name: KRISTIANNA SAPERSTEIN MRN: [id number] Date of Birth: 1941/10/08

## 2020-08-02 ENCOUNTER — Ambulatory Visit: Payer: Medicare Other

## 2020-08-02 ENCOUNTER — Other Ambulatory Visit: Payer: Self-pay

## 2020-08-02 DIAGNOSIS — R262 Difficulty in walking, not elsewhere classified: Secondary | ICD-10-CM

## 2020-08-02 DIAGNOSIS — M6281 Muscle weakness (generalized): Secondary | ICD-10-CM | POA: Diagnosis not present

## 2020-08-02 DIAGNOSIS — R2681 Unsteadiness on feet: Secondary | ICD-10-CM

## 2020-08-02 NOTE — Therapy (Signed)
[place] MAIN [facility] SERVICES [address] [place], [place], [address] Phone: [phone]   Fax:  [phone]  Physical Therapy Treatment Physical Therapy Progress Note   Dates of reporting period  05/30/20  to   08/02/20  Patient Details  Name: Adriana Hughes MRN: [id number] Date of Birth: 1941-04-28 Referring Provider (PT): Derinda Late MD   Encounter Date: 08/02/2020   PT End of Session - 08/02/20 1304    Visit Number 10    Number of Visits 17    Date for PT Re-Evaluation 09/20/20    Authorization Type FOTO- PT; next session 1/10 PN 9/29    PT Start Time 1259    PT Stop Time 1343    PT Time Calculation (min) 44 min    Equipment Utilized During Treatment Gait belt    Activity Tolerance Patient tolerated treatment well    Behavior During Therapy WFL for tasks assessed/performed           Past Medical History:  Diagnosis Date  . Arthritis   . Fibrocystic breast disease   . GERD (gastroesophageal reflux disease)   . Hyperlipidemia   . Hypertension     Past Surgical History:  Procedure Laterality Date  . APPENDECTOMY    . BREAST BIOPSY    . BREAST EXCISIONAL BIOPSY Bilateral [id number]   benign  . EYE SURGERY    . TEE WITHOUT CARDIOVERSION N/A 04/21/2018   Procedure: TRANSESOPHAGEAL ECHOCARDIOGRAM (TEE);  Surgeon: Corey Skains, MD;  Location: [facility] ORS;  Service: Cardiovascular;  Laterality: N/A;    There were no vitals filed for this visit.   Subjective Assessment - 08/02/20 1303    Subjective Patient reports compliance with HEP, was cleaning this past week. No falls or LOB since last session.    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 Patient completed Physical therapy from Aug-Nov 2019; She finished therapy with good mobility, walking without assistive device. She reports over the last year she has been doing pretty well. Her physician recommended that she return to therapy for a  reassesment to see if therapy could help with mobility. She is not currently using any assistive device. She denies any falls in last 6 months. She denies any numbness/tingling; She is currently independent in all self care ADLs. She does some cooking. She is back to driving;    Limitations Standing    How long can you sit comfortably? NA    How long can you stand comfortably? 10-15 min with increased fatigue    How long can you walk comfortably? Pt reports she is walking approximately 1/2 mile 3-4 times a day;    Diagnostic tests none recent;    Patient Stated Goals "Be able to stand longer, improve activity tolerance."    Currently in Pain? No/denies                Goals performed last session on 07/26/20 Treatment:    Nustep Lvl 4 RPM> 60 4 minutes for cardiovascular challenge/musucloskeletal challenge   In // bars: airex balance beam: lateral stepping 4x length no LOB, no UE support  3 cone taps: no UE support 3x each LE, one near LOB.  airex pad eyes closed 30 seconds; improved stability no LOB  Single leg stance: forward backwards LE kick (pendulum) 10x each LE     grapevine with finger tip support 4x length of // bars.    Standing with # 4 ankle  weight: CGA for stability  -hip flexion marches 4x length of // bars, BUE support, cues for widened BOS for neutral hip alignment   -Hip extension with bilateral upper extremity support, cueing for neutral hip alignment, upright posture for optimal muscle recruitment, and sequencing, 10x each LE,  -Hip abduction/side stepping with bilateral upper extremity support, cueing for neutral foot alignment for correct muscle activation, 4x length of // bars  -Hamstring curl with bilateral upper extremity support, cueing for knee alignment for recruitment of hamstring musculature, 10x each LE   Seated with # 4 ankle weights  -Seated marches with upright posture, back away from back of chair for abdominal/trunk  activation/stabilization, 10x each LE -Seated LAQ with 3 second holds, 10x each LE, cueing for muscle activation and sequencing for neutral alignment -Seated IR/ER with cueing for stabilizing knee placement with lateral foot movement for optimal muscle recruitment, 10x each LE   Seated: RTB around toes marching 12x  RTB 4 way ankle resisted, 10x each direction  seated df/pf rockers on bilateral pro stretch 20x    Patient requires breaks for HR reduction to therapeutic range    CGA and Min  verbal cues used throughout with increased in postural sway and LOB most seen with narrow base of support and while on uneven surfaces. Continues to have balance deficits typical with diagnosis. Patient performs intermediate level exercises without pain behaviors and needs verbal cuing for postural alignment and head positioning       Patient's condition has the potential to improve in response to therapy. Maximum improvement is yet to be obtained. The anticipated improvement is attainable and reasonable in a generally predictable time.  Patient reports increased strength and balance, not quite there yet but at 75% of where she would like to be.                        PT Education - 08/02/20 1303    Education Details exercise technique, body mechanics    Person(s) Educated Patient    Methods Explanation;Demonstration;Tactile cues;Verbal cues    Comprehension Verbalized understanding;Returned demonstration;Verbal cues required            PT Short Term Goals - 07/26/20 1411      PT SHORT TERM GOAL #1   Title Patient will be adherent to HEP at least 3x a week to improve functional strength and balance for better safety at home.    Baseline HEP compliant    Time 4    Period Weeks    Status Partially Met    Target Date 08/23/20             PT Long Term Goals - 07/26/20 1316      PT LONG TERM GOAL #1   Title Patient will improve FOTO score to >65/100 to indicate improved  functional mobility;    Baseline 9/22: 80.7%    Time 8    Period Weeks    Status Achieved      PT LONG TERM GOAL #2   Title Patient will ascend/descend 4 stairs without rail assist independently without loss of balance to improve ability to get in/out of home.     Baseline 9/22: ascend without rail, descend with rail reciprocating pattern    Time 8    Period Weeks    Status Partially Met    Target Date 09/20/20      PT LONG TERM GOAL #3   Title Patient will demonstrate an improved  Berg Balance Score of > 53/56 as to demonstrate improved balance with ADLs such as sitting/standing and transfer balance and reduced fall risk    Baseline 9/22: 53/56    Time 8    Period Weeks    Status Achieved      PT LONG TERM GOAL #4   Title Patient will increase RLE gross strength to 4+/5 as to improve functional strength for independent gait, increased standing tolerance and increased ADL ability.    Baseline see note    Time 8    Period Weeks    Status Partially Met    Target Date 09/20/20      PT LONG TERM GOAL #5   Title Patient will perform the DGI and increase score to >19/24 for reduced fall risk and to demonstrate improved gait mechanics and mobility.    Baseline 9/22: 17/24    Time 8    Period Weeks    Status New    Target Date 09/20/20                 Plan - 08/02/20 1347    Clinical Impression Statement Patient's goals assessed session prior on 07/26/20, please refer to this note for further details on progression. Progression of strengthening interventions tolerated as well as progression of capacity of interventions.  Patient's condition has the potential to improve in response to therapy. Maximum improvement is yet to be obtained. The anticipated improvement is attainable and reasonable in a generally predictable time. She would benefit from additional skilled PT Intervention to improve strength, balance and decrease fall risk with dynamic mobility.    Personal Factors and  Comorbidities Age;Comorbidity 3+;Time since onset of injury/illness/exacerbation    Comorbidities HTN, HLD, past stroke, GERD    Examination-Activity Limitations Stairs;Stand;Locomotion Level    Examination-Participation Restrictions Cleaning;Community Activity;Shop;Volunteer;Yard Work    Stability/Clinical Decision Making Stable/Uncomplicated    Rehab Potential Good    PT Frequency 1x / week    PT Duration 8 weeks    PT Treatment/Interventions Patient/family education;Balance training;Functional mobility training;Therapeutic activities;Therapeutic exercise;Gait training;Cryotherapy;Moist Heat;Stair training;Neuromuscular re-education;Energy conservation    PT Next Visit Plan RLE strength, dual task foot clearance RLE    PT Home Exercise Plan initiate next session with LE strengthening- standing exercise    Consulted and Agree with Plan of Care Patient           Patient will benefit from skilled therapeutic intervention in order to improve the following deficits and impairments:  Abnormal gait, Decreased balance, Decreased endurance, Decreased mobility, Difficulty walking, Decreased activity tolerance, Decreased safety awareness, Decreased strength  Visit Diagnosis: Muscle weakness (generalized)  Difficulty in walking, not elsewhere classified  Unsteadiness on feet     Problem List There are no problems to display for this patient.  Janna Arch, PT, DPT   08/02/2020, 1:55 PM  [place] MAIN [facility] SERVICES [address] [place], [place], [address] Phone: [phone]   Fax:  [phone]  Name: Adriana Hughes MRN: [id number] Date of Birth: 1941-02-04

## 2020-08-09 ENCOUNTER — Ambulatory Visit: Payer: Medicare Other | Attending: Family Medicine

## 2020-08-09 ENCOUNTER — Other Ambulatory Visit: Payer: Self-pay

## 2020-08-09 DIAGNOSIS — M6281 Muscle weakness (generalized): Secondary | ICD-10-CM | POA: Insufficient documentation

## 2020-08-09 DIAGNOSIS — R2681 Unsteadiness on feet: Secondary | ICD-10-CM | POA: Insufficient documentation

## 2020-08-09 DIAGNOSIS — R262 Difficulty in walking, not elsewhere classified: Secondary | ICD-10-CM | POA: Insufficient documentation

## 2020-08-09 NOTE — Therapy (Signed)
[place] MAIN [facility] SERVICES [address] [place], [place], [address] Phone: [phone]   Fax:  [phone]  Physical Therapy Treatment  Patient Details  Name: Adriana Hughes MRN: [id number] Date of Birth: [birth year] Referring Provider (PT): Derinda Late MD   Encounter Date: 08/09/2020   PT End of Session - 08/09/20 1526    Visit Number 11    Number of Visits 17    Date for PT Re-Evaluation 09/20/20    Authorization Type 1/10 PN 9/29    PT Start Time 1430    PT Stop Time 1511    PT Time Calculation (min) 41 min    Equipment Utilized During Treatment Gait belt    Activity Tolerance Patient tolerated treatment well    Behavior During Therapy [facility] for tasks assessed/performed           Past Medical History:  Diagnosis Date  . Arthritis   . Fibrocystic breast disease   . GERD (gastroesophageal reflux disease)   . Hyperlipidemia   . Hypertension     Past Surgical History:  Procedure Laterality Date  . APPENDECTOMY    . BREAST BIOPSY    . BREAST EXCISIONAL BIOPSY Bilateral [id number]   benign  . EYE SURGERY    . TEE WITHOUT CARDIOVERSION N/A 04/21/2018   Procedure: TRANSESOPHAGEAL ECHOCARDIOGRAM (TEE);  Surgeon: Corey Skains, MD;  Location: [facility] ORS;  Service: Cardiovascular;  Laterality: N/A;    There were no vitals filed for this visit.   Subjective Assessment - 08/09/20 1523    Subjective Patient reports compliance with HEP, no falls or LOB. 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 Patient completed Physical therapy from Aug-Nov 2019; She finished therapy with good mobility, walking without assistive device. She reports over the last year she has been doing pretty well. Her physician recommended that she return to therapy for a reassesment to see if therapy could help with mobility. She is not currently using any assistive device. She  denies any falls in last 6 months. She denies any numbness/tingling; She is currently independent in all self care ADLs. She does some cooking. She is back to driving;    Limitations Standing    How long can you sit comfortably? NA    How long can you stand comfortably? 10-15 min with increased fatigue    How long can you walk comfortably? Pt reports she is walking approximately 1/2 mile 3-4 times a day;    Diagnostic tests none recent;    Patient Stated Goals "Be able to stand longer, improve activity tolerance."    Currently in Pain? No/denies                   neuro re-ed; close CGA and in // bars with cues for safety and sequencing airex pad 6" step modified tandem stance throw ball at hoop for pertubation 15x each foot placement Step over 4 consecutive hurdles with varying heights for foot clearance x 8 lengths of // of bars. Decreasing UE support from BUE support to Misbah to no UE support Tilt board anterior/posterior 15x with BUE support each LE,  3 cone taps with decreasing UE support from bilateral to Malijah 8x each LE   Therex:  CGA for stability with cues for body mechanics and sequencing for optimal muscle recruitment 6" step up toss ball to SPT with PT guarding 12x each  LE,   4lb ankle weight: High knee marching BUE support 4x length of // bars cues for high knee movement.  Lateral stepping 4x length of // bars cues for upright posture.  Backward stepping 4x length of // bars with BUE support   Seated 4lb ankle weight -march 10x each LE cues for high amplitude of movement -LAQ 12x each LE, hold 2-3 seconds.   RTB around ankle, 10x each direction (4way) each LE       Pt educated throughout session about proper posture and technique with exercises. Improved exercise technique, movement at target joints, use of target muscles after min to mod verbal, visual, tactile cues           PT Education - 08/09/20 1526    Education Details exercise technique,  stabilization, dual task    Person(s) Educated Patient    Methods Explanation;Demonstration;Tactile cues;Verbal cues    Comprehension Verbalized understanding;Returned demonstration;Verbal cues required;Tactile cues required            PT Short Term Goals - 07/26/20 1411      PT SHORT TERM GOAL #1   Title Patient will be adherent to HEP at least 3x a week to improve functional strength and balance for better safety at home.    Baseline HEP compliant    Time 4    Period Weeks    Status Partially Met    Target Date 08/23/20             PT Long Term Goals - 07/26/20 1316      PT LONG TERM GOAL #1   Title Patient will improve FOTO score to >65/100 to indicate improved functional mobility;    Baseline 9/22: 80.7%    Time 8    Period Weeks    Status Achieved      PT LONG TERM GOAL #2   Title Patient will ascend/descend 4 stairs without rail assist independently without loss of balance to improve ability to get in/out of home.     Baseline 9/22: ascend without rail, descend with rail reciprocating pattern    Time 8    Period Weeks    Status Partially Met    Target Date 09/20/20      PT LONG TERM GOAL #3   Title Patient will demonstrate an improved Berg Balance Score of > 53/56 as to demonstrate improved balance with ADLs such as sitting/standing and transfer balance and reduced fall risk    Baseline 9/22: 53/56    Time 8    Period Weeks    Status Achieved      PT LONG TERM GOAL #4   Title Patient will increase RLE gross strength to 4+/5 as to improve functional strength for independent gait, increased standing tolerance and increased ADL ability.    Baseline see note    Time 8    Period Weeks    Status Partially Met    Target Date 09/20/20      PT LONG TERM GOAL #5   Title Patient will perform the DGI and increase score to >19/24 for reduced fall risk and to demonstrate improved gait mechanics and mobility.    Baseline 9/22: 17/24    Time 8    Period Weeks     Status New    Target Date 09/20/20                 Plan - 08/09/20 1528    Clinical Impression Statement Patient is progressing with functional  stabilization and mobilization with decreased need for UE support with dynamic and static interventions. Stepping up/down and modified foot positioning performed with decreased episodes of loss of balance. Weight shift is additionally improving with improved foot clearance of obstacles. She would benefit from additional skilled PT Intervention to improve strength, balance and decrease fall risk with dynamic mobility.    Personal Factors and Comorbidities Age;Comorbidity 3+;Time since onset of injury/illness/exacerbation    Comorbidities HTN, HLD, past stroke, GERD    Examination-Activity Limitations Stairs;Stand;Locomotion Level    Examination-Participation Restrictions Cleaning;Community Activity;Shop;Volunteer;Yard Work    Stability/Clinical Decision Making Stable/Uncomplicated    Rehab Potential Good    PT Frequency 1x / week    PT Duration 8 weeks    PT Treatment/Interventions Patient/family education;Balance training;Functional mobility training;Therapeutic activities;Therapeutic exercise;Gait training;Cryotherapy;Moist Heat;Stair training;Neuromuscular re-education;Energy conservation    PT Next Visit Plan RLE strength, dual task foot clearance RLE    PT Home Exercise Plan initiate next session with LE strengthening- standing exercise    Consulted and Agree with Plan of Care Patient           Patient will benefit from skilled therapeutic intervention in order to improve the following deficits and impairments:  Abnormal gait, Decreased balance, Decreased endurance, Decreased mobility, Difficulty walking, Decreased activity tolerance, Decreased safety awareness, Decreased strength  Visit Diagnosis: Muscle weakness (generalized)  Difficulty in walking, not elsewhere classified  Unsteadiness on feet     Problem List There are no  problems to display for this patient.  Janna Arch, PT, DPT   08/09/2020, 3:30 PM  [place] MAIN [facility] SERVICES [address] [place], [place], [address] Phone: [phone]   Fax:  [phone]  Name: Adriana Hughes MRN: [id number] Date of Birth: Nov 30, 1940

## 2020-08-15 ENCOUNTER — Ambulatory Visit: Payer: Medicare Other

## 2020-08-15 ENCOUNTER — Other Ambulatory Visit: Payer: Self-pay

## 2020-08-15 DIAGNOSIS — R262 Difficulty in walking, not elsewhere classified: Secondary | ICD-10-CM

## 2020-08-15 DIAGNOSIS — M6281 Muscle weakness (generalized): Secondary | ICD-10-CM | POA: Diagnosis not present

## 2020-08-15 NOTE — Therapy (Signed)
[place] MAIN [facility] SERVICES [address] [place], [place], [address] Phone: [phone]   Fax:  [phone]  Physical Therapy Treatment  Patient Details  Name: Adriana Hughes MRN: [id number] Date of Birth: 1941-07-11 Referring Provider (PT): Derinda Late MD   Encounter Date: 08/15/2020   PT End of Session - 08/15/20 1335    Visit Number 12    Number of Visits 17    Date for PT Re-Evaluation 09/20/20    Authorization Type 1/10 PN 9/29    PT Start Time 1332    PT Stop Time 1415    PT Time Calculation (min) 43 min    Equipment Utilized During Treatment Gait belt    Activity Tolerance Patient tolerated treatment well    Behavior During Therapy [facility] for tasks assessed/performed           Past Medical History:  Diagnosis Date  . Arthritis   . Fibrocystic breast disease   . GERD (gastroesophageal reflux disease)   . Hyperlipidemia   . Hypertension     Past Surgical History:  Procedure Laterality Date  . APPENDECTOMY    . BREAST BIOPSY    . BREAST EXCISIONAL BIOPSY Bilateral [id number]   benign  . EYE SURGERY    . TEE WITHOUT CARDIOVERSION N/A 04/21/2018   Procedure: TRANSESOPHAGEAL ECHOCARDIOGRAM (TEE);  Surgeon: Corey Skains, MD;  Location: [facility] ORS;  Service: Cardiovascular;  Laterality: N/A;    There were no vitals filed for this visit.   Subjective Assessment - 08/15/20 1334    Subjective Patient reports compliance with HEP, no falls or LOB. No pain reported upon arrival. No changes in health/medication since last therapy session. No specific questions/concerns upon arrival.    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 Patient completed Physical therapy from Aug-Nov 2019; She finished therapy with good mobility, walking without assistive device. She reports over the last year she has been doing pretty well. Her physician recommended that she return to therapy for a reassesment to see  if therapy could help with mobility. She is not currently using any assistive device. She denies any falls in last 6 months. She denies any numbness/tingling; She is currently independent in all self care ADLs. She does some cooking. She is back to driving;    Limitations Standing    How long can you sit comfortably? NA    How long can you stand comfortably? 10-15 min with increased fatigue    How long can you walk comfortably? Pt reports she is walking approximately 1/2 mile 3-4 times a day;    Diagnostic tests none recent;    Patient Stated Goals "Be able to stand longer, improve activity tolerance."    Currently in Pain? No/denies            TREATMENT   Neuromuscular Re-education  Close CGA and in // bars with cues for safety and sequencing, no UE support utilized unless otherwise noted; Alternating 6" step taps x 10 BLE; Alternating 8" step taps x 10 BLE; Airex alternating 6" step taps x 10 BLE; 6" orange hurdle forward step-over/back x 10 leading with each leg; 6" orange hurdle lateral step-over x 10 each direction;   Ther-ex  CGA for stability with cues for body mechanics and sequencing for optimal muscle recruitment NuStep L2 x 5 minutes for warm-up during history (3 minutes unbilled);  Standing marches with 4# ankle weights (AW) with BUE support on bars x  10 BLE; Standing HS curls with 4# ankle weights (AW) with BUE support on bars x 10 BLE; Standing hip abduction with 4# ankle weights (AW) with BUE support on bars x 10 BLE; Standing hip extension with 4# ankle weights (AW) with BUE support on bars x 10 BLE;  Seated LAQ with 4# AW x 10 BLE; Lateral stepping 4x length of // bars with 4# AW cues for upright posture; Backward stepping 4x length of // bars with 4# AW with BUE support  6" step up with 4# AW alternating LE x 10 with each LE;    Pt educated throughout session about proper posture and technique with exercises. Improved exercise technique, movement at target  joints, use of target muscles after min to mod verbal, visual, tactile cues   Patient is progressing with functional stabilization and mobilization with decreased need for UE support with dynamic and static interventions. Continued with similar strengthening exercises to improve hip strength and strategies for balance. Also worked on Dietitian working on stepping to [employer] single leg balance. Pt encouraged to continue HEP and follow-up as scheduled. She would benefit from additional skilled PT Intervention to improve strength, balance and decrease fall risk with dynamic mobility.                         PT Short Term Goals - 07/26/20 1411      PT SHORT TERM GOAL #1   Title Patient will be adherent to HEP at least 3x a week to improve functional strength and balance for better safety at home.    Baseline HEP compliant    Time 4    Period Weeks    Status Partially Met    Target Date 08/23/20             PT Long Term Goals - 07/26/20 1316      PT LONG TERM GOAL #1   Title Patient will improve FOTO score to >65/100 to indicate improved functional mobility;    Baseline 9/22: 80.7%    Time 8    Period Weeks    Status Achieved      PT LONG TERM GOAL #2   Title Patient will ascend/descend 4 stairs without rail assist independently without loss of balance to improve ability to get in/out of home.     Baseline 9/22: ascend without rail, descend with rail reciprocating pattern    Time 8    Period Weeks    Status Partially Met    Target Date 09/20/20      PT LONG TERM GOAL #3   Title Patient will demonstrate an improved Berg Balance Score of > 53/56 as to demonstrate improved balance with ADLs such as sitting/standing and transfer balance and reduced fall risk    Baseline 9/22: 53/56    Time 8    Period Weeks    Status Achieved      PT LONG TERM GOAL #4   Title Patient will increase RLE gross strength to 4+/5 as to improve functional strength for  independent gait, increased standing tolerance and increased ADL ability.    Baseline see note    Time 8    Period Weeks    Status Partially Met    Target Date 09/20/20      PT LONG TERM GOAL #5   Title Patient will perform the DGI and increase score to >19/24 for reduced fall risk and to demonstrate improved gait mechanics and mobility.  Baseline 9/22: 17/24    Time 8    Period Weeks    Status New    Target Date 09/20/20                 Plan - 08/15/20 1335    Clinical Impression Statement Patient is progressing with functional stabilization and mobilization with decreased need for UE support with dynamic and static interventions. Continued with similar strengthening exercises to improve hip strength and strategies for balance. Also worked on Dietitian working on stepping to [employer] single leg balance. Pt encouraged to continue HEP and follow-up as scheduled. She would benefit from additional skilled PT Intervention to improve strength, balance and decrease fall risk with dynamic mobility.    Personal Factors and Comorbidities Age;Comorbidity 3+;Time since onset of injury/illness/exacerbation    Comorbidities HTN, HLD, past stroke, GERD    Examination-Activity Limitations Stairs;Stand;Locomotion Level    Examination-Participation Restrictions Cleaning;Community Activity;Shop;Volunteer;Yard Work    Stability/Clinical Decision Making Stable/Uncomplicated    Rehab Potential Good    PT Frequency 1x / week    PT Duration 8 weeks    PT Treatment/Interventions Patient/family education;Balance training;Functional mobility training;Therapeutic activities;Therapeutic exercise;Gait training;Cryotherapy;Moist Heat;Stair training;Neuromuscular re-education;Energy conservation    PT Next Visit Plan RLE strength, dual task foot clearance RLE    PT Home Exercise Plan initiate next session with LE strengthening- standing exercise    Consulted and Agree with Plan of Care Patient            Patient will benefit from skilled therapeutic intervention in order to improve the following deficits and impairments:  Abnormal gait, Decreased balance, Decreased endurance, Decreased mobility, Difficulty walking, Decreased activity tolerance, Decreased safety awareness, Decreased strength  Visit Diagnosis: Muscle weakness (generalized)  Difficulty in walking, not elsewhere classified     Problem List There are no problems to display for this patient.  Phillips Grout PT, DPT, GCS  Adriana Hughes 08/15/2020, 3:03 PM  [place] MAIN [facility] SERVICES [address] [place], [place], [address] Phone: [phone]   Fax:  [phone]  Name: Adriana Hughes MRN: [id number] Date of Birth: 1941-10-23

## 2020-08-22 ENCOUNTER — Ambulatory Visit: Payer: Medicare Other

## 2020-08-22 ENCOUNTER — Other Ambulatory Visit: Payer: Self-pay

## 2020-08-22 DIAGNOSIS — R262 Difficulty in walking, not elsewhere classified: Secondary | ICD-10-CM

## 2020-08-22 DIAGNOSIS — M6281 Muscle weakness (generalized): Secondary | ICD-10-CM

## 2020-08-22 DIAGNOSIS — R2681 Unsteadiness on feet: Secondary | ICD-10-CM

## 2020-08-22 NOTE — Therapy (Signed)
[place] MAIN [facility] SERVICES [address] [place], [place], [address] Phone: [phone]   Fax:  [phone]  Physical Therapy Treatment  Patient Details  Name: Adriana Hughes MRN: [id number] Date of Birth: 24-Aug-1941 Referring Provider (PT): Derinda Late MD   Encounter Date: 08/22/2020   PT End of Session - 08/22/20 1520    Visit Number 13    Number of Visits 17    Date for PT Re-Evaluation 09/20/20    Authorization Type 3/10 PN 9/29    PT Start Time 1516    PT Stop Time 1600    PT Time Calculation (min) 44 min    Equipment Utilized During Treatment Gait belt    Activity Tolerance Patient tolerated treatment well    Behavior During Therapy [facility] for tasks assessed/performed           Past Medical History:  Diagnosis Date   Arthritis    Fibrocystic breast disease    GERD (gastroesophageal reflux disease)    Hyperlipidemia    Hypertension     Past Surgical History:  Procedure Laterality Date   APPENDECTOMY     BREAST BIOPSY     BREAST EXCISIONAL BIOPSY Bilateral [id number]   benign   EYE SURGERY     TEE WITHOUT CARDIOVERSION N/A 04/21/2018   Procedure: TRANSESOPHAGEAL ECHOCARDIOGRAM (TEE);  Surgeon: Corey Skains, MD;  Location: [facility] ORS;  Service: Cardiovascular;  Laterality: N/A;    There were no vitals filed for this visit.   Subjective Assessment - 08/22/20 1518    Subjective Patient denies falls or LOB since last session. States HEP compliance. She notes that balance has improved since beginning PT, but still has instances of instability when fatigued.    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 Patient completed Physical therapy from Aug-Nov 2019; She finished therapy with good mobility, walking without assistive device. She reports over the last year she has been doing pretty well. Her physician recommended that she return to therapy for a reassesment to see if  therapy could help with mobility. She is not currently using any assistive device. She denies any falls in last 6 months. She denies any numbness/tingling; She is currently independent in all self care ADLs. She does some cooking. She is back to driving;    Limitations Standing    How long can you sit comfortably? NA    How long can you stand comfortably? 10-15 min with increased fatigue    How long can you walk comfortably? Pt reports she is walking approximately 1/2 mile 3-4 times a day;    Diagnostic tests none recent;    Patient Stated Goals "Be able to stand longer, improve activity tolerance."    Currently in Pain? No/denies           Treatment: NuStep level 3, 4 minutes for BLE and BUE coordination. RPM >50 for cardiovascular challenge  STS with yellow med ball overhead press. 10x  Side stepping over hurdles and cones with 4# ankle weights. Performed 6x length of // bars. Near collision x 1 d/t close proximity to hurdle  Side stepping on foam balance beam 6x length of // bars  Tandem walking on foam balance beam without UE support. Patient able to self-stabilize using hip strategies. Performed 6x length of bars  Bosu lunges forward 10x each leg. No UE support to challenge balance.  Bosu balance (flat side up) 30 sec. PT cues for L weight  shift, as pt would lean to R throughout task.  Mini squats at bar 10x   On Airex Pad: step taps onto 6" step with 4# ankle weights. No UE support to further challenge balance. CGA for safety. Patient has near collision with step x 3 on RLE due to dec foot clearance. 10x each leg (20x total)    Pt educated throughout session about proper posture and technique with exercises. Improved exercise technique, movement at target joints, use of target muscles after min to mod verbal, visual, tactile cues                      PT Education - 08/22/20 1519    Education Details exercise technique, body mechanics    Person(s) Educated  Patient    Methods Explanation;Demonstration;Tactile cues;Verbal cues    Comprehension Verbalized understanding;Returned demonstration;Verbal cues required;Tactile cues required            PT Short Term Goals - 07/26/20 1411      PT SHORT TERM GOAL #1   Title Patient will be adherent to HEP at least 3x a week to improve functional strength and balance for better safety at home.    Baseline HEP compliant    Time 4    Period Weeks    Status Partially Met    Target Date 08/23/20             PT Long Term Goals - 07/26/20 1316      PT LONG TERM GOAL #1   Title Patient will improve FOTO score to >65/100 to indicate improved functional mobility;    Baseline 9/22: 80.7%    Time 8    Period Weeks    Status Achieved      PT LONG TERM GOAL #2   Title Patient will ascend/descend 4 stairs without rail assist independently without loss of balance to improve ability to get in/out of home.     Baseline 9/22: ascend without rail, descend with rail reciprocating pattern    Time 8    Period Weeks    Status Partially Met    Target Date 09/20/20      PT LONG TERM GOAL #3   Title Patient will demonstrate an improved Berg Balance Score of > 53/56 as to demonstrate improved balance with ADLs such as sitting/standing and transfer balance and reduced fall risk    Baseline 9/22: 53/56    Time 8    Period Weeks    Status Achieved      PT LONG TERM GOAL #4   Title Patient will increase RLE gross strength to 4+/5 as to improve functional strength for independent gait, increased standing tolerance and increased ADL ability.    Baseline see note    Time 8    Period Weeks    Status Partially Met    Target Date 09/20/20      PT LONG TERM GOAL #5   Title Patient will perform the DGI and increase score to >19/24 for reduced fall risk and to demonstrate improved gait mechanics and mobility.    Baseline 9/22: 17/24    Time 8    Period Weeks    Status New    Target Date 09/20/20                  Plan - 08/22/20 1612    Clinical Impression Statement Patient demonstrates continued progress in balance this session, indicated by ability to perform higher level balance tasks  with no overt LOB noted. Patient displays dec RLE strength, evidenced by dec foot clearance onto step with 4# ankle weights donned. Discussed potential for d/c next week. Patient notes marked improvement in balance since initiating skilled PT and is open to determining d/c date. She would benefit from additional skilled PT Intervention to improve strength, balance and decrease fall risk with dynamic mobility.    Personal Factors and Comorbidities Age;Comorbidity 3+;Time since onset of injury/illness/exacerbation    Comorbidities HTN, HLD, past stroke, GERD    Examination-Activity Limitations Stairs;Stand;Locomotion Level    Examination-Participation Restrictions Cleaning;Community Activity;Shop;Volunteer;Yard Work    Stability/Clinical Decision Making Stable/Uncomplicated    Rehab Potential Good    PT Frequency 1x / week    PT Duration 8 weeks    PT Treatment/Interventions Patient/family education;Balance training;Functional mobility training;Therapeutic activities;Therapeutic exercise;Gait training;Cryotherapy;Moist Heat;Stair training;Neuromuscular re-education;Energy conservation    PT Next Visit Plan RLE strength, dual task foot clearance RLE    PT Home Exercise Plan initiate next session with LE strengthening- standing exercise    Consulted and Agree with Plan of Care Patient           Patient will benefit from skilled therapeutic intervention in order to improve the following deficits and impairments:  Abnormal gait, Decreased balance, Decreased endurance, Decreased mobility, Difficulty walking, Decreased activity tolerance, Decreased safety awareness, Decreased strength  Visit Diagnosis: Muscle weakness (generalized)  Difficulty in walking, not elsewhere classified  Unsteadiness on  feet     Problem List There are no problems to display for this patient.  Tonny Bollman, SPT  This entire session was performed under direct supervision and direction of a licensed therapist/therapist assistant . I have personally read, edited and approve of the note as written.  Janna Arch, PT, DPT  08/22/2020, 4:13 PM  [place] MAIN [facility] SERVICES [address] [place], [place], [address] Phone: [phone]   Fax:  [phone]  Name: JOYE WESENBERG MRN: [id number] Date of Birth: [birth year]

## 2020-08-29 ENCOUNTER — Other Ambulatory Visit: Payer: Self-pay

## 2020-08-29 ENCOUNTER — Ambulatory Visit: Payer: Medicare Other

## 2020-08-29 DIAGNOSIS — R262 Difficulty in walking, not elsewhere classified: Secondary | ICD-10-CM

## 2020-08-29 DIAGNOSIS — R2681 Unsteadiness on feet: Secondary | ICD-10-CM

## 2020-08-29 DIAGNOSIS — M6281 Muscle weakness (generalized): Secondary | ICD-10-CM | POA: Diagnosis not present

## 2020-08-29 NOTE — Therapy (Signed)
[place] MAIN [facility] SERVICES [address] [place], [place], [address] Phone: [phone]   Fax:  [phone]  Physical Therapy Treatment/ Discharge  Patient Details  Name: Adriana Hughes MRN: [id number] Date of Birth: 1941-07-28 Referring Provider (PT): Derinda Late MD   Encounter Date: 08/29/2020   PT End of Session - 08/29/20 1305    Visit Number 14    Number of Visits 17    Date for PT Re-Evaluation 09/20/20    Authorization Type 4/10 PN 9/29    PT Start Time 1300    PT Stop Time 1344    PT Time Calculation (min) 44 min    Equipment Utilized During Treatment Gait belt    Activity Tolerance Patient tolerated treatment well    Behavior During Therapy [facility] for tasks assessed/performed           Past Medical History:  Diagnosis Date  . Arthritis   . Fibrocystic breast disease   . GERD (gastroesophageal reflux disease)   . Hyperlipidemia   . Hypertension     Past Surgical History:  Procedure Laterality Date  . APPENDECTOMY    . BREAST BIOPSY    . BREAST EXCISIONAL BIOPSY Bilateral [id number]   benign  . EYE SURGERY    . TEE WITHOUT CARDIOVERSION N/A 04/21/2018   Procedure: TRANSESOPHAGEAL ECHOCARDIOGRAM (TEE);  Surgeon: Corey Skains, MD;  Location: [facility] ORS;  Service: Cardiovascular;  Laterality: N/A;    There were no vitals filed for this visit.   Subjective Assessment - 08/29/20 1304    Subjective Patient notes feeling "weaker" today, as she has been taking care of her sister after a fall. Patient denies falls or LOB since last session.    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 Patient completed Physical therapy from Aug-Nov 2019; She finished therapy with good mobility, walking without assistive device. She reports over the last year she has been doing pretty well. Her physician recommended that she return to therapy for a reassesment to see if therapy could help with mobility.  She is not currently using any assistive device. She denies any falls in last 6 months. She denies any numbness/tingling; She is currently independent in all self care ADLs. She does some cooking. She is back to driving;    Limitations Standing    How long can you sit comfortably? NA    How long can you stand comfortably? 10-15 min with increased fatigue    How long can you walk comfortably? Pt reports she is walking approximately 1/2 mile 3-4 times a day;    Diagnostic tests none recent;    Patient Stated Goals "Be able to stand longer, improve activity tolerance."    Currently in Pain? No/denies           Treatment: Octane level 2, 4 minutes for BUE/BLE coordination. ROM >60 for cardiovascular training.   Goals: FOTO: 61% DGI: 23/24 Stairs: Patient navigates 4 steps without use of UE, reciprocal step pattern. HEP: Patient notes compliance with current HEP 3x/week. Notes that exercises are starting to get easy. MMT: LLE grossly 4+/5, RLE grossly 4/5    Access Code: [email or web address]  URL: [email or web address]  Date: 08/29/2020  Prepared by: Janna Arch  Exercises  Standing Tandem Balance with Counter Support - 1 x daily - 7 x weekly - 2 sets - 3 reps - 30 hold  Narrow Stance with Counter Support - 1 x daily - 7 x weekly -  2 sets - 3 reps - 30 hold  Heel Toe Raises with Counter Support - 1 x daily - 7 x weekly - 2 sets - 10 reps - 5 hold  Mini Squat with Counter Support - 1 x daily - 7 x weekly - 2 sets - 10 reps - 5 hold  Standing Hip Extension with Resistance at Ankles and Unilateral Counter Support - 1 x daily - 7 x weekly - 2 sets - 10 reps - 5 hold  Standing Hip Abduction with Resistance at Ankles and Counter Support - 1 x daily - 7 x weekly - 2 sets - 10 reps - 5 hold  Standing March with Counter Support - 1 x daily - 7 x weekly - 2 sets - 10 reps - 5 hold  Standing Single Leg Stance with Counter Support - 1 x daily - 7 x weekly - 2 sets - 3 reps - 30  hold                    PT Education - 08/29/20 1305    Education Details goals, HEP, exercise technique, body mechanics    Person(s) Educated Patient    Methods Demonstration;Explanation;Tactile cues;Verbal cues    Comprehension Tactile cues required;Verbalized understanding;Returned demonstration;Verbal cues required            PT Short Term Goals - 08/29/20 1450      PT SHORT TERM GOAL #1   Title Patient will be adherent to HEP at least 3x a week to improve functional strength and balance for better safety at home.    Baseline HEP compliant, 10/26: HEP performed 3x/week    Time 4    Period Weeks    Status Achieved    Target Date 08/23/20             PT Long Term Goals - 08/29/20 1452      PT LONG TERM GOAL #1   Title Patient will improve FOTO score to >65/100 to indicate improved functional mobility;    Baseline 9/22: 80.7%, 10/26: 61%    Time 8    Period Weeks    Status Achieved    Target Date 07/25/20      PT LONG TERM GOAL #2   Title Patient will ascend/descend 4 stairs without rail assist independently without loss of balance to improve ability to get in/out of home.     Baseline 9/22: ascend without rail, descend with rail reciprocating pattern, 10/26: reciprocal pattern without use of handrails, no LOB noted    Time 8    Period Weeks    Status Achieved    Target Date 09/20/20      PT LONG TERM GOAL #3   Title Patient will demonstrate an improved Berg Balance Score of > 53/56 as to demonstrate improved balance with ADLs such as sitting/standing and transfer balance and reduced fall risk    Baseline 9/22: 53/56    Time 8    Period Weeks    Status Achieved      PT LONG TERM GOAL #4   Title Patient will increase RLE gross strength to 4+/5 as to improve functional strength for independent gait, increased standing tolerance and increased ADL ability.    Baseline see note, 10/26: RLE grossly 4/5, LLE grossly 4+/5    Time 8    Period Weeks     Status Not Met    Target Date 09/20/20      PT LONG TERM GOAL #5  Title Patient will perform the DGI and increase score to >19/24 for reduced fall risk and to demonstrate improved gait mechanics and mobility.    Baseline 9/22: 17/24, 10/26: 23/24    Time 8    Period Weeks    Status Achieved    Target Date 09/20/20                 Plan - 08/29/20 1459    Clinical Impression Statement Patient has made great progress in balance, strength, and coordination during her time in skilled physical therapy. Patient has reduced her fall risk level, evidenced by 53/56 on Berg and 23/24 on DGI. RLE strength has improved from initial evaluation and is within functional limits for safe execution of mobility tasks. Patient is agreeable to being discharged this date due to progress in balance and return to baseline function. Patient does not express any concerns with her balance and strength and is agreeable to discharge. New HEP administered and demonstrated this date to include higher level LE strength and balance to maintain progress at home. I would be happy to treat this patient in the future if needed.    Personal Factors and Comorbidities Age;Comorbidity 3+;Time since onset of injury/illness/exacerbation    Comorbidities HTN, HLD, past stroke, GERD    Examination-Activity Limitations Stairs;Stand;Locomotion Level    Examination-Participation Restrictions Cleaning;Community Activity;Shop;Volunteer;Yard Work    Stability/Clinical Decision Making Stable/Uncomplicated    Rehab Potential Good    PT Frequency 1x / week    PT Duration 8 weeks    PT Treatment/Interventions Patient/family education;Balance training;Functional mobility training;Therapeutic activities;Therapeutic exercise;Gait training;Cryotherapy;Moist Heat;Stair training;Neuromuscular re-education;Energy conservation    PT Next Visit Plan RLE strength, dual task foot clearance RLE    PT Home Exercise Plan initiate next session with LE  strengthening- standing exercise    Consulted and Agree with Plan of Care Patient           Patient will benefit from skilled therapeutic intervention in order to improve the following deficits and impairments:  Abnormal gait, Decreased balance, Decreased endurance, Decreased mobility, Difficulty walking, Decreased activity tolerance, Decreased safety awareness, Decreased strength  Visit Diagnosis: Muscle weakness (generalized)  Difficulty in walking, not elsewhere classified  Unsteadiness on feet     Problem List There are no problems to display for this patient.  Tonny Bollman, SPT  This entire session was performed under direct supervision and direction of a licensed therapist/therapist assistant . I have personally read, edited and approve of the note as written.  Janna Arch, PT, DPT   08/29/2020, 3:00 PM  [place] MAIN [facility] SERVICES [address] [place], [place], [address] Phone: [phone]   Fax:  [phone]  Name: Adriana Hughes MRN: [id number] Date of Birth: [birth year]
